# Patient Record
Sex: Female | Born: 1950 | Race: Black or African American | Hispanic: No | Marital: Single | Smoking: Never smoker
Health system: Southern US, Community
[De-identification: ages and names within clinical notes are randomized; demographics above are authoritative.]

## PROBLEM LIST (undated history)

## (undated) VITALS — BP 149/104 | HR 100 | Temp 97.7°F | Resp 18 | Ht 67.0 in | Wt 263.0 lb

## (undated) DIAGNOSIS — E079 Disorder of thyroid, unspecified: Secondary | ICD-10-CM

## (undated) DIAGNOSIS — R32 Unspecified urinary incontinence: Secondary | ICD-10-CM

## (undated) DIAGNOSIS — D649 Anemia, unspecified: Secondary | ICD-10-CM

## (undated) DIAGNOSIS — J45909 Unspecified asthma, uncomplicated: Secondary | ICD-10-CM

## (undated) DIAGNOSIS — C50319 Malignant neoplasm of lower-inner quadrant of unspecified female breast: Secondary | ICD-10-CM

## (undated) DIAGNOSIS — Z8701 Personal history of pneumonia (recurrent): Secondary | ICD-10-CM

## (undated) DIAGNOSIS — M797 Fibromyalgia: Secondary | ICD-10-CM

## (undated) DIAGNOSIS — F319 Bipolar disorder, unspecified: Secondary | ICD-10-CM

## (undated) DIAGNOSIS — Z8709 Personal history of other diseases of the respiratory system: Secondary | ICD-10-CM

## (undated) DIAGNOSIS — E785 Hyperlipidemia, unspecified: Secondary | ICD-10-CM

## (undated) DIAGNOSIS — F329 Major depressive disorder, single episode, unspecified: Secondary | ICD-10-CM

## (undated) DIAGNOSIS — Z923 Personal history of irradiation: Secondary | ICD-10-CM

## (undated) DIAGNOSIS — G039 Meningitis, unspecified: Secondary | ICD-10-CM

## (undated) DIAGNOSIS — R350 Frequency of micturition: Secondary | ICD-10-CM

## (undated) DIAGNOSIS — F32A Depression, unspecified: Secondary | ICD-10-CM

## (undated) DIAGNOSIS — G4730 Sleep apnea, unspecified: Secondary | ICD-10-CM

## (undated) DIAGNOSIS — R3915 Urgency of urination: Secondary | ICD-10-CM

## (undated) DIAGNOSIS — G5600 Carpal tunnel syndrome, unspecified upper limb: Secondary | ICD-10-CM

## (undated) DIAGNOSIS — T7840XA Allergy, unspecified, initial encounter: Secondary | ICD-10-CM

## (undated) DIAGNOSIS — F419 Anxiety disorder, unspecified: Secondary | ICD-10-CM

## (undated) DIAGNOSIS — F4310 Post-traumatic stress disorder, unspecified: Secondary | ICD-10-CM

## (undated) DIAGNOSIS — I10 Essential (primary) hypertension: Secondary | ICD-10-CM

## (undated) DIAGNOSIS — M419 Scoliosis, unspecified: Secondary | ICD-10-CM

## (undated) DIAGNOSIS — K219 Gastro-esophageal reflux disease without esophagitis: Secondary | ICD-10-CM

## (undated) DIAGNOSIS — G5603 Carpal tunnel syndrome, bilateral upper limbs: Secondary | ICD-10-CM

## (undated) DIAGNOSIS — M5020 Other cervical disc displacement, unspecified cervical region: Secondary | ICD-10-CM

## (undated) DIAGNOSIS — Z8619 Personal history of other infectious and parasitic diseases: Secondary | ICD-10-CM

## (undated) DIAGNOSIS — M1990 Unspecified osteoarthritis, unspecified site: Secondary | ICD-10-CM

## (undated) HISTORY — DX: Personal history of other infectious and parasitic diseases: Z86.19

## (undated) HISTORY — PX: TUBAL LIGATION: SHX77

## (undated) HISTORY — DX: Major depressive disorder, single episode, unspecified: F32.9

## (undated) HISTORY — DX: Malignant neoplasm of lower-inner quadrant of unspecified female breast: C50.319

## (undated) HISTORY — PX: ABDOMINAL HYSTERECTOMY: SHX81

## (undated) HISTORY — PX: DILATION AND CURETTAGE OF UTERUS: SHX78

## (undated) HISTORY — DX: Scoliosis, unspecified: M41.9

## (undated) HISTORY — DX: Depression, unspecified: F32.A

## (undated) HISTORY — DX: Unspecified asthma, uncomplicated: J45.909

## (undated) HISTORY — PX: COLONOSCOPY: SHX174

## (undated) HISTORY — DX: Allergy, unspecified, initial encounter: T78.40XA

## (undated) HISTORY — PX: BREAST LUMPECTOMY: SHX2

## (undated) HISTORY — DX: Unspecified urinary incontinence: R32

## (undated) HISTORY — DX: Carpal tunnel syndrome, unspecified upper limb: G56.00

## (undated) HISTORY — DX: Hyperlipidemia, unspecified: E78.5

---

## 1994-03-07 DIAGNOSIS — Z8701 Personal history of pneumonia (recurrent): Secondary | ICD-10-CM

## 1994-03-07 HISTORY — DX: Personal history of pneumonia (recurrent): Z87.01

## 2007-11-06 ENCOUNTER — Emergency Department (HOSPITAL_COMMUNITY): Admission: EM | Admit: 2007-11-06 | Discharge: 2007-11-06 | Payer: Self-pay | Admitting: Emergency Medicine

## 2007-11-19 ENCOUNTER — Encounter: Admission: RE | Admit: 2007-11-19 | Discharge: 2007-11-19 | Payer: Self-pay | Admitting: Internal Medicine

## 2008-07-11 ENCOUNTER — Ambulatory Visit (HOSPITAL_COMMUNITY): Payer: Self-pay | Admitting: Licensed Clinical Social Worker

## 2008-07-30 ENCOUNTER — Ambulatory Visit (HOSPITAL_COMMUNITY): Payer: Self-pay | Admitting: Licensed Clinical Social Worker

## 2008-08-07 ENCOUNTER — Ambulatory Visit (HOSPITAL_COMMUNITY): Payer: Self-pay | Admitting: Licensed Clinical Social Worker

## 2008-08-12 ENCOUNTER — Ambulatory Visit (HOSPITAL_COMMUNITY): Payer: Self-pay | Admitting: Licensed Clinical Social Worker

## 2008-08-29 ENCOUNTER — Ambulatory Visit (HOSPITAL_COMMUNITY): Payer: Self-pay | Admitting: Psychiatry

## 2008-09-08 ENCOUNTER — Ambulatory Visit (HOSPITAL_COMMUNITY): Payer: Self-pay | Admitting: Licensed Clinical Social Worker

## 2008-09-17 ENCOUNTER — Ambulatory Visit (HOSPITAL_COMMUNITY): Payer: Self-pay | Admitting: Licensed Clinical Social Worker

## 2008-10-09 ENCOUNTER — Ambulatory Visit (HOSPITAL_COMMUNITY): Payer: Self-pay | Admitting: Licensed Clinical Social Worker

## 2008-10-16 ENCOUNTER — Ambulatory Visit (HOSPITAL_COMMUNITY): Payer: Self-pay | Admitting: Licensed Clinical Social Worker

## 2008-10-23 ENCOUNTER — Ambulatory Visit (HOSPITAL_COMMUNITY): Payer: Self-pay | Admitting: Licensed Clinical Social Worker

## 2008-10-27 ENCOUNTER — Ambulatory Visit (HOSPITAL_COMMUNITY): Payer: Self-pay | Admitting: Psychiatry

## 2008-10-30 ENCOUNTER — Ambulatory Visit (HOSPITAL_COMMUNITY): Payer: Self-pay | Admitting: Licensed Clinical Social Worker

## 2008-11-13 ENCOUNTER — Ambulatory Visit (HOSPITAL_COMMUNITY): Payer: Self-pay | Admitting: Licensed Clinical Social Worker

## 2008-11-24 ENCOUNTER — Ambulatory Visit (HOSPITAL_COMMUNITY): Payer: Self-pay | Admitting: Psychiatry

## 2008-11-27 ENCOUNTER — Ambulatory Visit (HOSPITAL_COMMUNITY): Payer: Self-pay | Admitting: Psychiatry

## 2008-12-26 ENCOUNTER — Inpatient Hospital Stay (HOSPITAL_COMMUNITY): Admission: AD | Admit: 2008-12-26 | Discharge: 2009-01-02 | Payer: Self-pay | Admitting: Psychiatry

## 2008-12-26 ENCOUNTER — Ambulatory Visit: Payer: Self-pay | Admitting: Psychiatry

## 2008-12-26 ENCOUNTER — Ambulatory Visit (HOSPITAL_COMMUNITY): Payer: Self-pay | Admitting: Licensed Clinical Social Worker

## 2009-01-16 ENCOUNTER — Ambulatory Visit (HOSPITAL_COMMUNITY): Payer: Self-pay | Admitting: Licensed Clinical Social Worker

## 2009-02-12 ENCOUNTER — Ambulatory Visit (HOSPITAL_COMMUNITY): Payer: Self-pay | Admitting: Licensed Clinical Social Worker

## 2009-05-22 ENCOUNTER — Ambulatory Visit (HOSPITAL_COMMUNITY): Payer: Self-pay | Admitting: Licensed Clinical Social Worker

## 2009-06-01 ENCOUNTER — Ambulatory Visit (HOSPITAL_COMMUNITY): Payer: Self-pay | Admitting: Psychiatry

## 2009-06-15 ENCOUNTER — Ambulatory Visit (HOSPITAL_COMMUNITY): Payer: Self-pay | Admitting: Licensed Clinical Social Worker

## 2009-07-16 ENCOUNTER — Ambulatory Visit (HOSPITAL_COMMUNITY): Payer: Self-pay | Admitting: Licensed Clinical Social Worker

## 2009-07-30 ENCOUNTER — Ambulatory Visit (HOSPITAL_COMMUNITY): Payer: Self-pay | Admitting: Licensed Clinical Social Worker

## 2009-09-30 ENCOUNTER — Ambulatory Visit (HOSPITAL_COMMUNITY): Payer: Self-pay | Admitting: Licensed Clinical Social Worker

## 2009-10-15 ENCOUNTER — Ambulatory Visit (HOSPITAL_COMMUNITY): Payer: Self-pay | Admitting: Licensed Clinical Social Worker

## 2009-10-30 ENCOUNTER — Ambulatory Visit (HOSPITAL_COMMUNITY): Payer: Self-pay | Admitting: Licensed Clinical Social Worker

## 2009-11-20 ENCOUNTER — Ambulatory Visit (HOSPITAL_COMMUNITY): Payer: Self-pay | Admitting: Licensed Clinical Social Worker

## 2009-11-25 ENCOUNTER — Ambulatory Visit (HOSPITAL_COMMUNITY): Payer: Self-pay | Admitting: Licensed Clinical Social Worker

## 2009-12-11 ENCOUNTER — Ambulatory Visit (HOSPITAL_COMMUNITY): Payer: Self-pay | Admitting: Licensed Clinical Social Worker

## 2009-12-25 ENCOUNTER — Ambulatory Visit (HOSPITAL_COMMUNITY): Payer: Self-pay | Admitting: Licensed Clinical Social Worker

## 2010-01-08 ENCOUNTER — Ambulatory Visit (HOSPITAL_COMMUNITY): Payer: Self-pay | Admitting: Licensed Clinical Social Worker

## 2010-01-15 ENCOUNTER — Ambulatory Visit (HOSPITAL_COMMUNITY): Payer: Self-pay | Admitting: Licensed Clinical Social Worker

## 2010-02-05 ENCOUNTER — Ambulatory Visit (HOSPITAL_COMMUNITY): Payer: Self-pay | Admitting: Licensed Clinical Social Worker

## 2010-02-19 ENCOUNTER — Ambulatory Visit (HOSPITAL_COMMUNITY): Payer: Self-pay | Admitting: Licensed Clinical Social Worker

## 2010-02-24 ENCOUNTER — Ambulatory Visit (HOSPITAL_COMMUNITY): Payer: Self-pay | Admitting: Licensed Clinical Social Worker

## 2010-03-12 ENCOUNTER — Ambulatory Visit (HOSPITAL_COMMUNITY): Admit: 2010-03-12 | Payer: Self-pay | Admitting: Licensed Clinical Social Worker

## 2010-03-19 ENCOUNTER — Ambulatory Visit (HOSPITAL_COMMUNITY)
Admission: RE | Admit: 2010-03-19 | Discharge: 2010-03-19 | Payer: Self-pay | Source: Home / Self Care | Attending: Licensed Clinical Social Worker | Admitting: Licensed Clinical Social Worker

## 2010-04-02 ENCOUNTER — Ambulatory Visit (HOSPITAL_COMMUNITY): Admit: 2010-04-02 | Payer: Self-pay | Admitting: Licensed Clinical Social Worker

## 2010-04-09 ENCOUNTER — Encounter (HOSPITAL_COMMUNITY): Payer: 59 | Admitting: Licensed Clinical Social Worker

## 2010-04-09 DIAGNOSIS — F315 Bipolar disorder, current episode depressed, severe, with psychotic features: Secondary | ICD-10-CM

## 2010-04-23 ENCOUNTER — Encounter (HOSPITAL_COMMUNITY): Payer: 59 | Admitting: Licensed Clinical Social Worker

## 2010-04-23 DIAGNOSIS — F315 Bipolar disorder, current episode depressed, severe, with psychotic features: Secondary | ICD-10-CM

## 2010-04-30 ENCOUNTER — Encounter (HOSPITAL_BASED_OUTPATIENT_CLINIC_OR_DEPARTMENT_OTHER): Payer: 59 | Admitting: Licensed Clinical Social Worker

## 2010-04-30 DIAGNOSIS — F315 Bipolar disorder, current episode depressed, severe, with psychotic features: Secondary | ICD-10-CM

## 2010-05-12 ENCOUNTER — Encounter (HOSPITAL_COMMUNITY): Payer: 59 | Admitting: Licensed Clinical Social Worker

## 2010-06-10 LAB — COMPREHENSIVE METABOLIC PANEL
ALT: 15 U/L (ref 0–35)
AST: 16 U/L (ref 0–37)
Albumin: 3.6 g/dL (ref 3.5–5.2)
CO2: 26 mEq/L (ref 19–32)
Calcium: 9.3 mg/dL (ref 8.4–10.5)
Chloride: 110 mEq/L (ref 96–112)
Creatinine, Ser: 0.86 mg/dL (ref 0.4–1.2)
GFR calc Af Amer: >60 mL/min (ref >60)
Sodium: 141 mEq/L (ref 135–145)

## 2010-06-10 LAB — CBC
MCHC: 32.3 g/dL (ref 30.0–36.0)
MCV: 88.7 fL (ref 78.0–100.0)
Platelets: 337 10*3/uL (ref 150–400)
RBC: 3.91 MIL/uL (ref 3.87–5.11)
WBC: 8.5 10*3/uL (ref 4.0–10.5)

## 2010-06-10 LAB — LITHIUM LEVEL: Lithium Lvl: 1.27 mEq/L (ref 0.80–1.40)

## 2010-07-23 ENCOUNTER — Encounter (HOSPITAL_COMMUNITY): Payer: 59 | Admitting: Licensed Clinical Social Worker

## 2010-07-23 DIAGNOSIS — F315 Bipolar disorder, current episode depressed, severe, with psychotic features: Secondary | ICD-10-CM

## 2010-08-23 ENCOUNTER — Encounter (HOSPITAL_COMMUNITY): Payer: 59 | Admitting: Licensed Clinical Social Worker

## 2010-09-17 ENCOUNTER — Encounter (HOSPITAL_COMMUNITY): Payer: 59 | Admitting: Licensed Clinical Social Worker

## 2010-11-17 ENCOUNTER — Encounter (HOSPITAL_COMMUNITY): Payer: 59 | Admitting: Licensed Clinical Social Worker

## 2010-11-17 DIAGNOSIS — F315 Bipolar disorder, current episode depressed, severe, with psychotic features: Secondary | ICD-10-CM

## 2010-12-08 LAB — POCT I-STAT, CHEM 8
Chloride: 110
HCT: 32 — ABNORMAL LOW
Hemoglobin: 10.9 — ABNORMAL LOW
Potassium: 3.9
Sodium: 141

## 2010-12-08 LAB — B-NATRIURETIC PEPTIDE (CONVERTED LAB): Pro B Natriuretic peptide (BNP): <30

## 2010-12-09 ENCOUNTER — Encounter (HOSPITAL_COMMUNITY): Payer: 59 | Admitting: Licensed Clinical Social Worker

## 2010-12-10 ENCOUNTER — Encounter (HOSPITAL_COMMUNITY): Payer: 59 | Admitting: Licensed Clinical Social Worker

## 2010-12-10 DIAGNOSIS — F3164 Bipolar disorder, current episode mixed, severe, with psychotic features: Secondary | ICD-10-CM

## 2010-12-31 ENCOUNTER — Encounter (HOSPITAL_COMMUNITY): Payer: 59 | Admitting: Licensed Clinical Social Worker

## 2011-01-05 ENCOUNTER — Encounter (INDEPENDENT_AMBULATORY_CARE_PROVIDER_SITE_OTHER): Payer: 59 | Admitting: Licensed Clinical Social Worker

## 2011-01-05 DIAGNOSIS — F315 Bipolar disorder, current episode depressed, severe, with psychotic features: Secondary | ICD-10-CM

## 2011-03-08 LAB — HM COLONOSCOPY

## 2011-03-08 LAB — HM PAP SMEAR

## 2011-03-11 ENCOUNTER — Ambulatory Visit (HOSPITAL_COMMUNITY): Payer: 59 | Admitting: Licensed Clinical Social Worker

## 2011-03-25 ENCOUNTER — Ambulatory Visit (HOSPITAL_COMMUNITY): Payer: 59 | Admitting: Licensed Clinical Social Worker

## 2011-04-01 ENCOUNTER — Ambulatory Visit (HOSPITAL_COMMUNITY): Payer: 59 | Admitting: Licensed Clinical Social Worker

## 2011-08-15 ENCOUNTER — Telehealth: Payer: Self-pay | Admitting: Obstetrics and Gynecology

## 2011-08-15 NOTE — Telephone Encounter (Signed)
Triage/cht received/pt last seen in 2010

## 2011-08-16 NOTE — Telephone Encounter (Signed)
TC to pt.   States is having pelvic and vaginal pressure and pain sometimes radiating to back x 1-2 months.    Was seen for eval by PCP 2 weeks ago.  Declines appt until 09/05/11 due to financial concerns.   Declined to speak with someone about payment arrangements.  Sched with Dr AVS 09/05/11.   To call if decides wants sooner appt.

## 2011-09-05 ENCOUNTER — Ambulatory Visit: Payer: Self-pay | Admitting: Obstetrics and Gynecology

## 2011-09-06 ENCOUNTER — Telehealth (HOSPITAL_COMMUNITY): Payer: Self-pay | Admitting: Licensed Clinical Social Worker

## 2011-09-06 NOTE — Telephone Encounter (Deleted)
Returned patients phone call. She called to explain why she has not been in therapy for an extended period of time, due to finances. She is severely depressed, with poor ADL's, increased isolation, hopelessness and helplessness. She endorses suicidal ideation, with a plan to overdose on pills. Recommend inpatient hospitalization, which patient agrees to. Discussed a plan involving her friend bringing her to the ED for evaluation and admission.

## 2011-09-06 NOTE — Telephone Encounter (Signed)
Returned patients phone call. She called to explain that she has not been in therapy due to finances. She is severely depressed, with poor ADL's, increased isolation, hopelessness and helplessness. She endorses suicidal ideation, with a plan to overdose on pills. Recommend inpatient hospitalization, which patient agrees to. Patients friend will bring patient to [facility] ED for evaluation and admission.

## 2011-09-07 ENCOUNTER — Telehealth (HOSPITAL_COMMUNITY): Payer: Self-pay | Admitting: Licensed Clinical Social Worker

## 2011-09-07 NOTE — Telephone Encounter (Signed)
Called patient to follow up. She did not go to the hospital after yesterdays conversation. She reports feeling somewhat better today, with an absence of suicidal ideation. She does still want to be admitted to target her severe depression and plans to come to be assessed on Friday, when a friend can bring her. Advised patient to go to [facility] ED if she is fearful of her own safety. Patient agrees. Advised patient to contact this therapist Friday morning about admission.

## 2011-09-09 ENCOUNTER — Encounter (HOSPITAL_COMMUNITY): Payer: Self-pay | Admitting: *Deleted

## 2011-09-09 ENCOUNTER — Emergency Department (HOSPITAL_COMMUNITY)
Admission: EM | Admit: 2011-09-09 | Discharge: 2011-09-09 | Disposition: A | Discharge status: Other Acute Inpatient Hospital | Payer: Medicare Other | Attending: Emergency Medicine | Admitting: Emergency Medicine

## 2011-09-09 ENCOUNTER — Inpatient Hospital Stay (HOSPITAL_COMMUNITY)
Admission: EM | Admit: 2011-09-09 | Discharge: 2011-09-17 | DRG: 885 | Disposition: A | Discharge status: Home / Self Care | Payer: 59 | Source: Ambulatory Visit | Attending: Psychiatry | Admitting: Psychiatry

## 2011-09-09 DIAGNOSIS — F259 Schizoaffective disorder, unspecified: Secondary | ICD-10-CM | POA: Diagnosis present

## 2011-09-09 DIAGNOSIS — Z79899 Other long term (current) drug therapy: Secondary | ICD-10-CM | POA: Insufficient documentation

## 2011-09-09 DIAGNOSIS — I10 Essential (primary) hypertension: Secondary | ICD-10-CM | POA: Insufficient documentation

## 2011-09-09 DIAGNOSIS — E079 Disorder of thyroid, unspecified: Secondary | ICD-10-CM | POA: Insufficient documentation

## 2011-09-09 DIAGNOSIS — F315 Bipolar disorder, current episode depressed, severe, with psychotic features: Principal | ICD-10-CM | POA: Diagnosis present

## 2011-09-09 DIAGNOSIS — M2550 Pain in unspecified joint: Secondary | ICD-10-CM | POA: Insufficient documentation

## 2011-09-09 DIAGNOSIS — F329 Major depressive disorder, single episode, unspecified: Secondary | ICD-10-CM | POA: Insufficient documentation

## 2011-09-09 DIAGNOSIS — E669 Obesity, unspecified: Secondary | ICD-10-CM | POA: Insufficient documentation

## 2011-09-09 DIAGNOSIS — F489 Nonpsychotic mental disorder, unspecified: Secondary | ICD-10-CM | POA: Diagnosis present

## 2011-09-09 DIAGNOSIS — G8929 Other chronic pain: Secondary | ICD-10-CM | POA: Insufficient documentation

## 2011-09-09 DIAGNOSIS — F4310 Post-traumatic stress disorder, unspecified: Secondary | ICD-10-CM

## 2011-09-09 DIAGNOSIS — F411 Generalized anxiety disorder: Secondary | ICD-10-CM | POA: Diagnosis present

## 2011-09-09 DIAGNOSIS — F3130 Bipolar disorder, current episode depressed, mild or moderate severity, unspecified: Secondary | ICD-10-CM

## 2011-09-09 DIAGNOSIS — IMO0001 Reserved for inherently not codable concepts without codable children: Secondary | ICD-10-CM | POA: Insufficient documentation

## 2011-09-09 DIAGNOSIS — M129 Arthropathy, unspecified: Secondary | ICD-10-CM | POA: Insufficient documentation

## 2011-09-09 DIAGNOSIS — IMO0002 Reserved for concepts with insufficient information to code with codable children: Secondary | ICD-10-CM

## 2011-09-09 DIAGNOSIS — F312 Bipolar disorder, current episode manic severe with psychotic features: Secondary | ICD-10-CM | POA: Diagnosis present

## 2011-09-09 DIAGNOSIS — F3289 Other specified depressive episodes: Secondary | ICD-10-CM | POA: Insufficient documentation

## 2011-09-09 DIAGNOSIS — R45851 Suicidal ideations: Secondary | ICD-10-CM | POA: Insufficient documentation

## 2011-09-09 HISTORY — DX: Carpal tunnel syndrome, bilateral upper limbs: G56.03

## 2011-09-09 HISTORY — DX: Post-traumatic stress disorder, unspecified: F43.10

## 2011-09-09 HISTORY — DX: Essential (primary) hypertension: I10

## 2011-09-09 HISTORY — DX: Anxiety disorder, unspecified: F41.9

## 2011-09-09 HISTORY — DX: Unspecified osteoarthritis, unspecified site: M19.90

## 2011-09-09 HISTORY — DX: Bipolar disorder, unspecified: F31.9

## 2011-09-09 HISTORY — DX: Fibromyalgia: M79.7

## 2011-09-09 HISTORY — DX: Disorder of thyroid, unspecified: E07.9

## 2011-09-09 LAB — RAPID URINE DRUG SCREEN, HOSP PERFORMED
Barbiturates: NOT DETECTED
Cocaine: NOT DETECTED
Tetrahydrocannabinol: NOT DETECTED

## 2011-09-09 LAB — COMPREHENSIVE METABOLIC PANEL
ALT: 10 U/L (ref 0–35)
Alkaline Phosphatase: 109 U/L (ref 39–117)
CO2: 26 mEq/L (ref 19–32)
Chloride: 108 mEq/L (ref 96–112)
GFR calc Af Amer: >90 mL/min (ref >90)
Glucose, Bld: 92 mg/dL (ref 70–99)
Potassium: 3.5 mEq/L (ref 3.5–5.1)
Sodium: 142 mEq/L (ref 135–145)
Total Bilirubin: 0.2 mg/dL — ABNORMAL LOW (ref 0.3–1.2)
Total Protein: 7.7 g/dL (ref 6.0–8.3)

## 2011-09-09 LAB — CBC
HCT: 33.1 % — ABNORMAL LOW (ref 36.0–46.0)
Hemoglobin: 10.3 g/dL — ABNORMAL LOW (ref 12.0–15.0)
RBC: 3.81 MIL/uL — ABNORMAL LOW (ref 3.87–5.11)
WBC: 7 10*3/uL (ref 4.0–10.5)

## 2011-09-09 LAB — LITHIUM LEVEL: Lithium Lvl: 0.37 mEq/L — ABNORMAL LOW (ref 0.80–1.40)

## 2011-09-09 MED ORDER — LORAZEPAM 1 MG PO TABS: 1.0000 mg | ORAL_TABLET | Freq: Three times a day (TID) | ORAL | Status: DC | PRN

## 2011-09-09 MED ORDER — IBUPROFEN 200 MG PO TABS: 600.0000 mg | ORAL_TABLET | Freq: Three times a day (TID) | ORAL | Status: DC | PRN

## 2011-09-09 MED ORDER — ONDANSETRON HCL 4 MG PO TABS: 4.0000 mg | ORAL_TABLET | Freq: Three times a day (TID) | ORAL | Status: DC | PRN

## 2011-09-09 MED ORDER — AMLODIPINE BESYLATE 10 MG PO TABS
10.0000 mg | ORAL_TABLET | Freq: Every day | ORAL | Status: DC
  Administered 2011-09-09: 10 mg via ORAL
  Filled 2011-09-09: qty 1

## 2011-09-09 MED ORDER — LITHIUM CARBONATE 300 MG PO CAPS
600.0000 mg | ORAL_CAPSULE | Freq: Two times a day (BID) | ORAL | Status: DC
  Administered 2011-09-09 (×2): 600 mg via ORAL
  Filled 2011-09-09 (×2): qty 2

## 2011-09-09 MED ORDER — PANTOPRAZOLE SODIUM 40 MG PO TBEC
40.0000 mg | DELAYED_RELEASE_TABLET | Freq: Every day | ORAL | Status: DC
  Administered 2011-09-09: 40 mg via ORAL
  Filled 2011-09-09: qty 1

## 2011-09-09 MED ORDER — TRAMADOL HCL 50 MG PO TABS: 50.0000 mg | ORAL_TABLET | Freq: Four times a day (QID) | ORAL | Status: DC | PRN

## 2011-09-09 MED ORDER — ZOLPIDEM TARTRATE 5 MG PO TABS: 5.0000 mg | ORAL_TABLET | Freq: Every evening | ORAL | Status: DC | PRN

## 2011-09-09 NOTE — ED Notes (Signed)
Pt friend [name] would like to be called as needed- [phone] or cell [phone]

## 2011-09-09 NOTE — ED Notes (Signed)
Patient discharge to [facility] with security and nurse tech [place] via wheelchair with cane. Respirations equal and unlabored. Skin warm and dry. No acute distress noted.

## 2011-09-09 NOTE — ED Notes (Signed)
Sitter at bedside.

## 2011-09-09 NOTE — ED Notes (Signed)
Pt reports history of bipolar disorder, PTSD and anxiety. Pt reports psychiatric issues worse in the last two months. Pt reports she felt manic about three weeks ago and has since felt depressed. Pt sees PCP at [facility], but has not seen them in several months secondary to financial issues. Pt sees MD [place] Dragon for psychiatry, but has been unable to see her as she has financial issues. Patient was taken off Cymbalta and Lyrica secondary to a reaction several months ago.  Pt reports she believes that is when her psychiatric issues became worse.  Pt reports SI/HI.  Pt reports she has a plan- patient is going to clean the house, write a letter to friends and family and then to overdose on pills.

## 2011-09-09 NOTE — Progress Notes (Signed)
Black purse x1 placed in locker #36 for pt/family member per CNA

## 2011-09-09 NOTE — ED Provider Notes (Addendum)
History     CSN: [id number]  Arrival date & time 09/09/11  1152   First MD Initiated Contact with Patient 09/09/11 1258      Chief Complaint  Patient presents with  . Medical Clearance  . Suicidal    (Consider location/radiation/quality/duration/timing/severity/associated sxs/prior treatment) HPI Comments: Pt is a 61 yo female who presents to ED with complaints of depression, anxiety, SI and HI. States does not have a plan for either. States cannot control her emotions. Pt states she spoke with her therapist who is working on getting to to [employer]. Pt denies drugs or alcohol. Reports chronic pain, for which she takes tramadol for. No other complaints. States has been out of some of her medications for few months because unable to pay. Does not volunteer why she is feeling more depressed, states "just a lot going on."    Past Medical History  Diagnosis Date  . PTSD (post-traumatic stress disorder)   . Anxiety   . Fibromyalgia   . Bipolar disorder   . Arthritis   . Hypertension   . Thyroid disease     Past Surgical History  Procedure Date  . Tubal ligation   . Abdominal hysterectomy   . Breast lumpectomy     No family history on file.  History  Substance Use Topics  . Smoking status: Never Smoker   . Smokeless tobacco: Not on file  . Alcohol Use: No    OB History    Grav Para Term Preterm Abortions TAB SAB Ect Mult Living                  Review of Systems  Constitutional: Negative for fever and chills.  Respiratory: Negative.   Cardiovascular: Negative.   Gastrointestinal: Negative.   Genitourinary: Negative for dysuria.  Musculoskeletal: Positive for myalgias and arthralgias.  Skin: Negative.   Neurological: Negative for weakness and numbness.  Psychiatric/Behavioral: Positive for suicidal ideas. The patient is nervous/anxious.        Positive for depression    Allergies  Ace inhibitors and Decongestant  Home Medications   Current Outpatient Rx   Name Route Sig Dispense Refill  . ALBUTEROL SULFATE HFA 108 (90 BASE) MCG/ACT IN AERS Inhalation Inhale 2 puffs into the lungs daily.    Marland Kitchen AMLODIPINE BESYLATE 10 MG PO TABS Oral Take 10 mg by mouth daily.    . ASENAPINE MALEATE 5 MG SL SUBL Sublingual Place 5 mg under the tongue daily.    . CHOLINE FENOFIBRATE 135 MG PO CPDR Oral Take 135 mg by mouth daily.    Marland Kitchen DIAZEPAM 10 MG PO TABS Oral Take 10 mg by mouth every 6 (six) hours as needed. For anxiety    . DICLOFENAC SODIUM 1.5 % TD SOLN Transdermal Place 1 drop onto the skin 2 (two) times daily. Apply to knees, back, and hip    . ESOMEPRAZOLE MAGNESIUM 40 MG PO CPDR Oral Take 40 mg by mouth daily before breakfast.    . FEXOFENADINE HCL 180 MG PO TABS Oral Take 180 mg by mouth daily.    Marland Kitchen FLUTICASONE PROPIONATE 50 MCG/ACT NA SUSP Nasal Place 2 sprays into the nose daily.    Marland Kitchen LITHIUM CARBONATE 300 MG PO TABS Oral Take 600 mg by mouth 2 (two) times daily.    Marland Kitchen SOLIFENACIN SUCCINATE 5 MG PO TABS Oral Take 10 mg by mouth daily.    . TRAMADOL HCL 50 MG PO TABS Oral Take 50 mg by mouth every 6 (  six) hours as needed. For pain      BP 150/83  Pulse 83  Temp 98.5 F (36.9 C) (Oral)  Resp 18  SpO2 98%  Physical Exam  Nursing note and vitals reviewed. Constitutional: She is oriented to person, place, and time. She appears well-developed and well-nourished. No distress.       obese  HENT:  Head: Normocephalic and atraumatic.  Eyes: Conjunctivae are normal.  Neck: Neck supple.  Cardiovascular: Normal rate, regular rhythm and normal heart sounds.   Pulmonary/Chest: Effort normal and breath sounds normal. No respiratory distress. She has no wheezes. She has no rales.  Abdominal: Soft. Bowel sounds are normal. She exhibits no distension. There is no tenderness.  Musculoskeletal: Normal range of motion.  Neurological: She is alert and oriented to person, place, and time. No cranial nerve deficit. Coordination normal.  Skin: Skin is warm and dry.   Psychiatric: Her behavior is normal. Thought content normal.       Calm, cooperative, flat affect    ED Course  Procedures (including critical care time)  Pt with SI and some HI. Depression. Hx of PTSD, anxiety, bipolar. Will get labs, will monitor.   Results for orders placed during the hospital encounter of 09/09/11  CBC      Component Value Range   WBC 7.0  4.0 - 10.5 K/uL   RBC 3.81 (*) 3.87 - 5.11 MIL/uL   Hemoglobin 10.3 (*) 12.0 - 15.0 g/dL   HCT 16.1 (*) 09.6 - 04.5 %   MCV 86.9  78.0 - 100.0 fL   MCH 27.0  26.0 - 34.0 pg   MCHC 31.1  30.0 - 36.0 g/dL   RDW 40.9  81.1 - 91.4 %   Platelets 330  150 - 400 K/uL  COMPREHENSIVE METABOLIC PANEL      Component Value Range   Sodium 142  135 - 145 mEq/L   Potassium 3.5  3.5 - 5.1 mEq/L   Chloride 108  96 - 112 mEq/L   CO2 26  19 - 32 mEq/L   Glucose, Bld 92  70 - 99 mg/dL   BUN 16  6 - 23 mg/dL   Creatinine, Ser 7.82  0.50 - 1.10 mg/dL   Calcium 9.5  8.4 - 95.6 mg/dL   Total Protein 7.7  6.0 - 8.3 g/dL   Albumin 3.5  3.5 - 5.2 g/dL   AST 14  0 - 37 U/L   ALT 10  0 - 35 U/L   Alkaline Phosphatase 109  39 - 117 U/L   Total Bilirubin 0.2 (*) 0.3 - 1.2 mg/dL   GFR calc non Af Amer 79 (*) >90 mL/min   GFR calc Af Amer >90  >90 mL/min  ETHANOL      Component Value Range   Alcohol, Ethyl (B) <11  0 - 11 mg/dL  ACETAMINOPHEN LEVEL      Component Value Range   Acetaminophen (Tylenol), Serum <15.0  10 - 30 ug/mL  URINE RAPID DRUG SCREEN (HOSP PERFORMED)      Component Value Range   Opiates NONE DETECTED  NONE DETECTED   Cocaine NONE DETECTED  NONE DETECTED   Benzodiazepines POSITIVE (*) NONE DETECTED   Amphetamines NONE DETECTED  NONE DETECTED   Tetrahydrocannabinol NONE DETECTED  NONE DETECTED   Barbiturates NONE DETECTED  NONE DETECTED  LITHIUM LEVEL      Component Value Range   Lithium Lvl 0.37 (*) 0.80 - 1.40 mEq/L   3:54 PM Pt  medically cleared. I spoke with ACT team. Will come assess. Her VS are stable, she is  currently nad, SI present.    Date: 11/26/2011  Rate: 80  Rhythm: normal sinus rhythm  QRS Axis: left  Intervals: normal  ST/T Wave abnormalities: normal  Conduction Disutrbances:nonspecific intraventricular conduction delay  Narrative Interpretation:   Old EKG Reviewed: none available     1. Depression   2. Suicidal ideations       MDM          Lottie Mussel, PA 09/10/11 1513  Lottie Mussel, [place] 11/26/11 1338

## 2011-09-09 NOTE — ED Notes (Signed)
Safety sitter at bedside 
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
## 2011-09-10 ENCOUNTER — Encounter (HOSPITAL_COMMUNITY): Payer: Self-pay

## 2011-09-10 DIAGNOSIS — F1994 Other psychoactive substance use, unspecified with psychoactive substance-induced mood disorder: Secondary | ICD-10-CM

## 2011-09-10 DIAGNOSIS — F070 Personality change due to known physiological condition: Secondary | ICD-10-CM

## 2011-09-10 DIAGNOSIS — F4310 Post-traumatic stress disorder, unspecified: Secondary | ICD-10-CM

## 2011-09-10 DIAGNOSIS — F3130 Bipolar disorder, current episode depressed, mild or moderate severity, unspecified: Secondary | ICD-10-CM

## 2011-09-10 DIAGNOSIS — F259 Schizoaffective disorder, unspecified: Secondary | ICD-10-CM

## 2011-09-10 MED ORDER — ASENAPINE MALEATE 5 MG SL SUBL: 5.0000 mg | SUBLINGUAL_TABLET | SUBLINGUAL | Status: DC | PRN

## 2011-09-10 MED ORDER — PANTOPRAZOLE SODIUM 40 MG PO TBEC
40.0000 mg | DELAYED_RELEASE_TABLET | Freq: Every day | ORAL | Status: DC
  Administered 2011-09-10 – 2011-09-12 (×3): 40 mg via ORAL
  Filled 2011-09-10 (×4): qty 1

## 2011-09-10 MED ORDER — DULOXETINE HCL 20 MG PO CPEP
20.0000 mg | ORAL_CAPSULE | Freq: Two times a day (BID) | ORAL | Status: DC
  Administered 2011-09-10 – 2011-09-12 (×4): 20 mg via ORAL
  Filled 2011-09-10 (×6): qty 1

## 2011-09-10 MED ORDER — DARIFENACIN HYDROBROMIDE ER 7.5 MG PO TB24
7.5000 mg | ORAL_TABLET | Freq: Every day | ORAL | Status: DC
  Administered 2011-09-10 – 2011-09-17 (×8): 7.5 mg via ORAL
  Filled 2011-09-10 (×10): qty 1

## 2011-09-10 MED ORDER — ALBUTEROL SULFATE HFA 108 (90 BASE) MCG/ACT IN AERS
2.0000 | INHALATION_SPRAY | Freq: Every day | RESPIRATORY_TRACT | Status: DC
  Administered 2011-09-10 – 2011-09-17 (×6): 2 via RESPIRATORY_TRACT
  Filled 2011-09-10: qty 6.7

## 2011-09-10 MED ORDER — OLANZAPINE 10 MG PO TABS
10.0000 mg | ORAL_TABLET | Freq: Every day | ORAL | Status: DC
  Administered 2011-09-10 – 2011-09-16 (×6): 10 mg via ORAL
  Filled 2011-09-10 (×3): qty 1
  Filled 2011-09-10: qty 3
  Filled 2011-09-10 (×6): qty 1

## 2011-09-10 MED ORDER — LORATADINE 10 MG PO TABS
10.0000 mg | ORAL_TABLET | Freq: Every day | ORAL | Status: DC
  Administered 2011-09-10 – 2011-09-17 (×8): 10 mg via ORAL
  Filled 2011-09-10 (×10): qty 1

## 2011-09-10 MED ORDER — ACETAMINOPHEN 325 MG PO TABS
650.0000 mg | ORAL_TABLET | Freq: Four times a day (QID) | ORAL | Status: DC | PRN
  Administered 2011-09-11 – 2011-09-12 (×2): 650 mg via ORAL
  Filled 2011-09-10: qty 2

## 2011-09-10 MED ORDER — AMLODIPINE BESYLATE 10 MG PO TABS
10.0000 mg | ORAL_TABLET | Freq: Every day | ORAL | Status: DC
  Administered 2011-09-10 – 2011-09-17 (×8): 10 mg via ORAL
  Filled 2011-09-10 (×3): qty 1
  Filled 2011-09-10: qty 2
  Filled 2011-09-10 (×2): qty 1
  Filled 2011-09-10: qty 2
  Filled 2011-09-10 (×3): qty 1
  Filled 2011-09-10: qty 3
  Filled 2011-09-10: qty 1

## 2011-09-10 MED ORDER — MELOXICAM 7.5 MG PO TABS
7.5000 mg | ORAL_TABLET | Freq: Two times a day (BID) | ORAL | Status: DC
  Administered 2011-09-10 – 2011-09-17 (×15): 7.5 mg via ORAL
  Filled 2011-09-10 (×4): qty 1
  Filled 2011-09-10: qty 6
  Filled 2011-09-10 (×6): qty 1
  Filled 2011-09-10: qty 6
  Filled 2011-09-10 (×7): qty 1

## 2011-09-10 MED ORDER — MELOXICAM 7.5 MG PO TABS: 7.5000 mg | ORAL_TABLET | Freq: Every day | ORAL | Status: DC

## 2011-09-10 MED ORDER — GABAPENTIN 100 MG PO CAPS
200.0000 mg | ORAL_CAPSULE | Freq: Four times a day (QID) | ORAL | Status: DC
  Administered 2011-09-10 – 2011-09-12 (×7): 200 mg via ORAL
  Filled 2011-09-10 (×10): qty 2

## 2011-09-10 MED ORDER — FERROUS SULFATE 325 (65 FE) MG PO TABS
325.0000 mg | ORAL_TABLET | Freq: Two times a day (BID) | ORAL | Status: DC
  Administered 2011-09-10 – 2011-09-12 (×5): 325 mg via ORAL
  Administered 2011-09-13: 324 mg via ORAL
  Administered 2011-09-13 – 2011-09-17 (×8): 325 mg via ORAL
  Filled 2011-09-10 (×9): qty 1
  Filled 2011-09-10: qty 6
  Filled 2011-09-10 (×7): qty 1
  Filled 2011-09-10: qty 6

## 2011-09-10 MED ORDER — FLUTICASONE PROPIONATE 50 MCG/ACT NA SUSP
2.0000 | Freq: Every day | NASAL | Status: DC
  Administered 2011-09-10 – 2011-09-17 (×6): 2 via NASAL
  Filled 2011-09-10: qty 16

## 2011-09-10 MED ORDER — TRAZODONE HCL 100 MG PO TABS
100.0000 mg | ORAL_TABLET | Freq: Every evening | ORAL | Status: DC | PRN
  Administered 2011-09-10: 100 mg via ORAL
  Filled 2011-09-10: qty 3
  Filled 2011-09-10: qty 1

## 2011-09-10 MED ORDER — LITHIUM CARBONATE 300 MG PO CAPS
600.0000 mg | ORAL_CAPSULE | Freq: Two times a day (BID) | ORAL | Status: DC
  Administered 2011-09-10 – 2011-09-15 (×11): 600 mg via ORAL
  Filled 2011-09-10 (×15): qty 2

## 2011-09-10 MED ORDER — GABAPENTIN 100 MG PO CAPS
100.0000 mg | ORAL_CAPSULE | Freq: Four times a day (QID) | ORAL | Status: AC
  Administered 2011-09-10 (×2): 100 mg via ORAL
  Filled 2011-09-10 (×2): qty 1

## 2011-09-10 MED ORDER — PRAZOSIN HCL 1 MG PO CAPS
1.0000 mg | ORAL_CAPSULE | Freq: Every evening | ORAL | Status: DC | PRN
  Administered 2011-09-10 – 2011-09-11 (×2): 1 mg via ORAL
  Filled 2011-09-10 (×6): qty 1

## 2011-09-10 MED ORDER — DIAZEPAM 5 MG PO TABS
10.0000 mg | ORAL_TABLET | ORAL | Status: AC
  Administered 2011-09-10: 10 mg via ORAL
  Filled 2011-09-10: qty 2

## 2011-09-10 MED ORDER — MAGNESIUM HYDROXIDE 400 MG/5ML PO SUSP: 30.0000 mL | Freq: Every day | ORAL | Status: DC | PRN

## 2011-09-10 NOTE — Progress Notes (Signed)
Psychoeducational Group Note  Date:  09/10/2011 Time:  1015  Group Topic/Focus:  Identifying Needs:   The focus of this group is to help patients identify their personal needs that have been historically problematic and identify healthy behaviors to address their needs.  Participation Level:  Did Not Attend  Participation Quality:    Affect:    Cognitive:     Insight:    Engagement in Group:    Additional Comments: Pt was with MD during group. Material and workbook reviewed with pt afterwards.   Candance Bohlman Shari Prows 09/10/2011, 11:13 AM

## 2011-09-10 NOTE — H&P (Signed)
Medical/psychiatric screening examination/treatment/procedure(s) were performed by non-physician practitioner and as supervising physician I was immediately available for consultation/collaboration.  I have seen and examined this patient and agree the major elements of this evaluation.  

## 2011-09-10 NOTE — Progress Notes (Signed)
Pt admitted due to depression and SI. Pt has been having frequent thoughts of SI for the past week. Pt had a plan to OD and to individually write an suicide letter to be given to each one of her daughters and grandchildren. Pt even talked about cleaning her home before OD. Pt said in the past she attempted to OD but her daughter caught her before she could execute her plan. Pt feels abandoned by family. She says they are too busy with their own lives to spend any time with her. Pt has four daughters and several grandchildren. Pt reports two of her daughters as supportive. During our discussion of her PTA medications, pt reports that her physician discontinued her Lyrica and cymbalta due to an allergic reaction. Pt reports swelling as a reaction, with significant swelling of her lips. However pt reported that her physician was to gradually reintroduce these medications . Pt reports that Lyrica and cymbalta where the best medications that ever worked for her. Pt believes that being off of these two meds without the proper replacement has increased her depression. Pt has a PMH of arthritis, HTN, Thyroid disorder (pt unsure of which one, but it is more than likely hypothyroidism), and bilateral carpel tunnel syndrome. Pt is currently using a wheelchair and walker due to unsteady gait r/t her arthritis and fibromyalgia. Pt can walk with a cane, but reports the need for a wheelchair for long distances or frequent walking. Pt is currently passive for SI and contracts for safety. Pt remains safe at this time with q27min checks.
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
## 2011-09-10 NOTE — Progress Notes (Signed)
Psychoeducational Group Note  Date:  09/10/2011 Time:  1515  Group Topic/Focus:  Making Healthy Choices:   The focus of this group is to help patients identify negative/unhealthy choices they were using prior to admission and identify positive/healthier coping strategies to replace them upon discharge.  Participation Level:  Active  Participation Quality:  Appropriate  Affect:  Appropriate  Cognitive:  Appropriate  Insight:  Good  Engagement in Group:  Good  Additional Comments:  none  Samona Chihuahua M 09/10/2011, 5:01 PM

## 2011-09-10 NOTE — Tx Team (Signed)
Initial Interdisciplinary Treatment Plan  PATIENT STRENGTHS: (choose at least two) Ability for insight Average or above average intelligence General fund of knowledge Motivation for treatment/growth Supportive family/friends  PATIENT STRESSORS: Financial difficulties Health problems Loss of 3 friends within the same yr (may-aug 2012)* Medication change or noncompliance The lack of time that other family members are available to spend with this pt.    PROBLEM LIST: Problem List/Patient Goals Date to be addressed Date deferred Reason deferred Estimated date of resolution  Increased risk for SI 7/6     Depression 7/6     Anxiety 7/6     Loss of friends.  Pt reports that her family are to busy with their marriages, kids, and so forth to spend time with her. Pt reporting "loneliness".  7/6                                    DISCHARGE CRITERIA:  Improved stabilization in mood, thinking, and/or behavior Medical problems require only outpatient monitoring Motivation to continue treatment in a less acute level of care Need for constant or close observation no longer present Reduction of life-threatening or endangering symptoms to within safe limits Verbal commitment to aftercare and medication compliance  PRELIMINARY DISCHARGE PLAN: Attend PHP/IOP  PATIENT/FAMIILY INVOLVEMENT: This treatment plan has been presented to and reviewed with the patient, Caitlyn Bautista.  The patient and family have been given the opportunity to ask questions and make suggestions.  Dottie Vaquerano A 09/10/2011, 3:48 AM

## 2011-09-10 NOTE — Progress Notes (Signed)
[facility] Group Notes:  (Counselor/Nursing/MHT/Case Management/Adjunct)  09/10/2011 5:56 PM  Type of Therapy:  Group Therapy  Participation Level:  Active  Participation Quality:  Appropriate and Attentive  Affect:  Appropriate  Cognitive:  Appropriate  Insight:  Good  Engagement in Group:  Good  Engagement in Therapy:  Good  Modes of Intervention:  Clarification, Education, Socialization and Support  Summary of Progress/Problems: Pt. participated in group on self sabotaging behaviors and enabling. Each pt. Shared how they self sabotage and what the word means to them. Each pt. Spoke also discussed enabling them selves or "empowering" them selves to make positive changes in order to avoid their self sabotaging behaviors.  Pt. Spoke about her problems with SI thoughts and not being able to accomplish her goals. Pt. Spoke about life experiences and her struggles with her pain and health issues. Pt. Also spoke about it hard to make changes but spoke about being positive and changing the way she thinks.   Neila Gear 09/10/2011, 5:56 PM

## 2011-09-10 NOTE — Progress Notes (Signed)
D - Pt in dayroom interacting appropriately with other patients. Pt attended group tonight with some interaction, is cooperative with staff. Pt mood is sad, continues to have passive suicidal thoughts, contracts for safety and is open to help offered . Pt was concerned about not getting enough sleep because she wakes up frequently.  A - Medications given as ordered by MD including sleep medications. Encouraged pt to interact with group and to seek answers to any questions or concerns. Offered therapeutic conversation to assist pt with her concerns. Continue Q 15 min rounds for safety.  R - Pt safety maintained on the unit. Pt continues to verbally contract for safety.

## 2011-09-10 NOTE — Progress Notes (Signed)
Patient ID: Caitlyn Bautista, female   DOB: [birth year], 61 y.o.   MRN: [id number] D: Pt is awake and active on the unit this AM. Pt denies A/V hallucinations but endorses passive suicide ideation. Pt is participating in the milieu and is cooperative with staff. Pt is inquiring about support groups for pts with bipolar disorder and ptsd. Pt states that she wakes up frequently during the night.   A: Writer offered self, utilized therapeutic communication and administered medication per MD orders. Writer followed up with counselor for programs and encouraged pt to utilize the Internet for resources. Pt also c/o difficulty with obtaining medications due to cost.   R: Pt is able to verbally contract for safety. Pt writes that she intends to associate more with others and not cut herself off. Pt also wants to learn more about managing her illness. Writer provided medication education. Writer will continue to monitor. 15 minute checks ongoing for safety.
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
## 2011-09-10 NOTE — Progress Notes (Signed)
[facility] MD Progress Note  09/10/2011 10:09 AM  Diagnosis:   Axis I: Bipolar, Depressed, Post Traumatic Stress Disorder, Substance Induced Mood Disorder and Cognitive changes and late onset Bipolar Disorder in person with signinficant arthritities. Axis II: Deferred Axis III:  Past Medical History  Diagnosis Date  . PTSD (post-traumatic stress disorder)   . Anxiety   . Fibromyalgia   . Bipolar disorder   . Arthritis   . Hypertension   . Thyroid disease   . Carpal tunnel syndrome, bilateral    Axis IV: other psychosocial or environmental problems, problems with access to health care services and problems with primary support group Axis V: 41-50 serious symptoms  ADL's:  Impaired  Sleep: Poor  Appetite:  Poor  Suicidal Ideation:  Pt denies any thoughts, plans, intent of suicide this moment, but did have suicidal thoughts when she woke up this AM. Homicidal Ideation:  Pt denies any thoughts, plans, intent of homicide  Mental Status Examination/Evaluation: Objective:  Appearance: Casual  Eye Contact::  Good  Speech:  Clear and Coherent  Volume:  Normal  Mood:  Anxious, Depressed, Hopeless, Irritable and Worthless  Affect:  Congruent  Thought Process:  Coherent  Orientation:  Other:  off from date by one day  Thought Content:  Hallucinations: Auditory feel presence of things in the house at times.  Suicidal Thoughts:  Yes.  without intent/plan  Homicidal Thoughts:  No  Memory:  Immediate;   Poor Recent;   Fair Remote;   Fair  Judgement:  Impaired  Insight:  Fair  Psychomotor Activity:  Normal  Concentration:  Fair  Recall:  Fair  Akathisia:  No  Handed:  Left  AIMS (if indicated):     Assets:  Communication Skills Desire for Improvement  Sleep:  Number of Hours: 3.75    Vital Signs:Blood pressure 133/83, pulse 76, temperature 96.6 F (35.9 C), temperature source Oral, resp. rate 18, height 5\' 7"  (1.702 m), weight 119.296 kg (263 lb). Current Medications: Current  Facility-Administered Medications  Medication Dose Route Frequency Provider Last Rate Last Dose  . acetaminophen (TYLENOL) tablet 650 mg  650 mg Oral Q6H PRN Curlene Labrum Readling, MD      . amLODipine (NORVASC) tablet 10 mg  10 mg Oral Daily Curlene Labrum Readling, MD   10 mg at 09/10/11 0702  . diazepam (VALIUM) tablet 10 mg  10 mg Oral NOW Curlene Labrum Readling, MD   10 mg at 09/10/11 0703  . magnesium hydroxide (MILK OF MAGNESIA) suspension 30 mL  30 mL Oral Daily PRN Curlene Labrum Readling, MD      . traZODone (DESYREL) tablet 100 mg  100 mg Oral QHS PRN Ronny Bacon, MD       Facility-Administered Medications Ordered in Other Encounters  Medication Dose Route Frequency Provider Last Rate Last Dose  . DISCONTD: amLODipine (NORVASC) tablet 10 mg  10 mg Oral Daily Tatyana A Kirichenko, PA   10 mg at 09/09/11 1506  . DISCONTD: ibuprofen (ADVIL,MOTRIN) tablet 600 mg  600 mg Oral Q8H PRN Tatyana A Kirichenko, PA      . DISCONTD: lithium carbonate capsule 600 mg  600 mg Oral BID Tatyana A Kirichenko, PA   600 mg at 09/09/11 2143  . DISCONTD: LORazepam (ATIVAN) tablet 1 mg  1 mg Oral Q8H PRN Tatyana A Kirichenko, PA      . DISCONTD: ondansetron (ZOFRAN) tablet 4 mg  4 mg Oral Q8H PRN Tatyana A Kirichenko, PA      .  DISCONTD: pantoprazole (PROTONIX) EC tablet 40 mg  40 mg Oral Daily Tatyana A Kirichenko, PA   40 mg at 09/09/11 1421  . DISCONTD: traMADol (ULTRAM) tablet 50 mg  50 mg Oral Q6H PRN Tatyana A Kirichenko, PA      . DISCONTD: zolpidem (AMBIEN) tablet 5 mg  5 mg Oral QHS PRN Lottie Mussel, PA        Lab Results:  Results for orders placed during the hospital encounter of 09/09/11 (from the past 48 hour(s))  URINE RAPID DRUG SCREEN (HOSP PERFORMED)     Status: Abnormal   Collection Time   09/09/11 12:49 PM      Component Value Range Comment   Opiates NONE DETECTED  NONE DETECTED    Cocaine NONE DETECTED  NONE DETECTED    Benzodiazepines POSITIVE (*) NONE DETECTED    Amphetamines NONE DETECTED   NONE DETECTED    Tetrahydrocannabinol NONE DETECTED  NONE DETECTED    Barbiturates NONE DETECTED  NONE DETECTED   CBC     Status: Abnormal   Collection Time   09/09/11  1:28 PM      Component Value Range Comment   WBC 7.0  4.0 - 10.5 K/uL    RBC 3.81 (*) 3.87 - 5.11 MIL/uL    Hemoglobin 10.3 (*) 12.0 - 15.0 g/dL    HCT 45.4 (*) 09.8 - 46.0 %    MCV 86.9  78.0 - 100.0 fL    MCH 27.0  26.0 - 34.0 pg    MCHC 31.1  30.0 - 36.0 g/dL    RDW 11.9  14.7 - 82.9 %    Platelets 330  150 - 400 K/uL   COMPREHENSIVE METABOLIC PANEL     Status: Abnormal   Collection Time   09/09/11  1:28 PM      Component Value Range Comment   Sodium 142  135 - 145 mEq/L    Potassium 3.5  3.5 - 5.1 mEq/L    Chloride 108  96 - 112 mEq/L    CO2 26  19 - 32 mEq/L    Glucose, Bld 92  70 - 99 mg/dL    BUN 16  6 - 23 mg/dL    Creatinine, Ser 5.62  0.50 - 1.10 mg/dL    Calcium 9.5  8.4 - 13.0 mg/dL    Total Protein 7.7  6.0 - 8.3 g/dL    Albumin 3.5  3.5 - 5.2 g/dL    AST 14  0 - 37 U/L    ALT 10  0 - 35 U/L    Alkaline Phosphatase 109  39 - 117 U/L    Total Bilirubin 0.2 (*) 0.3 - 1.2 mg/dL    GFR calc non Af Amer 79 (*) >90 mL/min    GFR calc Af Amer >90  >90 mL/min   ETHANOL     Status: Normal   Collection Time   09/09/11  1:28 PM      Component Value Range Comment   Alcohol, Ethyl (B) <11  0 - 11 mg/dL   ACETAMINOPHEN LEVEL     Status: Normal   Collection Time   09/09/11  1:28 PM      Component Value Range Comment   Acetaminophen (Tylenol), Serum <15.0  10 - 30 ug/mL   LITHIUM LEVEL     Status: Abnormal   Collection Time   09/09/11  2:40 PM      Component Value Range Comment   Lithium Lvl 0.37 (*)  0.80 - 1.40 mEq/L     Physical Findings: AIMS: Facial and Oral Movements Muscles of Facial Expression: None, normal Lips and Perioral Area: None, normal Jaw: None, normal Tongue: None, normal,Extremity Movements Upper (arms, wrists, hands, fingers): None, normal Lower (legs, knees, ankles, toes): None,  normal, Trunk Movements Neck, shoulders, hips: None, normal, Overall Severity Severity of abnormal movements (highest score from questions above): None, normal Incapacitation due to abnormal movements: None, normal Patient's awareness of abnormal movements (rate only patient's report): No Awareness, Dental Status Current problems with teeth and/or dentures?: No Does patient usually wear dentures?: No  CIWA:    COWS:     Treatment Plan Summary: Daily contact with patient to assess and evaluate symptoms and progress in treatment Medication management Mood/anxiety less than 3/10 where the scale is 1 is the best and 10 is the worst  Plan: Admit, start pain management regimen for her arthritis and consider consult with Rheumatology for consideration of SLE treatments in that her cognitive changes, paranoia, and memory started after age 75 and before her arthritidis were diagnosed.  Restart Lithium, Saphris, and Cymbalta for mood and psychosis control.  Start Neurontin for management of her anxiety and pain.  Minipress for her nightmares.  Discussed the risks, benefits, and probable clinical course with and without treatment.  Pt is agreeable to the current course of treatment.  Vishal Sandlin 09/10/2011, 10:09 AM
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
## 2011-09-10 NOTE — Progress Notes (Signed)
Pt went to group at 1515, pt discuss healthy ways to cope with Depression, Anger, Anxiety.

## 2011-09-11 LAB — URINE MICROSCOPIC-ADD ON

## 2011-09-11 LAB — URINALYSIS, ROUTINE W REFLEX MICROSCOPIC
Glucose, UA: NEGATIVE mg/dL
Protein, ur: NEGATIVE mg/dL
pH: 7 (ref 5.0–8.0)

## 2011-09-11 LAB — T3, FREE: T3, Free: 2.3 pg/mL (ref 2.3–4.2)

## 2011-09-11 LAB — T4, FREE: Free T4: 0.86 ng/dL (ref 0.80–1.80)

## 2011-09-11 NOTE — Progress Notes (Signed)
Psychoeducational Group Note  Date:  09/11/2011 Time:  1015  Group Topic/Focus:  Making Healthy Choices:   The focus of this group is to help patients identify negative/unhealthy choices they were using prior to admission and identify positive/healthier coping strategies to replace them upon discharge.  Participation Level:  Minimal  Participation Quality:  Drowsy  Affect:  Depressed  Cognitive:  Appropriate  Insight:  Limited  Engagement in Group:  Limited  Additional Comments:  Pt fell asleep several times during group due to feeling drowsy but she was apologetic and otherwise appropriate.Barbette Merino, Laisha Rau Shari Prows 09/11/2011, 12:25 PM

## 2011-09-11 NOTE — Progress Notes (Signed)
Patient ID: Caitlyn Bautista, female   DOB: [birth year], 61 y.o.   MRN: [id number]  Problem: Bipolar Disorder, PTSD  D: Pt withdrawn and with minimal interaction. Pt out in milieu with no inappropriate behaviors noted. Pt with good appetite and hygiene.  A: Monitor q 15 minutes, encourage staff/peer interaction and group participation. Administer medications as ordered by MD.  R: Pt with dull, flat affect endorsing depression but denies SI or plans to harm herself. Pt attended group session and interacted when prompted but was drowsy at times. Pt compliant with HS medications.

## 2011-09-11 NOTE — Progress Notes (Signed)
Patient ID: Caitlyn Bautista, female   DOB: 02/17/1951, 61 y.o.   MRN: [id number] D: Pt is awake and active on the unit this AM. Pt denies SI/HI and A/V hallucinations. Pt is participating in the milieu and is cooperative with staff. Pt rates their depression at 6 and hopelessness at 4. Pt reports that she slept fair but woke up twice during the night and felt drowsy and foggy this morning. Pt has c/o feeling sleepy and drowsy today.   A: Writer offered self, utilized therapeutic communication and administered medication per MD orders. Writer also encouraged pt to discuss feelings with staff and attend groups. Pt writes that she wishes to take part in all activities so that she can get well. She further writes that she must remember that she is of value and not to worry. Pt is also concerned about being able afford her medications after discharge.   R: Pt is attending groups and tolerating medications well. Writer encouraged pt to discuss problems with obtaining her medications with case manager. Writer will continue to monitor. 15 minute checks are ongoing for safety.

## 2011-09-11 NOTE — Progress Notes (Signed)
[facility] Group Notes:  (Counselor/Nursing/MHT/Case Management/Adjunct)  09/11/2011 10:24 AM  Type of Therapy:  After Care Planning Group  Pt. participated in  After care planning group and was given [facility] pamphlet along with crisis hotline numbers. Pt. Agreed to use them if needed. Pt. Was also given a list of free support groups locate din , information about the [facility], and the crisis hot line number for [facility]. Pt. Spoke about feeling sleepy and states she is still having Si on and off at times. Pt. Contracted for safety and greed to let [facility] staff know is she feels unsafe or going to harm herself. The pt. Stated she saw Dr. Dan Humphreys yesterday and had medication changes. Pt. Was positive about Dr. Dan Humphreys and stated he talked with her and that he appears to care and wants to see if the pt. May be having other diagnosis.  Caitlyn Bautista 09/11/2011, 10:24 AM

## 2011-09-11 NOTE — Progress Notes (Signed)
[facility] Group Notes:  (Counselor/Nursing/MHT/Case Management/Adjunct)  09/11/2011 3:59 PM  Summary of Progress/Problems:  Type of Therapy:  Group Therapy  Participation Level:  Active  Participation Quality:  Appropriate and Attentive  Affect:  Appropriate, drowsy  Cognitive:  Appropriate  Insight:  good  Engagement in Group:  Good  Engagement in Therapy:  Good  Modes of Intervention:  Clarification, Education, Socialization and Support  Summary of Progress/Problems: Pt. participated in group discussion on unhealthy and healthy supports and how to support themselves when their supports are not around. Pt spoke about supports and what it means to them when they hear the word  " support".  Pt. Spoke about support meaning to her someone being there for you and someone who shows compassion about what you are going through.   Neila Gear 09/11/2011, 3:59 PM

## 2011-09-11 NOTE — Progress Notes (Signed)
[facility] Group Notes:  (Counselor/Nursing/MHT/Case Management/Adjunct)  09/11/2011 1:01 AM  Type of Therapy:  Psychoeducational Skills  Participation Level:  Active  Participation Quality:  Appropriate, Attentive, Sharing and Supportive  Affect:  Appropriate  Cognitive:  Alert and Oriented  Insight:  Good  Engagement in Group:  Good  Engagement in Therapy:  Good  Modes of Intervention:  Clarification, Education and Support  Summary of Progress/Problems:   Caitlyn Bautista 09/11/2011, 1:01 AM

## 2011-09-11 NOTE — Therapy (Signed)
Psychoeducational Group Note  Date:  09/11/2011 Time:  2005  Group Topic/Focus:  Wrap-Up Group:   The focus of this group is to help patients review their daily goal of treatment and discuss progress on daily workbooks.  Participation Level:  Active  Participation Quality:  Attentive and Drowsy  Affect:  Appropriate  Cognitive:  Appropriate  Insight:  Good  Engagement in Group:  Good  Additional Comments:  Patient attended and participated in group this evening.  Shareta Fishbaugh, Newton Pigg 09/11/2011, 9:07 PM

## 2011-09-11 NOTE — Progress Notes (Signed)
[facility] MD Progress Note  09/11/2011 4:03 PM  S: "I feel better today. I am trying to find my clothes to put on. I will feel a lot better when I have my regular street clothes on. I have to say that going to group and being around other people helps a lot. I only struggle when I am all alone. No suicidal thoughts".  Diagnosis:   Axis I: Post Traumatic Stress Disorder and Bipolar disorder, now depressed Axis II: Deferred Axis III:  Past Medical History  Diagnosis Date  . PTSD (post-traumatic stress disorder)   . Anxiety   . Fibromyalgia   . Bipolar disorder   . Arthritis   . Hypertension   . Thyroid disease   . Carpal tunnel syndrome, bilateral    Axis IV: other psychosocial or environmental problems Axis V: 61-70 mild symptoms  ADL's:  Intact  Sleep: Good  Appetite:  Good  Suicidal Ideation:  Plan:  No Intent:  No Means:  No Homicidal Ideation:  Plan:  No Intent:  No Means:  no  AEB (as evidenced by): per patient's report  Mental Status Examination/Evaluation: Objective:  Appearance: Casual  Eye Contact::  Good  Speech:  Clear and Coherent  Volume:  Normal  Mood:  "Not too bad, just feeling a lot better"  Affect:  Appropriate  Thought Process:  Coherent  Orientation:  Full  Thought Content:  Rumination  Suicidal Thoughts:  No  Homicidal Thoughts:  No  Memory:  Immediate;   Good Recent;   Good Remote;   Good  Judgement:  Fair  Insight:  Good  Psychomotor Activity:  Normal  Concentration:  Good  Recall:  Good  Akathisia:  No  Handed:  Right  AIMS (if indicated):     Assets:  Desire for Improvement  Sleep:  Number of Hours: 6.25    Vital Signs:Blood pressure 146/87, pulse 75, temperature 97.4 F (36.3 C), temperature source Oral, resp. rate 20, height 5\' 7"  (1.702 m), weight 119.296 kg (263 lb). Current Medications: Current Facility-Administered Medications  Medication Dose Route Frequency Provider Last Rate Last Dose  . acetaminophen (TYLENOL) tablet  650 mg  650 mg Oral Q6H PRN Curlene Labrum Readling, MD   650 mg at 09/11/11 0811  . albuterol (PROVENTIL HFA;VENTOLIN HFA) 108 (90 BASE) MCG/ACT inhaler 2 puff  2 puff Inhalation Daily Sanjuana Kava, NP   2 puff at 09/11/11 0811  . amLODipine (NORVASC) tablet 10 mg  10 mg Oral Daily Curlene Labrum Readling, MD   10 mg at 09/11/11 0812  . asenapine (SAPHRIS) sublingual tablet 5 mg  5 mg Sublingual PRN Mike Craze, MD      . darifenacin (ENABLEX) 24 hr tablet 7.5 mg  7.5 mg Oral Daily Sanjuana Kava, NP   7.5 mg at 09/11/11 4540  . DULoxetine (CYMBALTA) DR capsule 20 mg  20 mg Oral BID Mike Craze, MD   20 mg at 09/11/11 0813  . ferrous sulfate tablet 325 mg  325 mg Oral BID WC Sanjuana Kava, NP   325 mg at 09/11/11 0813  . fluticasone (FLONASE) 50 MCG/ACT nasal spray 2 spray  2 spray Each Nare Daily Sanjuana Kava, NP   2 spray at 09/10/11 1201  . gabapentin (NEURONTIN) capsule 200 mg  200 mg Oral QID Mike Craze, MD   200 mg at 09/11/11 1122  . lithium carbonate capsule 600 mg  600 mg Oral BID Mike Craze, MD  600 mg at 09/11/11 0813  . loratadine (CLARITIN) tablet 10 mg  10 mg Oral Daily Sanjuana Kava, NP   10 mg at 09/11/11 9604  . magnesium hydroxide (MILK OF MAGNESIA) suspension 30 mL  30 mL Oral Daily PRN Curlene Labrum Readling, MD      . meloxicam (MOBIC) tablet 7.5 mg  7.5 mg Oral BID Mike Craze, MD   7.5 mg at 09/11/11 0813  . OLANZapine (ZYPREXA) tablet 10 mg  10 mg Oral QHS Sanjuana Kava, NP   10 mg at 09/10/11 2155  . pantoprazole (PROTONIX) EC tablet 40 mg  40 mg Oral Daily Sanjuana Kava, NP   40 mg at 09/11/11 5409  . prazosin (MINIPRESS) capsule 1 mg  1 mg Oral QHS,MR X 1 Mike Craze, MD   1 mg at 09/10/11 2155  . traZODone (DESYREL) tablet 100 mg  100 mg Oral QHS PRN Ronny Bacon, MD   100 mg at 09/10/11 2154    Lab Results:  Results for orders placed during the hospital encounter of 09/09/11 (from the past 48 hour(s))  SEDIMENTATION RATE     Status: Abnormal   Collection Time    09/10/11  7:26 PM      Component Value Range Comment   Sed Rate 35 (*) 0 - 22 mm/hr   T3, FREE     Status: Normal   Collection Time   09/10/11  7:26 PM      Component Value Range Comment   T3, Free 2.3  2.3 - 4.2 pg/mL   T4, FREE     Status: Normal   Collection Time   09/10/11  7:26 PM      Component Value Range Comment   Free T4 0.86  0.80 - 1.80 ng/dL   TSH     Status: Normal   Collection Time   09/10/11  7:26 PM      Component Value Range Comment   TSH 1.823  0.350 - 4.500 uIU/mL     Physical Findings: AIMS: Facial and Oral Movements Muscles of Facial Expression: None, normal Lips and Perioral Area: None, normal Jaw: None, normal Tongue: None, normal,Extremity Movements Upper (arms, wrists, hands, fingers): None, normal Lower (legs, knees, ankles, toes): None, normal, Trunk Movements Neck, shoulders, hips: None, normal, Overall Severity Severity of abnormal movements (highest score from questions above): None, normal Incapacitation due to abnormal movements: None, normal Patient's awareness of abnormal movements (rate only patient's report): No Awareness, Dental Status Current problems with teeth and/or dentures?: No Does patient usually wear dentures?: No  CIWA:    COWS:     Treatment Plan Summary: Daily contact with patient to assess and evaluate symptoms and progress in treatment Medication management  Plan: Continue current treatment plan.  Armandina Stammer I 09/11/2011, 4:03 PM

## 2011-09-12 DIAGNOSIS — F312 Bipolar disorder, current episode manic severe with psychotic features: Secondary | ICD-10-CM | POA: Diagnosis present

## 2011-09-12 DIAGNOSIS — F315 Bipolar disorder, current episode depressed, severe, with psychotic features: Principal | ICD-10-CM

## 2011-09-12 MED ORDER — GABAPENTIN 300 MG PO CAPS
300.0000 mg | ORAL_CAPSULE | Freq: Four times a day (QID) | ORAL | Status: DC
  Administered 2011-09-12 – 2011-09-17 (×20): 300 mg via ORAL
  Filled 2011-09-12 (×3): qty 1
  Filled 2011-09-12 (×2): qty 12
  Filled 2011-09-12: qty 1
  Filled 2011-09-12: qty 12
  Filled 2011-09-12 (×16): qty 1
  Filled 2011-09-12: qty 12
  Filled 2011-09-12 (×4): qty 1

## 2011-09-12 MED ORDER — PANTOPRAZOLE SODIUM 40 MG PO TBEC
40.0000 mg | DELAYED_RELEASE_TABLET | Freq: Every day | ORAL | Status: DC
  Administered 2011-09-12 – 2011-09-16 (×4): 40 mg via ORAL
  Filled 2011-09-12 (×7): qty 1

## 2011-09-12 MED ORDER — VITAMIN D3 25 MCG (1000 UNIT) PO TABS
1000.0000 [IU] | ORAL_TABLET | Freq: Every day | ORAL | Status: DC
  Administered 2011-09-13 – 2011-09-17 (×5): 1000 [IU] via ORAL
  Filled 2011-09-12 (×7): qty 1

## 2011-09-12 MED ORDER — DULOXETINE HCL 60 MG PO CPEP
60.0000 mg | ORAL_CAPSULE | Freq: Two times a day (BID) | ORAL | Status: DC
  Administered 2011-09-12 – 2011-09-17 (×10): 60 mg via ORAL
  Filled 2011-09-12 (×8): qty 1
  Filled 2011-09-12: qty 14
  Filled 2011-09-12: qty 1
  Filled 2011-09-12: qty 14
  Filled 2011-09-12 (×3): qty 1

## 2011-09-12 NOTE — Progress Notes (Signed)
D: Pt mood is depressed and affect is flat. Pt stated that she slept well throughout the night. She says her energy this morning is low. She rates her depression is a 6 and her hopelessness is a 4. Pt states that she wants to try to get someone to help her with her day to day housework so that she can take better care of herself. Pt stated "I am trying to be happy but it feels like I'm dying inside", "I will keep trying". Pt expressed concerns about obtaining meds after discharge and medication cost.   A: Support given. Verbalization encouraged. Pt encouraged to come up with positive coping skills.   R: Pt is receptive. No complaints of pain or discomfort at this time. Will continue to monitor pt.

## 2011-09-12 NOTE — Tx Team (Signed)
Interdisciplinary Treatment Plan Update (Adult)  Date:  09/12/2011  Time Reviewed:  10:51 AM   Progress in Treatment: Attending groups: Yes Participating in groups:  Yes Taking medication as prescribed: Yes Tolerating medication:  Yes Family/Significant other contact made:  Counselor assessing for appropriate contact Patient understands diagnosis:  Yes Discussing patient identified problems/goals with staff:  Yes Medical problems stabilized or resolved:  Yes Denies suicidal/homicidal ideation: Yes Issues/concerns per patient self-inventory:  None identified Other: N/A  New problem(s) identified: None Identified  Reason for Continuation of Hospitalization: Anxiety Depression Medication stabilization Suicidal ideation  Interventions implemented related to continuation of hospitalization: mood stabilization, medication monitoring and adjustment, group therapy and psycho education, safety checks q 15 mins  Additional comments: N/A  Estimated length of stay: 3-5 days  Discharge Plan: SW will assess for appropriate referrals.    New goal(s): N/A  Review of initial/current patient goals per problem list:    1.  Goal(s): Reduce depressive symptoms  Met:  No  Target date: by discharge  As evidenced by: Reducing depression from a 10 to a 3 as reported by pt.   2.  Goal (s): Reduce/Eliminate suicidal ideation  Met:  No  Target date: by discharge  As evidenced by: pt reporting no SI.    3.  Goal(s): Reduce anxiety symptoms  Met:  No  Target date: by discharge  As evidenced by: Reduce anxiety from a 10 to a 3 as reported by pt.    Attendees: Patient:  Caitlyn Bautista 09/12/2011 10:53 AM   Family:     Physician:  Franchot Gallo, MD  09/12/2011  10:51 AM   Nursing:   Neill Loft, RN 09/12/2011 10:53 AM   Case Manager:  Reyes Ivan, LCSWA 09/12/2011  10:51 AM   Counselor:  Angus Palms, LCSW 09/12/2011  10:51 AM   Other:  Clarice Pole, LCASA 09/12/2011  10:51 AM     Other:  Barrie Folk, RN 09/12/2011 10:54 AM   Other:     Other:      Scribe for Treatment Team:   Carmina Miller, 09/12/2011 , 10:51 AM

## 2011-09-12 NOTE — [facility] Counselor (Signed)
Group Therapy Note  Date:  09/12/2011 Time:  1:15  Group Topic/Focus:  Dimensions of Wellness:   The focus of this group is to introduce the topic of wellness and discuss the role each dimension of wellness plays in total health.  Participation Level:  Active  Participation Quality:  Appropriate, Attentive, Sharing and Supportive  Affect:  Appropriate and Depressed  Cognitive:  Appropriate  Insight:  Good  Engagement in Group:  Good  Additional Comments:  Verlene was engaged in group and spoke a lot about her spiritual wellness. She made multiple references to the Bible and spoke about how it has helped her in difficult times.   HUQ, NADIA 09/12/2011, 3:58 PM  Cosigned by: Angus Palms, LCSW

## 2011-09-12 NOTE — Therapy (Signed)
Psychoeducational Group Note  Date:  09/12/2011 Time: 2010  Group Topic/Focus:  Wrap-Up Group:   The focus of this group is to help patients review their daily goal of treatment and discuss progress on daily workbooks.  Participation Level:  Active  Participation Quality:  Attentive  Affect:  Appropriate  Cognitive:  Appropriate  Insight:  Good  Engagement in Group:  Good  Additional Comments:  Patient participated in group this evening.  Naida Escalante, Newton Pigg 09/12/2011, 9:36 PM

## 2011-09-12 NOTE — Progress Notes (Signed)
[facility] MD Progress Note  09/12/2011 2:54 PM  Diagnosis:  Axis I: Bipolar I Disorder - Most Recent Episode - Depressed with Psychotic Features.   The patient was seen today and reports the following:   ADL's: Intact.  Sleep: The patient reports to having some difficulty initiating and maintaining sleep prior to admission but slept better last night.  Appetite: The patient reports that her appetite is decreased by improving.   Mild>(1-10) >Severe  Hopelessness (1-10): 4  Depression (1-10): 7-8  Anxiety (1-10): 6  Pain (1-10) 9  Suicidal Ideation: The patient reports suicidal ideations today which are "on and off." Plan: No  Intent: No  Means: No   Homicidal Ideation: The patient denies any homicidal ideations today.  Plan: No  Intent: No.  Means: No   General Appearance/Behavior: The patient was friendly and cooperative today with this provider but appears significantly depressed.  Eye Contact: Good.  Speech: Appropriate in rate and volume today with no pressuring noted.  Motor Behavior: wnl.  Level of Consciousness: Alert and Oriented x 3.  Mental Status: Alert and Oriented x 3.  Mood: Appears severely depressed today.  Affect: Appears essentially flat.  Anxiety Level: Moderate anxiety reported today.  Thought Process: wnl.  Thought Content: The patient denies any auditory or visual hallucinations today.  She also denies any current delusional thinking.  The patient does state that she was experiencing auditory hallucinations prior to admission. Perception: wnl Judgment: Good.  Insight: Good.  Cognition: Oriented to person, place and time.  Sleep:  Number of Hours: 6.75    Vital Signs:Blood pressure 105/68, pulse 77, temperature 97.7 F (36.5 C), temperature source Oral, resp. rate 24, height 5\' 7"  (1.702 m), weight 119.296 kg (263 lb).  Current Medications: Current Facility-Administered Medications  Medication Dose Route Frequency Provider Last Rate Last Dose  .  acetaminophen (TYLENOL) tablet 650 mg  650 mg Oral Q6H PRN Curlene Labrum Emeric Novinger, MD   650 mg at 09/12/11 1314  . albuterol (PROVENTIL HFA;VENTOLIN HFA) 108 (90 BASE) MCG/ACT inhaler 2 puff  2 puff Inhalation Daily Ronny Bacon, MD   2 puff at 09/12/11 0832  . amLODipine (NORVASC) tablet 10 mg  10 mg Oral Daily Curlene Labrum Svetlana Bagby, MD   10 mg at 09/12/11 0830  . cholecalciferol (VITAMIN D) tablet 1,000 Units  1,000 Units Oral Q breakfast Curlene Labrum Karalynn Cottone, MD      . darifenacin (ENABLEX) 24 hr tablet 7.5 mg  7.5 mg Oral Daily Curlene Labrum Jayana Kotula, MD   7.5 mg at 09/12/11 0830  . DULoxetine (CYMBALTA) DR capsule 60 mg  60 mg Oral BID Curlene Labrum Lovett Coffin, MD      . ferrous sulfate tablet 325 mg  325 mg Oral BID WC Curlene Labrum Shene Maxfield, MD   325 mg at 09/12/11 0830  . fluticasone (FLONASE) 50 MCG/ACT nasal spray 2 spray  2 spray Each Nare Daily Ronny Bacon, MD   2 spray at 09/12/11 0831  . gabapentin (NEURONTIN) capsule 300 mg  300 mg Oral QID Curlene Labrum Davi Rotan, MD      . lithium carbonate capsule 600 mg  600 mg Oral BID Curlene Labrum Tyronica Truxillo, MD   600 mg at 09/12/11 0830  . loratadine (CLARITIN) tablet 10 mg  10 mg Oral Daily Curlene Labrum Ercelle Winkles, MD   10 mg at 09/12/11 0830  . magnesium hydroxide (MILK OF MAGNESIA) suspension 30 mL  30 mL Oral Daily PRN Ronny Bacon, MD      .  meloxicam (MOBIC) tablet 7.5 mg  7.5 mg Oral BID Curlene Labrum Laney Louderback, MD   7.5 mg at 09/12/11 0831  . OLANZapine (ZYPREXA) tablet 10 mg  10 mg Oral QHS Curlene Labrum Kailen Hinkle, MD   10 mg at 09/11/11 2106  . pantoprazole (PROTONIX) EC tablet 40 mg  40 mg Oral Q2200 Curlene Labrum Cardin Nitschke, MD      . traZODone (DESYREL) tablet 100 mg  100 mg Oral QHS PRN Curlene Labrum Friedrich Harriott, MD   100 mg at 09/10/11 2154  . DISCONTD: asenapine (SAPHRIS) sublingual tablet 5 mg  5 mg Sublingual PRN Mike Craze, MD      . DISCONTD: DULoxetine (CYMBALTA) DR capsule 20 mg  20 mg Oral BID Mike Craze, MD   20 mg at 09/12/11 0830  . DISCONTD: gabapentin (NEURONTIN) capsule 200 mg   200 mg Oral QID Mike Craze, MD   200 mg at 09/12/11 1311  . DISCONTD: pantoprazole (PROTONIX) EC tablet 40 mg  40 mg Oral Daily Sanjuana Kava, NP   40 mg at 09/12/11 0830  . DISCONTD: prazosin (MINIPRESS) capsule 1 mg  1 mg Oral QHS,MR X 1 Mike Craze, MD   1 mg at 09/11/11 2106   Lab Results:  Results for orders placed during the hospital encounter of 09/09/11 (from the past 48 hour(s))  SEDIMENTATION RATE     Status: Abnormal   Collection Time   09/10/11  7:26 PM      Component Value Range Comment   Sed Rate 35 (*) 0 - 22 mm/hr   T3, FREE     Status: Normal   Collection Time   09/10/11  7:26 PM      Component Value Range Comment   T3, Free 2.3  2.3 - 4.2 pg/mL   T4, FREE     Status: Normal   Collection Time   09/10/11  7:26 PM      Component Value Range Comment   Free T4 0.86  0.80 - 1.80 ng/dL   TSH     Status: Normal   Collection Time   09/10/11  7:26 PM      Component Value Range Comment   TSH 1.823  0.350 - 4.500 uIU/mL   VITAMIN D 25 HYDROXY     Status: Abnormal   Collection Time   09/10/11  7:26 PM      Component Value Range Comment   Vit D, 25-Hydroxy 11 (*) 30 - 89 ng/mL   URINALYSIS, ROUTINE W REFLEX MICROSCOPIC     Status: Abnormal   Collection Time   09/11/11  8:20 PM      Component Value Range Comment   Color, Urine YELLOW  YELLOW    APPearance CLEAR  CLEAR    Specific Gravity, Urine 1.012  1.005 - 1.030    pH 7.0  5.0 - 8.0    Glucose, UA NEGATIVE  NEGATIVE mg/dL    Hgb urine dipstick TRACE (*) NEGATIVE    Bilirubin Urine NEGATIVE  NEGATIVE    Ketones, ur NEGATIVE  NEGATIVE mg/dL    Protein, ur NEGATIVE  NEGATIVE mg/dL    Urobilinogen, UA 0.2  0.0 - 1.0 mg/dL    Nitrite NEGATIVE  NEGATIVE    Leukocytes, UA SMALL (*) NEGATIVE   URINE MICROSCOPIC-ADD ON     Status: Abnormal   Collection Time   09/11/11  8:20 PM      Component Value Range Comment   Squamous Epithelial / LPF FEW (*)  RARE    WBC, UA 3-6  <3 WBC/hpf    RBC / HPF 0-2  <3 RBC/hpf    Urine-Other  MUCOUS PRESENT      Physical Findings: AIMS: Facial and Oral Movements Muscles of Facial Expression: None, normal Lips and Perioral Area: None, normal Jaw: None, normal Tongue: None, normal,Extremity Movements Upper (arms, wrists, hands, fingers): None, normal Lower (legs, knees, ankles, toes): None, normal, Trunk Movements Neck, shoulders, hips: None, normal, Overall Severity Severity of abnormal movements (highest score from questions above): None, normal Incapacitation due to abnormal movements: None, normal Patient's awareness of abnormal movements (rate only patient's report): No Awareness, Dental Status Current problems with teeth and/or dentures?: No Does patient usually wear dentures?: No   Review of Systems:  Neurological: The patient denies any headaches today. She denies any seizures or dizziness.  G.I.: The patient denies any constipation or G.I. Upset today.  Musculoskeletal: The patient reports chronic pain related to fibromyalgia which is under poor control today.   Time was spent today discussing with the patient her current symptoms. The patient states that she did have some difficulty initiating and maintaining sleep prior to admission but states she is now sleeping better on her current regiment.  She reports a decreased appetite but states this is improving.  She reports severe feelings of sadness, anhedonia and depressed mood and reports moderate anxiety symptoms today. She reports suicidal ideations "on and off" today but denies any homicidal ideations.  She denies any current auditory or visual hallucinations today but reports to hearing voices prior to admission.  She denies any delusional thinking.    The patient states that her primary concern today is her chronic pain.   Treatment Plan Summary:  1. Daily contact with patient to assess and evaluate symptoms and progress in treatment.  2. Medication management  3. The patient will deny suicidal ideations or  homicidal ideations for 48 hours prior to discharge and have a depression and anxiety rating of 3 or less. The patient will also deny any auditory or visual hallucinations or delusional thinking.  4. The patient will deny any symptoms of substance withdrawal at time of discharge.   Plan:  1. Will increase the medication Cymbalta to 60 mgs po q am for depression and chronic pain..  2. Will continue the medication Lithium Carbonate at 600 mgs po BID for mood stabilization. 3. Will continue the medication Zyprexa at 10 mgs po qhs - sleep, mood stabilization and psychosis. 4. Will increase the medication Neurontin at 300 mgs po qid for pain and anxiety. 5. Will start the medication Vitamin D at 1000 Units po q am for low Vitamin D. 6. Will continue the patient on her non-psychiatric medications.  7. Laboratory studies reviewed.  8. Will continue to monitor.   Ademola Vert 09/12/2011, 2:54 PM

## 2011-09-12 NOTE — Progress Notes (Signed)
Pt attended discharge planning group and actively participated in group.  SW provided pt with today's workbook.  Pt presents with flat affect and depressed mood.  Pt rates depression at a 7.5 and anxiety at a 6 today.  Pt endorses SI but contracts for safety on the unit.  Pt was open with sharing reason for entering the hospital.  Pt states that she has been depressed and suicidal for the past month.  Pt states that she had a plan to overdose and was sitting there about to do it when her great granddaughter called her.  Pt states that she was doing well before this past month but got into a deep depression.  Pt states that she lives in [place] alone and is on disability.  Pt states that she was seeing Geanie Berlin at [facility] outpt for therapy and Dr. Evelene Croon for medication management but stopped going due being unable to afford the copays.  SW will make appropriate referrals.  No further needs voiced by pt at this time.  Safety planning and suicide prevention discussed.  Pt participated in discussion and acknowledged an understanding of the information provided.       Reyes Ivan, LCSWA 09/12/2011  12:10 PM

## 2011-09-13 NOTE — Progress Notes (Signed)
Psychoeducational Group Note  Date:  09/13/2011 Time:1100     Group Topic/Focus:  Recovery Goals:   The focus of this group is to identify appropriate goals for recovery and establish a plan to achieve them.  Participation Level:  Active  Participation Quality:  Appropriate  Affect:  Appropriate  Cognitive:  Appropriate  Insight:  Good  Engagement in Group:  Good  Additional Comment: Pt was interactive in group as she provided helpful information to other pts  Lenox Ahr 09/13/2011, 1:27 PM

## 2011-09-13 NOTE — Progress Notes (Signed)
Pt attended discharge planning group and actively participated in group.  SW provided pt with today's workbook.  Pt presents with anxious mood and affect.  Pt rates depression at a 6 and anxiety at a 5 today.  Pt denies SI now but states she did have thoughts early this morning.  Pt contracts for safety on the unit.  Pt reports feeling better today, stating her depression and anxiety are down.  SW discussed after care plans with pt.  Pt was seeing Dr. Evelene Croon and Orvan July for medication management and therapy.  Pt states that she is not able to afford all of the copays for that and her medical appointments.  SW discussed an alternative option would be to still see Dr. Evelene Croon and do group therapy and support groups.  Pt agreed to this plan.  SW scheduled pt's follow up with Dr. Evelene Croon and provided pt with a list of support groups and info on [employer].  No further needs voiced by pt at this time.   Reyes Ivan, LCSWA 09/13/2011  10:27 AM
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
## 2011-09-13 NOTE — Progress Notes (Signed)
D:  Patient up and visible in the milieu today.  Attending groups and interacting well with staff and peers.  Has been pleasant and cooperative.  Tolerating medications well.  Remains depressed, but optimistic.  Attending to hygiene and room cleanliness.   A:  Encouraged participation in groups, offered emotional support.   R:  Patient receptive and has been an active participant on the unit.

## 2011-09-13 NOTE — Progress Notes (Signed)
D: Pt has passive SI but contracts for safety. Pt is pleasant and cooperative. Pt remains depressed but feels being here is helping.   A: Pt was offered support and encouragement. Pt was given scheduled medications. Pt was encourage to attend groups. Q 15 minute checks were done for safety.  R:Pt attends groups and interacts well with peers and staff. Pt is taking medication. Pt has no complaints.Pt receptive to treatment and safety maintained on unit.

## 2011-09-13 NOTE — Progress Notes (Signed)
D: Pt denies SI/HI/AVH. Pt states that her depression has decreased from earlier in the day. States that earlier she was in a bad place, but throughout the day she has felt better. States that she has good moments and bad, however she feels "ok" now. A: Support and encouragement offered to pt. Advised her to let me know if she felt that her depression was increasing so that I could potentially assist with identifying triggers. R: Pt receptive.

## 2011-09-13 NOTE — Progress Notes (Signed)
[facility] MD Progress Note  09/13/2011 3:21 PM  Diagnosis:  Axis I: Bipolar I Disorder - Most Recent Episode - Depressed with Psychotic Features.   The patient was seen today and reports the following:   ADL's: Intact.  Sleep: The patient reports to sleeping better last night.  Appetite: The patient reports that her appetite is continuing to improve.   Mild>(1-10) >Severe  Hopelessness (1-10): 0  Depression (1-10): 5-6  Anxiety (1-10): 5-6  Pain (1-10) 4   Suicidal Ideation: The patient denies any suicidal ideations today but states she is not afraid to die and would "be ok with it."  Plan: No  Intent: No  Means: No   Homicidal Ideation: The patient adamantly denies any homicidal ideations today.  Plan: No  Intent: No.  Means: No   General Appearance/Behavior: The patient remained friendly and cooperative today with this provider with less depressive symptoms today.  Eye Contact: Good.  Speech: Appropriate in rate and volume today with no pressuring noted.  Motor Behavior: wnl.  Level of Consciousness: Alert and Oriented x 3.  Mental Status: Alert and Oriented x 3.  Mood: Appears moderately depressed today.  Affect: Appears moderately constricted.  Anxiety Level: Moderate anxiety reported today.  Thought Process: wnl.  Thought Content: The patient denies any auditory or visual hallucinations today. She also denies any current delusional thinking.  Perception: wnl  Judgment: Good.  Insight: Good.  Cognition: Oriented to person, place and time.  Sleep:  Number of Hours: 4.75    Vital Signs:Blood pressure 167/110, pulse 113, temperature 97.9 F (36.6 C), temperature source Oral, resp. rate 17, height 5\' 7"  (1.702 m), weight 119.296 kg (263 lb).  Current Medications: Current Facility-Administered Medications  Medication Dose Route Frequency Provider Last Rate Last Dose  . acetaminophen (TYLENOL) tablet 650 mg  650 mg Oral Q6H PRN Curlene Labrum Genova Kiner, MD   650 mg at 09/12/11 1314    . albuterol (PROVENTIL HFA;VENTOLIN HFA) 108 (90 BASE) MCG/ACT inhaler 2 puff  2 puff Inhalation Daily Curlene Labrum Kendre Jacinto, MD   2 puff at 09/13/11 0816  . amLODipine (NORVASC) tablet 10 mg  10 mg Oral Daily Curlene Labrum Ines Warf, MD   10 mg at 09/13/11 0815  . cholecalciferol (VITAMIN D) tablet 1,000 Units  1,000 Units Oral Q breakfast Ronny Bacon, MD   1,000 Units at 09/13/11 0814  . darifenacin (ENABLEX) 24 hr tablet 7.5 mg  7.5 mg Oral Daily Curlene Labrum Fahd Galea, MD   7.5 mg at 09/13/11 0816  . DULoxetine (CYMBALTA) DR capsule 60 mg  60 mg Oral BID Curlene Labrum Vasilios Ottaway, MD   60 mg at 09/13/11 0819  . ferrous sulfate tablet 325 mg  325 mg Oral BID WC Curlene Labrum Mustaf Antonacci, MD   324 mg at 09/13/11 0815  . fluticasone (FLONASE) 50 MCG/ACT nasal spray 2 spray  2 spray Each Nare Daily Ronny Bacon, MD   2 spray at 09/13/11 0817  . gabapentin (NEURONTIN) capsule 300 mg  300 mg Oral QID Curlene Labrum Namish Krise, MD   300 mg at 09/13/11 1259  . lithium carbonate capsule 600 mg  600 mg Oral BID Curlene Labrum Minetta Krisher, MD   600 mg at 09/13/11 0814  . loratadine (CLARITIN) tablet 10 mg  10 mg Oral Daily Curlene Labrum Lena Gores, MD   10 mg at 09/13/11 0816  . magnesium hydroxide (MILK OF MAGNESIA) suspension 30 mL  30 mL Oral Daily PRN Ronny Bacon, MD      .  meloxicam (MOBIC) tablet 7.5 mg  7.5 mg Oral BID Curlene Labrum Phenix Grein, MD   7.5 mg at 09/13/11 0816  . OLANZapine (ZYPREXA) tablet 10 mg  10 mg Oral QHS Curlene Labrum Socorro Kanitz, MD   10 mg at 09/12/11 2105  . pantoprazole (PROTONIX) EC tablet 40 mg  40 mg Oral Q2200 Curlene Labrum Stephen Turnbaugh, MD   40 mg at 09/12/11 2105  . traZODone (DESYREL) tablet 100 mg  100 mg Oral QHS PRN Ronny Bacon, MD   100 mg at 09/10/11 2154   Lab Results:  Results for orders placed during the hospital encounter of 09/09/11 (from the past 48 hour(s))  URINALYSIS, ROUTINE W REFLEX MICROSCOPIC     Status: Abnormal   Collection Time   09/11/11  8:20 PM      Component Value Range Comment   Color, Urine YELLOW  YELLOW     APPearance CLEAR  CLEAR    Specific Gravity, Urine 1.012  1.005 - 1.030    pH 7.0  5.0 - 8.0    Glucose, UA NEGATIVE  NEGATIVE mg/dL    Hgb urine dipstick TRACE (*) NEGATIVE    Bilirubin Urine NEGATIVE  NEGATIVE    Ketones, ur NEGATIVE  NEGATIVE mg/dL    Protein, ur NEGATIVE  NEGATIVE mg/dL    Urobilinogen, UA 0.2  0.0 - 1.0 mg/dL    Nitrite NEGATIVE  NEGATIVE    Leukocytes, UA SMALL (*) NEGATIVE   URINE MICROSCOPIC-ADD ON     Status: Abnormal   Collection Time   09/11/11  8:20 PM      Component Value Range Comment   Squamous Epithelial / LPF FEW (*) RARE    WBC, UA 3-6  <3 WBC/hpf    RBC / HPF 0-2  <3 RBC/hpf    Urine-Other MUCOUS PRESENT      Physical Findings: AIMS: Facial and Oral Movements Muscles of Facial Expression: None, normal Lips and Perioral Area: None, normal Jaw: None, normal Tongue: None, normal,Extremity Movements Upper (arms, wrists, hands, fingers): None, normal Lower (legs, knees, ankles, toes): None, normal, Trunk Movements Neck, shoulders, hips: None, normal, Overall Severity Severity of abnormal movements (highest score from questions above): None, normal Incapacitation due to abnormal movements: None, normal Patient's awareness of abnormal movements (rate only patient's report): No Awareness, Dental Status Current problems with teeth and/or dentures?: No Does patient usually wear dentures?: No   Review of Systems:  Neurological: The patient denies any headaches today. She denies any seizures or dizziness.  G.I.: The patient denies any constipation or G.I. Upset today.  Musculoskeletal: The patient reports chronic pain related to fibromyalgia which is under poor control today.   Time was spent today discussing with the patient her current symptoms. The patient reports that she is "sleeping better."  The patient reports a decreased appetite but states her appetite is improving.  The patient reports moderate feelings of sadness, anhedonia or depressed  mood and denies any current suicidal or homicidal ideations.  She also reports moderate anxiety today.  The patient denies any auditory or visual hallucinations or delusional thinking today.  She reports that her pain has improved and on a 1-10 today it is a 4.  She reports to tolerating her medications well with no medication side effects.  Treatment Plan Summary:  1. Daily contact with patient to assess and evaluate symptoms and progress in treatment.  2. Medication management  3. The patient will deny suicidal ideations or homicidal ideations for 48 hours prior to  discharge and have a depression and anxiety rating of 3 or less. The patient will also deny any auditory or visual hallucinations or delusional thinking.  4. The patient will deny any symptoms of substance withdrawal at time of discharge.   Plan:  1. Will continue the medication Cymbalta at 60 mgs po q am and pm for depression and chronic pain..  2. Will continue the medication Lithium Carbonate at 600 mgs po BID for mood stabilization.  3. Will continue the medication Zyprexa at 10 mgs po qhs - sleep, mood stabilization and psychosis.  4. Will continue the medication Neurontin at 300 mgs po qid for pain and anxiety.  5. Will continue the medication Vitamin D at 1000 Units po q am for low Vitamin D.  6. Will continue the patient on her non-psychiatric medications.  7. Laboratory studies reviewed.  8. Will continue to monitor.    Zylen Wenig 09/13/2011, 3:21 PM
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
## 2011-09-13 NOTE — Therapy (Signed)
Psychoeducational Group Note  Date:  09/13/2011 Time:  2005  Group Topic/Focus:  Wrap-Up Group:   The focus of this group is to help patients review their daily goal of treatment and discuss progress on daily workbooks.  Participation Level:  Active  Participation Quality:  Monopolizing and Redirectable  Affect:  Appropriate  Cognitive:  Alert  Insight:  Good  Engagement in Group:  Good  Additional Comments:  Patient attended and participated in wrap-up group this evening.  Lashelle Koy, Newton Pigg 09/13/2011, 8:52 PM

## 2011-09-14 DIAGNOSIS — F312 Bipolar disorder, current episode manic severe with psychotic features: Secondary | ICD-10-CM

## 2011-09-14 NOTE — Progress Notes (Signed)
[facility] MD Progress Note  09/14/2011 4:05 PM  "My morning started off very badly. It was because I had a very horrible dreams last night. The dreams were about what I do not want to remember no more. These are the things that I have been trying to forget and or deal with because I could not forget them. But I did manage to get out of bed, dress up and gone to groups. I am good at hiding my depression very well. That was how I lived my life x many years until now. I did better as the day progressed because I realize it was only a dream. My depression today is at 5.5. No thoughts of suicide".   Diagnosis:   Axis I: Bipolar I disorder, most recent episode (or current) manic, severe, specified as with psychotic behavior Axis II: Deferred Axis III:  Past Medical History  Diagnosis Date  . PTSD (post-traumatic stress disorder)   . Anxiety   . Fibromyalgia   . Bipolar disorder   . Arthritis   . Hypertension   . Thyroid disease   . Carpal tunnel syndrome, bilateral    Axis IV: other psychosocial or environmental problems Axis V: 51-60 moderate symptoms  ADL's:  Intact  Sleep: Good  Appetite:  Good  Suicidal Ideation:  Plan:  No Intent:  No Means:  No Homicidal Ideation:  Plan:  No Intent:  No Means:  No  AEB (as evidenced by): Per patient's report.  Mental Status Examination/Evaluation: Objective:  Appearance: Casual  Eye Contact::  Fair  Speech:  Clear and Coherent  Volume:  Normal  Mood:  "My depression is at 5.5 now"  Affect:  Flat  Thought Process:  Coherent and Intact  Orientation:  Full  Thought Content:  Rumination  Suicidal Thoughts:  No  Homicidal Thoughts:  No  Memory:  Immediate;   Good Recent;   Good Remote;   Good  Judgement:  Fair  Insight:  Fair  Psychomotor Activity:  Normal  Concentration:  Good  Recall:  Good  Akathisia:  No  Handed:  Right  AIMS (if indicated):     Assets:  Desire for Improvement  Sleep:  Number of Hours: 6.25    Vital  Signs:Blood pressure 136/87, pulse 77, temperature 97.5 F (36.4 C), temperature source Oral, resp. rate 16, height 5\' 7"  (1.702 m), weight 119.296 kg (263 lb). Current Medications: Current Facility-Administered Medications  Medication Dose Route Frequency Provider Last Rate Last Dose  . acetaminophen (TYLENOL) tablet 650 mg  650 mg Oral Q6H PRN Curlene Labrum Readling, MD   650 mg at 09/12/11 1314  . albuterol (PROVENTIL HFA;VENTOLIN HFA) 108 (90 BASE) MCG/ACT inhaler 2 puff  2 puff Inhalation Daily Curlene Labrum Readling, MD   2 puff at 09/13/11 0816  . amLODipine (NORVASC) tablet 10 mg  10 mg Oral Daily Curlene Labrum Readling, MD   10 mg at 09/14/11 0750  . cholecalciferol (VITAMIN D) tablet 1,000 Units  1,000 Units Oral Q breakfast Ronny Bacon, MD   1,000 Units at 09/14/11 0752  . darifenacin (ENABLEX) 24 hr tablet 7.5 mg  7.5 mg Oral Daily Curlene Labrum Readling, MD   7.5 mg at 09/14/11 0751  . DULoxetine (CYMBALTA) DR capsule 60 mg  60 mg Oral BID Curlene Labrum Readling, MD   60 mg at 09/14/11 0750  . ferrous sulfate tablet 325 mg  325 mg Oral BID WC Curlene Labrum Readling, MD   325 mg at 09/14/11 0750  .  fluticasone (FLONASE) 50 MCG/ACT nasal spray 2 spray  2 spray Each Nare Daily Ronny Bacon, MD   2 spray at 09/14/11 0754  . gabapentin (NEURONTIN) capsule 300 mg  300 mg Oral QID Curlene Labrum Readling, MD   300 mg at 09/14/11 1253  . lithium carbonate capsule 600 mg  600 mg Oral BID Curlene Labrum Readling, MD   600 mg at 09/14/11 0751  . loratadine (CLARITIN) tablet 10 mg  10 mg Oral Daily Curlene Labrum Readling, MD   10 mg at 09/14/11 0751  . magnesium hydroxide (MILK OF MAGNESIA) suspension 30 mL  30 mL Oral Daily PRN Curlene Labrum Readling, MD      . meloxicam (MOBIC) tablet 7.5 mg  7.5 mg Oral BID Curlene Labrum Readling, MD   7.5 mg at 09/14/11 0751  . OLANZapine (ZYPREXA) tablet 10 mg  10 mg Oral QHS Curlene Labrum Readling, MD   10 mg at 09/13/11 2130  . pantoprazole (PROTONIX) EC tablet 40 mg  40 mg Oral Q2200 Curlene Labrum Readling, MD   40 mg at  09/13/11 2130  . traZODone (DESYREL) tablet 100 mg  100 mg Oral QHS PRN Ronny Bacon, MD   100 mg at 09/10/11 2154    Lab Results: No results found for this or any previous visit (from the past 48 hour(s)).  Physical Findings: AIMS: Facial and Oral Movements Muscles of Facial Expression: None, normal Lips and Perioral Area: None, normal Jaw: None, normal Tongue: None, normal,Extremity Movements Upper (arms, wrists, hands, fingers): None, normal Lower (legs, knees, ankles, toes): None, normal, Trunk Movements Neck, shoulders, hips: None, normal, Overall Severity Severity of abnormal movements (highest score from questions above): None, normal Incapacitation due to abnormal movements: None, normal Patient's awareness of abnormal movements (rate only patient's report): No Awareness, Dental Status Current problems with teeth and/or dentures?: No Does patient usually wear dentures?: No  CIWA:    COWS:     Treatment Plan Summary: Daily contact with patient to assess and evaluate symptoms and progress in treatment Medication management  Plan: No changes made on the current treatment plan.  Continue treatment plan.  Armandina Stammer I 09/14/2011, 4:05 PM

## 2011-09-14 NOTE — Progress Notes (Signed)
[facility] Group Notes:  (Counselor/Nursing/MHT/Case Management/Adjunct)  09/14/2011 1:51 PM  Type of Therapy:  Psychoeducational Skills  Participation Level:  Active  Participation Quality:  Appropriate, Attentive, Intrusive, Redirectable, Sharing and Supportive  Affect:  Appropriate  Cognitive:  Alert, Appropriate and Oriented  Insight:  Good  Engagement in Group:  Good  Engagement in Therapy:  n/a  Modes of Intervention:  Activity, Education, Limit-setting, Problem-solving, Socialization and Support  Summary of Progress/Problems:  Caitlyn Bautista attended Psychoeducational group that focused on using quality time with support systems/individuals to engage in healthly coping skills. Caitlyn Bautista participated in activity guessing about self and peers. Caitlyn Bautista was active and insightful while group discussed who their support systems are, how they can spend positive quality time with them as a coping skill and a way to strengthen their relationship. Caitlyn Bautista was given a homework assignment to find two ways to improve her support systems and twenty activities she can do to spend quality time with her supports. Caitlyn Bautista accept positive input from peer about new activities to meet new people.    Wandra Scot 09/14/2011, 1:51 PM

## 2011-09-14 NOTE — Progress Notes (Signed)
[facility] Group Notes: (Counselor/Nursing/MHT/Case Management/Adjunct) 09/14/2011   1:15-2:30pm Emotion Regulation  Type of Therapy:  Group Therapy  Participation Level:  Active  Participation Quality:  Monopolizing, Sharing   Affect:  Appropriate  Cognitive:  Appropriate  Insight: Limited   Engagement in Group: Good  Engagement in Therapy:  Limited  Modes of Intervention:  Support and Exploration  Summary of Progress/Problems: Zanaiya was very talkative, discussing her experience of discrimination as a person with a mental illness. She described how others discount her opinions or experiences due to her Bipolar Disorder. She processed her own experiences with not acting on the urge she experiences along with anger, and explored times when she had trouble not acting. Shamekia interrupted several times and stated that she did not want others to think she was talking the whole group, but continued to interrupt and tell stories. She was doubtful about the breathing exercise the group did, but afterward stated it was very helpful and requested a copy of the cd that was used.  Angus Palms, LCSW 09/14/2011  3:29 PM
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
## 2011-09-14 NOTE — Progress Notes (Signed)
Psychoeducational Group Note  Date:  09/14/2011 Time:  1100  Group Topic/Focus:  Personal Choices and Values:   The focus of this group is to help patients assess and explore the importance of values in their lives, how their values affect their decisions, how they express their values and what opposes their expression.  Participation Level:  Active  Participation Quality:  Appropriate, Sharing and Supportive  Affect:  Appropriate  Cognitive:  Alert and Appropriate  Insight:  Good  Engagement in Group:  Good  Additional Comments:  Pt was very active and appropriate. Pt shared her top five values and also shared why these values were important to her.   Sharyn Lull 09/14/2011, 11:40 AM

## 2011-09-14 NOTE — Progress Notes (Signed)
[facility] Family/Significant Other Suicide Prevention Education  Suicide Prevention Education:  Contact Attempts: Leta Speller, friend, [phone]) has been identified by the patient as the family member/significant other with whom the patient will be residing, and identified as the person(s) who will aid the patient in the event of a mental health crisis.  With written consent from the patient, two attempts were made to provide suicide prevention education, prior to and/or following the patient's discharge.  We were unsuccessful in providing suicide prevention education.  A suicide education pamphlet was given to the patient to share with family/significant other.  Date and time of first attempt: 09/14/2011   9:43am Date and time of second attempt:  Billie Lade 09/14/2011, 10:28 AM

## 2011-09-14 NOTE — Progress Notes (Signed)
09/14/2011         Time: 1500      Group Topic/Focus: The focus of this group is on enhancing the patient's understanding of leisure, barriers to leisure, and the importance of engaging in positive leisure activities upon discharge for improved total health.   Participation Level: Minimal  Participation Quality: Appropriate and Attentive  Affect: Appropriate  Cognitive: Oriented  Additional Comments: Patient missed much of group as she was being seen by NP.   Jaasiel Hollyfield 09/14/2011 3:35 PM

## 2011-09-14 NOTE — Progress Notes (Signed)
Pt attended discharge planning group and actively participated in group.  SW provided pt with today's workbook.  Pt presents with flat affect and depressed mood.  Pt rates depression at a 6 and anxiety at a 5 today.  Pt denies SI, but reports having thoughts of death and how everyone would deal with her death.  Pt states that if she d/c now she knows she would be back in her bed in her room.  Pt will follow up with Dr. Evelene Croon for medication management and support groups and [employer].  No further needs voiced by pt at this time.    Caitlyn Bautista, LCSWA 09/14/2011  10:33 AM

## 2011-09-14 NOTE — Progress Notes (Signed)
D: Appears bright on approach. Calm and cooperative with assessment. No acute distress noted. States she had a terrible morning r/t residual effects of nightmares. However, as the day went on she appreciated a big swing in her mood. When asked what prompted this rapid reversal in her mood, she indicated that the groups today were very information, uplifting and fun. States she and her peers got involved a lot and they all seemed to be doing much better. Rated her Depression a 7 this AM and now a 5. Rated her hopelessness a 5 and she now suffers from no hopelessness. States she had passive SI this AM as well but now denies and contracts for safety. Denies HI/AVH.  A: Support and encouragement provided. Safety has been maintained with Q15 minute observation. POC and medications for the shift reviewed and understanding verbalized. Meds given as ordered.   R: Pt is bright and interactive. Remains safe. Compliant with treatment goals. Will continue to monitor Q14minutes and continue current POC.

## 2011-09-14 NOTE — Progress Notes (Signed)
D: Pt has passive SI but agrees to contract for safety; pt states that her depression level is a 7 out of 10 and her hopelessness is 5 out of 10 (1 low, 10 high); pt writes that she slept fair, that her appetite is good, that her energy level is normal and that her ability to pay attention is improving; pt also verbalizes the need for more positivity within the milieu and that she would appreciate more activities than "just work sheets" A: Pt given emotional support from RN; pt encouraged to come to staff with questions or concerns; pt's medication routine continued R: Pt remains appropriate and cooperative; will continue to monitor

## 2011-09-15 LAB — CBC WITH DIFFERENTIAL/PLATELET
Basophils Absolute: 0 10*3/uL (ref 0.0–0.1)
Basophils Relative: 0 % (ref 0–1)
HCT: 35.4 % — ABNORMAL LOW (ref 36.0–46.0)
Lymphocytes Relative: 26 % (ref 12–46)
MCHC: 30.2 g/dL (ref 30.0–36.0)
Monocytes Absolute: 0.7 10*3/uL (ref 0.1–1.0)
Neutro Abs: 6.7 10*3/uL (ref 1.7–7.7)
Neutrophils Relative %: 64 % (ref 43–77)
Platelets: 334 10*3/uL (ref 150–400)
RDW: 15.6 % — ABNORMAL HIGH (ref 11.5–15.5)
WBC: 10.4 10*3/uL (ref 4.0–10.5)

## 2011-09-15 LAB — COMPREHENSIVE METABOLIC PANEL
ALT: 15 U/L (ref 0–35)
AST: 17 U/L (ref 0–37)
Albumin: 3.6 g/dL (ref 3.5–5.2)
CO2: 26 mEq/L (ref 19–32)
Chloride: 105 mEq/L (ref 96–112)
Creatinine, Ser: 0.84 mg/dL (ref 0.50–1.10)
GFR calc non Af Amer: 75 mL/min — ABNORMAL LOW (ref >90)
Potassium: 4 mEq/L (ref 3.5–5.1)
Sodium: 140 mEq/L (ref 135–145)
Total Bilirubin: 0.1 mg/dL — ABNORMAL LOW (ref 0.3–1.2)

## 2011-09-15 LAB — LITHIUM LEVEL: Lithium Lvl: 0.89 mEq/L (ref 0.80–1.40)

## 2011-09-15 MED ORDER — LITHIUM CARBONATE 300 MG PO CAPS
600.0000 mg | ORAL_CAPSULE | ORAL | Status: DC
  Administered 2011-09-16 – 2011-09-17 (×3): 600 mg via ORAL
  Filled 2011-09-15 (×2): qty 2
  Filled 2011-09-15: qty 4
  Filled 2011-09-15 (×3): qty 2
  Filled 2011-09-15: qty 4
  Filled 2011-09-15: qty 2

## 2011-09-15 NOTE — Progress Notes (Signed)
[facility] MD Progress Note  09/15/2011 3:08 PM  Diagnosis:  Axis I: Bipolar I Disorder - Most Recent Episode - Depressed with Psychotic Features.   The patient was seen today and reports the following:   ADL's: Intact.  Sleep: The patient reports to sleeping much better last night but with vivid dreams. Appetite: The patient reports that her appetite is good today.   Mild>(1-10) >Severe  Hopelessness (1-10): 0  Depression (1-10): 3  Anxiety (1-10): 4  Pain (1-10) 5   Suicidal Ideation: The patient denies any suicidal ideations today.  Plan: No  Intent: No  Means: No   Homicidal Ideation: The patient adamantly denies any homicidal ideations today.  Plan: No  Intent: No.  Means: No   General Appearance/Behavior: The patient remains friendly and cooperative today with this provider with less depressive symptoms today.  Eye Contact: Good.  Speech: Appropriate in rate and volume today with no pressuring noted.  Motor Behavior: wnl.  Level of Consciousness: Alert and Oriented x 3.  Mental Status: Alert and Oriented x 3.  Mood: Appears mild to moderately depressed today.  Affect: Appears mild to moderately constricted.  Anxiety Level: Mild to moderate anxiety reported today.  Thought Process: wnl.  Thought Content: The patient denies any auditory or visual hallucinations today. She also denies any current delusional thinking.  Perception: wnl  Judgment: Good.  Insight: Good.  Cognition: Oriented to person, place and time.  Sleep:  Number of Hours: 6    Vital Signs:Blood pressure 124/84, pulse 88, temperature 98.1 F (36.7 C), temperature source Oral, resp. rate 16, height 5\' 7"  (1.702 m), weight 119.296 kg (263 lb).  Current Medications: Current Facility-Administered Medications  Medication Dose Route Frequency Provider Last Rate Last Dose  . acetaminophen (TYLENOL) tablet 650 mg  650 mg Oral Q6H PRN Curlene Labrum Gonzalo Waymire, MD   650 mg at 09/12/11 1314  . albuterol (PROVENTIL  HFA;VENTOLIN HFA) 108 (90 BASE) MCG/ACT inhaler 2 puff  2 puff Inhalation Daily Curlene Labrum Rabecka Brendel, MD   2 puff at 09/13/11 0816  . amLODipine (NORVASC) tablet 10 mg  10 mg Oral Daily Curlene Labrum Salaya Holtrop, MD   10 mg at 09/15/11 0754  . cholecalciferol (VITAMIN D) tablet 1,000 Units  1,000 Units Oral Q breakfast Ronny Bacon, MD   1,000 Units at 09/15/11 0754  . darifenacin (ENABLEX) 24 hr tablet 7.5 mg  7.5 mg Oral Daily Curlene Labrum Reason Helzer, MD   7.5 mg at 09/15/11 0754  . DULoxetine (CYMBALTA) DR capsule 60 mg  60 mg Oral BID Curlene Labrum Vanessa Kampf, MD   60 mg at 09/15/11 0754  . ferrous sulfate tablet 325 mg  325 mg Oral BID WC Curlene Labrum Temperance Kelemen, MD   325 mg at 09/15/11 0754  . fluticasone (FLONASE) 50 MCG/ACT nasal spray 2 spray  2 spray Each Nare Daily Ronny Bacon, MD   2 spray at 09/14/11 0754  . gabapentin (NEURONTIN) capsule 300 mg  300 mg Oral QID Curlene Labrum Lavaeh Bau, MD   300 mg at 09/15/11 1155  . lithium carbonate capsule 600 mg  600 mg Oral BH-qamhs Shown Dissinger D Aunya Lemler, MD      . loratadine (CLARITIN) tablet 10 mg  10 mg Oral Daily Curlene Labrum Kierre Hintz, MD   10 mg at 09/15/11 0754  . magnesium hydroxide (MILK OF MAGNESIA) suspension 30 mL  30 mL Oral Daily PRN Curlene Labrum Ellah Otte, MD      . meloxicam (MOBIC) tablet 7.5 mg  7.5  mg Oral BID Curlene Labrum Kazden Largo, MD   7.5 mg at 09/15/11 0754  . OLANZapine (ZYPREXA) tablet 10 mg  10 mg Oral QHS Curlene Labrum Merry Pond, MD   10 mg at 09/14/11 2209  . pantoprazole (PROTONIX) EC tablet 40 mg  40 mg Oral Q2200 Curlene Labrum Shareta Fishbaugh, MD   40 mg at 09/14/11 2209  . traZODone (DESYREL) tablet 100 mg  100 mg Oral QHS PRN Ronny Bacon, MD   100 mg at 09/10/11 2154  . DISCONTD: lithium carbonate capsule 600 mg  600 mg Oral BID Curlene Labrum Jaron Czarnecki, MD   600 mg at 09/15/11 0754   Lab Results: No results found for this or any previous visit (from the past 48 hour(s)).  Physical Findings: AIMS: Facial and Oral Movements Muscles of Facial Expression: None, normal Lips and Perioral  Area: None, normal Jaw: None, normal Tongue: None, normal,Extremity Movements Upper (arms, wrists, hands, fingers): None, normal Lower (legs, knees, ankles, toes): None, normal, Trunk Movements Neck, shoulders, hips: None, normal, Overall Severity Severity of abnormal movements (highest score from questions above): None, normal Incapacitation due to abnormal movements: None, normal Patient's awareness of abnormal movements (rate only patient's report): No Awareness, Dental Status Current problems with teeth and/or dentures?: No Does patient usually wear dentures?: No   Review of Systems:  Neurological: The patient denies any headaches today. She denies any seizures or dizziness.  G.I.: The patient denies any constipation or G.I. Upset today.  Musculoskeletal: The patient reports chronic pain related to fibromyalgia which is under poor control today.   Time was spent today discussing with the patient her current symptoms. The patient reports that she slept much better last night but was having vivid nightmares.  The patient reports a good appetite today. The patient reports mild to moderate feelings of sadness, anhedonia and depressed mood and denies any current suicidal or homicidal ideations. She also reports mild to moderate anxiety today. She patient denies any auditory or visual hallucinations or delusional thinking today. She reports that her pain has improved and on a 1-10 today it is a 5. She reports to tolerating her medications well with no medication side effects.   Treatment Plan Summary:  1. Daily contact with patient to assess and evaluate symptoms and progress in treatment.  2. Medication management  3. The patient will deny suicidal ideations or homicidal ideations for 48 hours prior to discharge and have a depression and anxiety rating of 3 or less. The patient will also deny any auditory or visual hallucinations or delusional thinking.  4. The patient will deny any symptoms of  substance withdrawal at time of discharge.   Plan:  1. Will continue the medication Cymbalta at 60 mgs po q am and pm for depression and chronic pain..  2. Will continue the medication Lithium Carbonate at 600 mgs po qam and hs for mood stabilization.  3. Will continue the medication Zyprexa at 10 mgs po qhs - sleep, mood stabilization and psychosis.  4. Will continue the medication Neurontin at 300 mgs po qid for pain and anxiety.  5. Will continue the medication Vitamin D at 1000 Units po q am for low Vitamin D.  6. Will continue the patient on her non-psychiatric medications.  7. Laboratory studies reviewed.  8. Will order a CBC with Diff, CMP, Serum Lithium Level and a Vitamin D level for this evening. 9. Will continue to monitor.   Fabio Wah 09/15/2011, 3:08 PM

## 2011-09-15 NOTE — Progress Notes (Signed)
Pt. Taken down to karoke in her wheelchair. Appears very optimistic and happy today. Denies any SI or HI at this time.

## 2011-09-15 NOTE — Progress Notes (Signed)
Psychoeducational Group Note  Date:  09/15/2011 Time: 1100  Group Topic/Focus:  Overcoming Stress:   The focus of this group is to define stress and help patients assess their triggers.  Participation Level:  Active  Participation Quality:  Appropriate, Attentive, Sharing and Supportive  Affect:  Appropriate  Cognitive:  Appropriate and Oriented  Insight:  Good  Engagement in Group:  Good  Additional Comments:  Additional Comments: Pt participated in a Psychoeducational group of overcoming stress. 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 Pt was paired with a peer and given a form of question to a stress interview. Pt stated reasons why stress is always a negative but it can sometimes be a positive. Pt asked what stresses her out the most pt stated her self. Pt stated that she upset with herself due to her circumstances of not being able to walk long distance without her wheelchair.    Karleen Hampshire Brittini 09/15/2011, 3:24 PM

## 2011-09-15 NOTE — Tx Team (Signed)
  Interdisciplinary Treatment Plan Update (Adult)  Date: 09/15/2011  Time Reviewed: 10:33 AM   Progress in Treatment: Attending groups: Yes Participating in groups:  Yes Taking medication as prescribed: Yes Tolerating medication:  Yes Family/Significant other contact made:   Patient understands diagnosis:  Yes Discussing patient identified problems/goals with staff:  Yes Medical problems stabilized or resolved:  Yes Denies suicidal/homicidal ideation: Yes Issues/concerns per patient self-inventory:  None identified Other: N/A  New problem(s) identified: None Identified  Reason for Continuation of Hospitalization: Anxiety Depression Hallucinations  Interventions implemented related to continuation of hospitalization: mood stabilization, medication monitoring and adjustment, group therapy and psycho education, safety checks q 15 mins  Additional comments: N/A  Estimated length of stay: 2-3 days  Discharge Plan: Return to home and follow-up with Dr. Helane Rima for medication management.  New goal(s): N/A  Review of initial/current patient goals per problem list:    1.  Goal(s): Reduce depressive symptoms  Met:  No  Target date: by discharge  As evidenced by: Reducing depression from a 10 to a 3 as reported by pt.   2.  Goal (s): Reduce/Eliminate suicidal ideation  Met:  No  Target date: by discharge  As evidenced by: pt reporting no SI.    3.  Goal(s): Reduce anxiety symptoms  Met:  No  Target date: by discharge  As evidenced by: Reduce anxiety from a 10 to a 3 as reported by pt.    Attendees: Patient:     Family:     Physician:  Orson Aloe, MD 09/15/2011 10:26 AM  Nursing:      Case Manager:  Barrie Folk RN 09/15/2011 10:28 AM  Counselor:   09/15/2011 10:26 AM  Other:  Juline Patch, LCSW 09/15/2011 10:26 AM  Other:  Berneice Heinrich RN 09/15/2011 10:26 AM  Other:  Omelia Blackwater RN 09/15/2011 10:32 AM   Other:      Scribe for Treatment Team:   [facility]  09/15/2011 10:26 AM

## 2011-09-15 NOTE — Progress Notes (Signed)
Pt attend group and was active and appropriate. Pt played apples to apples and appeared to have enjoyed the therapeutic activity. 

## 2011-09-15 NOTE — Progress Notes (Signed)
Pt. Very cooperative took a long nap and needed help making her bed. No SI or HI and contracts for safety. Pt gets around in wheelechair and uses a cane

## 2011-09-15 NOTE — Progress Notes (Signed)
D: Pt denies SI; pt has a preoccupied mood and an appropriate affect; pt states she is concerned about financial difficulty in obtaining medications when she leaves; pt rates her depression and hopelessness 4 out of 10 (1 low/10 high); pt reports sleeping well, having a normal energy level and that her appetite and ability to pay attention is improving A: Pt given emotional support from RN; pt encouraged to come to staff with concerns and/or questions; pt was referred to case management for help with financial matters; pt's medication routine continued; pt's orders and plan of care reviewed R: Pt remains appropriate and cooperative; will continue to monitor

## 2011-09-15 NOTE — Progress Notes (Signed)
Met with patient in Aftercare Planning Group and provided today's workbook based on Leisure and Lifestyle changes. Patient was pleasant, smiling, and interacted well with group, hyper-verbal at times but redirectable. During Aftercare Planning Group, Case Manager provided psychoeducation on "Suicide Prevention Information." This included descriptions of risk factors for suicide, warning signs that an individual is in crisis and thinking of suicide, and what to do if this occurs. Pt indicated understanding of information provided, and will read brochure given upon discharge. Patient participated actively in the discussion and shared that her anxiety is overwhelming when she thinks about leaving and returning back to her home with just her dog, "Sassy". Patient shared that she becomes paranoid at home and starts to hear "hear things". She states that through groups at [facility] she has discovered that she can look to her dog to see if she hears noises and if the dog responds to the noise then it is real and if not it might "be in my mind". Patient educated on [facility] and need for support groups as well as to pursue volunteer opportunities. Rates depression as 4/10 and anxiety as 6/10 and sense of hopelessness 0/10. Patient concerned about finding a ride at discharge.  Joice Lofts RN MS EdS 09/15/2011  11:00 AM

## 2011-09-16 NOTE — Progress Notes (Signed)
Writer spoke with patient this am and patient stated her nurse administered her night time medications at 2200 on 09/15/2011 although they were not charted.

## 2011-09-16 NOTE — Progress Notes (Signed)
Pt attended discharge planning group and actively participated in group.  SW provided pt with today's workbook.  Pt presents with calm mood and affect.  Pt reports feeling much better today.  Pt reports almost feeling ready to d/c.  Pt rates depression at a 4 and anxiety at a 2 today.  Pt denies SI.  Pt will follow up with Dr. Evelene Croon and [facility] for groups.  No further needs voiced by pt at this time.  Safety planning and suicide prevention discussed.  Pt participated in discussion and acknowledged an understanding of the information provided.       Caitlyn Bautista, LCSWA 09/16/2011  11:13 AM

## 2011-09-16 NOTE — Progress Notes (Signed)
[facility] Group Notes:  (Counselor/Nursing/MHT/Case Management/Adjunct)  09/16/2011 6:42 PM  Type of Therapy:  Psychoeducational Skills  Participation Level:  Active  Participation Quality:  Appropriate and Sharing  Affect:  Appropriate  Cognitive:  Appropriate  Insight:  Good  Engagement in Group:  Good  Engagement in Therapy:  Good  Modes of Intervention:  Activity and Socialization  Summary of Progress/Problems: Staff presented the group with a therapeutic activity entitled "Human Bingo". Staff verbalized group rules and directives to the patient. The purpose of this group increase socialization skills amongst the  patient and their peers. The goal is for each patient to fill in their entire sheet. The patients are encouraged to mingle with each other to find someone to fulfill each box. At the end of the group the staff asked each patient to identify one peer that they had something in common with or if there was anything that surprised them about one of their peers. This patient was very engaged in the activity and was able to socialize with her peers in an appropriate manner.  Staff encouraged the patient to utilize coping skills and to engage in more socialization activities in her community. Patient stated that she had some things in common with one of her peers. Patient shared that she really enjoyed the group and that she will work on being more sociable when she is discharged from the hospital.    Caitlyn Bautista 09/16/2011, 6:42 PM

## 2011-09-16 NOTE — Progress Notes (Signed)
09/16/2011         Time: 1500      Group Topic/Focus: The focus of this group is on enhancing patients' problem solving skills, which involves identifying the problem, brainstorming solutions and choosing and trying a solution.    Participation Level: Active  Participation Quality: Appropriate, Attentive and Supportive  Affect: Appropriate  Cognitive: Oriented   Additional Comments: Patient reports she is looking forward to discharge tomorrow, took a leadership role in the activity and was encouraging to peers.   Cressie Betzler 09/16/2011 3:47 PM

## 2011-09-16 NOTE — Progress Notes (Signed)
[facility] Group Notes:  (Counselor/Nursing/MHT/Case Management/Adjunct)  09/16/2011 10:43 PM  Type of Therapy:  Psychoeducational Skills  Participation Level:  Active  Participation Quality:  Appropriate  Affect:  Appropriate  Cognitive:  Appropriate  Insight:  Good  Engagement in Group:  Good  Engagement in Therapy:  Good  Modes of Intervention:  Education  Summary of Progress/Problems:Pt. States that she had a good day since she spent more time socializing in the dayroom. She also states that she expects to be discharged tomorrow and that she understands that it is negative for her to isolate in her room. Her goal for tomorrow is to prepare for discharge.    Hazle Coca S 09/16/2011, 10:43 PM

## 2011-09-16 NOTE — Progress Notes (Signed)
[facility] Adult Inpatient Family/Significant Other Suicide Prevention Education  Suicide Prevention Education:  Education Completed; Leta Speller, friend, [phone]) has been identified by the patient as the family member/significant other with whom the patient will be residing, and identified as the person(s) who will aid the patient in the event of a mental health crisis (suicidal ideations/suicide attempt).  With written consent from the patient, the family member/significant other has been provided the following suicide prevention education, prior to the and/or following the discharge of the patient.  The suicide prevention education provided includes the following:  Suicide risk factors  Suicide prevention and interventions  National Suicide Hotline telephone number  [facility] assessment telephone number  [facility] Emergency Assistance 911  [facility] and/or Residential Mobile Crisis Unit telephone number  Request made of family/significant other to:  Remove weapons (e.g., guns, rifles, knives), all items previously/currently identified as safety concern.    Remove drugs/medications (over-the-counter, prescriptions, illicit drugs), all items previously/currently identified as a safety concern.  Lanora Manis stated she and Britney have been close for about a year and she has heard about the suicide attempts but has not been around when they take place. She has no safety concerns and believes that Yen seems to be getting much better. Lanora Manis verbalized understanding of suicide prevention information and had no further questions. She stated that Clark has no access to guns.    Billie Lade 09/16/2011, 10:02 AM
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
## 2011-09-16 NOTE — Progress Notes (Signed)
D:Patient pleasant and cooperative.  Patient in room speaking with roommate upon approach.  Patient getting ready for bed.  Patient states she had a great day and she enjoyed Retail banker.  Patient states she had so much fun today that she did not have much time to think about depression.   A: Patient offered encouragement and support.  Staff to monitor Q 15 mins for safety. R:  Patient remains safe on the unit.

## 2011-09-16 NOTE — Progress Notes (Signed)
Psychoeducational Group Note  Date:  09/16/2011 Time:  1100  Group Topic/Focus:  Relapse Prevention Planning:   The focus of this group is to define relapse and discuss the need for planning to combat relapse.  Participation Level:  Active  Participation Quality:  Appropriate, Sharing and Supportive  Affect:  Appropriate and Depressed  Cognitive:  Appropriate  Insight:  Good  Engagement in Group:  Good  Additional Comments:  Pt was active and engaged in group. Pt discussed having a hard time thinking about triggers because she feels her behaviors are all a chemical problem and are things that are out of her control. Pt was prompted to think about things that are within her control such as recognizing her triggers, early warning signs and high risk solutions. Pt identified coping strategies for her early warning signs such as taking a nap for when she is anxious.   Dalia Heading 09/16/2011, 12:46 PM

## 2011-09-16 NOTE — Progress Notes (Signed)
(  D) Patient states that she is "feeling foggy and forgetful". "My depression is better but I am feeling so anxious and I can't get some words out" Attending all groups, affect flat but brightens when engaged by staff."  States she slept well last night, appetite good, energy level normal and ability to pay attention improving. Rates depression at 4/10 and feelings of hopelessness at 0. Denies SI/HI. (A) Encouraged to review coping skills and support system to be utilized at discharge. Encouraged and supported. (R) States "I need to keep busy, not allow anyone to burden me down..ibuprofen need to take better care of myself and love myself." Patient remains mobile utilizing wheelchair. Joice Lofts RN MS EdS 09/16/2011  11:47 AM

## 2011-09-16 NOTE — Progress Notes (Signed)
Psychoeducational Group Note  Date:  09/15/2011 Time:  2150  Group Topic/Focus:  Wrap-Up Group:   The focus of this group is to help patients review their daily goal of treatment and discuss progress on daily workbooks.  Participation Level:  Minimal  Participation Quality:  Appropriate and Attentive  Affect:  Appropriate  Cognitive:  Appropriate  Insight:  Good  Engagement in Group:  Limited  Additional Comments:  Pt attended wrap up group this evening.  Pt was respectful of peers during group but didn't actively participate in karoke.  Aundria Rud, Phala Schraeder L 09/16/2011, 12:23 AM

## 2011-09-17 MED ORDER — OLANZAPINE 10 MG PO TABS: 10.0000 mg | ORAL_TABLET | Freq: Every day | ORAL | Status: DC

## 2011-09-17 MED ORDER — GABAPENTIN 300 MG PO CAPS: 300.0000 mg | ORAL_CAPSULE | Freq: Four times a day (QID) | ORAL | Status: DC

## 2011-09-17 MED ORDER — FERROUS SULFATE 325 (65 FE) MG PO TABS: 325.0000 mg | ORAL_TABLET | Freq: Two times a day (BID) | ORAL | Status: DC

## 2011-09-17 MED ORDER — FEXOFENADINE HCL 180 MG PO TABS: 180.0000 mg | ORAL_TABLET | Freq: Every day | ORAL | Status: DC

## 2011-09-17 MED ORDER — SOLIFENACIN SUCCINATE 5 MG PO TABS: 10.0000 mg | ORAL_TABLET | Freq: Every day | ORAL | Status: DC

## 2011-09-17 MED ORDER — DULOXETINE HCL 60 MG PO CPEP: 60.0000 mg | ORAL_CAPSULE | Freq: Two times a day (BID) | ORAL | Status: DC

## 2011-09-17 MED ORDER — ALBUTEROL SULFATE HFA 108 (90 BASE) MCG/ACT IN AERS: 2.0000 | INHALATION_SPRAY | Freq: Every day | RESPIRATORY_TRACT | Status: DC

## 2011-09-17 MED ORDER — AMLODIPINE BESYLATE 10 MG PO TABS: 10.0000 mg | ORAL_TABLET | Freq: Every day | ORAL | Status: DC

## 2011-09-17 MED ORDER — CHOLINE FENOFIBRATE 135 MG PO CPDR: 135.0000 mg | DELAYED_RELEASE_CAPSULE | Freq: Every day | ORAL | Status: DC

## 2011-09-17 MED ORDER — ESOMEPRAZOLE MAGNESIUM 40 MG PO CPDR: 40.0000 mg | DELAYED_RELEASE_CAPSULE | Freq: Every day | ORAL | Status: DC

## 2011-09-17 MED ORDER — LITHIUM CARBONATE 600 MG PO CAPS: 600.0000 mg | ORAL_CAPSULE | ORAL | Status: DC

## 2011-09-17 MED ORDER — MELOXICAM 7.5 MG PO TABS: 7.5000 mg | ORAL_TABLET | Freq: Two times a day (BID) | ORAL | Status: DC

## 2011-09-17 MED ORDER — FLUTICASONE PROPIONATE 50 MCG/ACT NA SUSP: 2.0000 | Freq: Every day | NASAL | Status: DC

## 2011-09-17 MED ORDER — TRAZODONE HCL 100 MG PO TABS
100.0000 mg | ORAL_TABLET | Freq: Every evening | ORAL | Status: DC | PRN
Start: 2011-09-17 — End: 2012-08-27

## 2011-09-17 NOTE — Progress Notes (Signed)
[facility] Case Management Discharge Plan:  Will you be returning to the same living situation after discharge: Yes,   At discharge, do you have transportation home?:Yes,   Do you have the ability to pay for your medications:Yes,    Interagency Information:     Release of information consent forms completed and in the chart;  Patient's signature needed at discharge.  Patient to Follow up at:  Follow-up Information    Follow up with [facility] on 09/29/2011. (Appointment scheduled at 6:15 pm)    Contact information:   [address] [place], [place] [address] [phone]         Patient denies SI/HI:   Yes,      Safety Planning and Suicide Prevention discussed:  Yes,  with pt and with friend  Barrier to discharge identified:No.  Summary and Recommendations: Pt was cleared for D/C and denies any and all S/I and H/I.    Gevena Mart 09/17/2011, 12:11 PM
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
## 2011-09-17 NOTE — Progress Notes (Signed)
Patient ID: Caitlyn Bautista, female   DOB: 15-Aug-1950, 61 y.o.   MRN: [id number] Pt. attended and participated in aftercare planning group. Pt. accepted information on suicide prevention, warning signs to look for with suicide and crisis line numbers to use. The pt. agreed to call crisis line numbers if having warning signs or having thoughts of suicide. Pt. listed their current anxiety level as 0  and depression level as a 1 on a scale of 1 to 10 with 10 being the high.  Patient indicated that her friend [name] will be picking her up if discharged today and has a follow up with Dr. Evelene Croon.

## 2011-09-17 NOTE — Progress Notes (Signed)
Patient ID: Caitlyn Bautista, female   DOB: 05/27/1950, 61 y.o.   MRN: [id number]  Problem: Bipolar Disorder  D: Pt pleasant, cooperative and out in milieu interacting well with peers. Pt with good hygiene and appetite.  A: Monitor q 15 minutes, encourage staff/peer interaction and group participation. Administer medications as ordered by MD.  R: Pt pleasant, denies SI or plans to harm herself. Pt compliant with HS medications. Pt endorses depression but brightens on approach.

## 2011-09-17 NOTE — Progress Notes (Signed)
Patient ID: Caitlyn Bautista, female   DOB: February 12, 1951, 61 y.o.   MRN: [id number] Pt discharge to home. Pt denies SI/HI/AVH. Pt given discharge instructions, samples, and f/u appt information. Pt states understanding. No distress noted.

## 2011-09-17 NOTE — Progress Notes (Signed)
Patient ID: Caitlyn Bautista, female   DOB: 10-Nov-1950, 61 y.o.   MRN: [id number]  [facility] Notes:  (Counselor/Nursing/MHT/Case Management/Adjunct)  09/17/2011 1:15 PM  Type of Therapy:  Group Therapy, Dance/Movement Therapy   Participation Level:  Active  Participation Quality:  Appropriate  Affect:  Appropriate  Cognitive:  Appropriate  Insight:  Limited  Engagement in Group:  Limited  Engagement in Therapy:  Limited  Modes of Intervention:  Clarification, Problem-solving, Role-play, Socialization and Support  Summary of Progress/Problems:  Therapist discussed self sabotage and enabling behaviors and asked group to define the term.Therapist discussed when our goals are sabotaged, why we tend to choose unhealthy coping mechanisms.  Therapist asked group what healthy actions do you take when feeling stuck and why?  Pt. Stated that my goal is achieving ultimate health and peace.  I read my Bible and go to church.  [name] 09/17/2011. 2:29 PM

## 2011-09-18 NOTE — ED Provider Notes (Signed)
Medical screening examination/treatment/procedure(s) were performed by non-physician practitioner and as supervising physician I was immediately available for consultation/collaboration.  Artelia Game, MD 09/18/11 0704 

## 2011-09-20 NOTE — Progress Notes (Signed)
Patient Discharge Instructions:  After Visit Summary (AVS):   Faxed to:  09/20/2011 Psychiatric Admission Assessment Note:   Faxed to:  09/20/2011 Suicide Risk Assessment - Discharge Assessment:   Faxed to:  09/20/2011 Faxed/Sent to the Next Level Care provider:  09/20/2011  Faxed to [facility] @ [phone]  Wandra Scot, 09/20/2011, 6:47 PM

## 2011-09-23 ENCOUNTER — Ambulatory Visit: Payer: Self-pay | Admitting: Obstetrics and Gynecology
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
## 2011-11-26 NOTE — ED Provider Notes (Signed)
Medical screening examination/treatment/procedure(s) were performed by non-physician practitioner and as supervising physician I was immediately available for consultation/collaboration.  Cheri Guppy, MD 11/26/11 1525

## 2011-12-06 ENCOUNTER — Other Ambulatory Visit (HOSPITAL_COMMUNITY): Payer: Self-pay | Admitting: Physician Assistant

## 2012-03-12 ENCOUNTER — Encounter: Payer: Medicare Other | Admitting: Obstetrics and Gynecology

## 2012-08-27 ENCOUNTER — Ambulatory Visit: Payer: Self-pay | Admitting: Internal Medicine

## 2012-08-27 ENCOUNTER — Ambulatory Visit (INDEPENDENT_AMBULATORY_CARE_PROVIDER_SITE_OTHER): Payer: Medicare Other | Admitting: Internal Medicine

## 2012-08-27 ENCOUNTER — Encounter: Payer: Self-pay | Admitting: Internal Medicine

## 2012-08-27 VITALS — BP 152/98 | HR 97 | Temp 98.1°F | Ht 67.0 in | Wt 282.0 lb

## 2012-08-27 DIAGNOSIS — I10 Essential (primary) hypertension: Secondary | ICD-10-CM

## 2012-08-27 MED ORDER — AMLODIPINE BESYLATE 10 MG PO TABS: 10.0000 mg | ORAL_TABLET | Freq: Every day | ORAL | Status: DC

## 2012-08-27 NOTE — Progress Notes (Signed)
Subjective:    Patient ID: Caitlyn Bautista, female    DOB: 01/30/1951, 62 y.o.   MRN: [id number]  HPI  Pt presents to the clinic today with c/o elevated blood pressure. She has had a history of elevated blood pressure for at least 10 years. She is on Norvasc 10 mg daily. She has not had any medication in the last 3 weeks because her doctor would not refill it until she paid her bill. She is c/o headaches and blurred vision. She was on Diovan prior to Norvasc and not really sure why she was switched. She would like to get back on her BP meds so that she does not have headaches every day.  Review of Systems  Past Medical History  Diagnosis Date  . PTSD (post-traumatic stress disorder)   . Anxiety   . Fibromyalgia   . Bipolar disorder   . Arthritis   . Hypertension   . Thyroid disease   . Carpal tunnel syndrome, bilateral     Current Outpatient Prescriptions  Medication Sig Dispense Refill  . traMADol (ULTRAM) 50 MG tablet Take 50 mg by mouth every 4 (four) hours as needed for pain.      Marland Kitchen albuterol (PROVENTIL HFA;VENTOLIN HFA) 108 (90 BASE) MCG/ACT inhaler Inhale 2 puffs into the lungs daily. For wheezing.  1 Inhaler  0  . amLODipine (NORVASC) 10 MG tablet Take 1 tablet (10 mg total) by mouth daily. For hypertension.  30 tablet  0  . DULoxetine (CYMBALTA) 60 MG capsule Take 1 capsule (60 mg total) by mouth 2 (two) times daily. For anxiety and depression.  60 capsule  1  . esomeprazole (NEXIUM) 40 MG capsule Take 1 capsule (40 mg total) by mouth daily before breakfast. For acid reflux.      . fexofenadine (ALLEGRA) 180 MG tablet Take 1 tablet (180 mg total) by mouth daily. For seasonal allergies.      . fluticasone (FLONASE) 50 MCG/ACT nasal spray Place 2 sprays into the nose daily. For seasonal allergies.      Marland Kitchen gabapentin (NEURONTIN) 300 MG capsule Take 1 capsule (300 mg total) by mouth 4 (four) times daily. For anxiety and neuropathic pain.  120 capsule  0  . lithium carbonate 600 MG  capsule Take 1 capsule (600 mg total) by mouth 2 (two) times daily in the am and at bedtime.. For mood stabilization.  60 capsule  1  . solifenacin (VESICARE) 5 MG tablet Take 2 tablets (10 mg total) by mouth daily. For over active bladder       No current facility-administered medications for this visit.    Allergies  Allergen Reactions  . Ace Inhibitors Other (See Comments)    Cough, asthma attack  . Decongestant (Pseudoephedrine Hcl) Other (See Comments)    High blood pressure  . Nsaids     History reviewed. No pertinent family history.  History   Social History  . Marital Status: Single    Spouse Name: N/A    Number of Children: N/A  . Years of Education: N/A   Occupational History  . Not on file.   Social History Main Topics  . Smoking status: Never Smoker   . Smokeless tobacco: Not on file  . Alcohol Use: No  . Drug Use: No  . Sexually Active: Not on file   Other Topics Concern  . Not on file   Social History Narrative  . No narrative on file     Constitutional: Pt reports headache. Denies  fever, malaise, fatigue, or abrupt weight changes.  HEENT: Pt reports blurred vision. Denies eye pain, eye redness, ear pain, ringing in the ears, wax buildup, runny nose, nasal congestion, bloody nose, or sore throat. Respiratory: Denies difficulty breathing, shortness of breath, cough or sputum production.   Cardiovascular: Pt reports swelling in BLE. Denies chest pain, chest tightness, palpitations or swelling in the hands.  Neurological: Denies dizziness, difficulty with memory, difficulty with speech or problems with balance and coordination.   No other specific complaints in a complete review of systems (except as listed in HPI above).     Objective:   Physical Exam   BP 152/98  Pulse 97  Temp(Src) 98.1 F (36.7 C) (Oral)  Ht 5\' 7"  (1.702 m)  Wt 282 lb (127.914 kg)  BMI 44.16 kg/m2  SpO2 95% Wt Readings from Last 3 Encounters:  08/27/12 282 lb (127.914  kg)  09/10/11 263 lb (119.296 kg)  09/09/11 263 lb 8 oz (119.523 kg)    General: Appears her stated age, obese but well developed, well nourished in NAD. HEENT: Head: normal shape and size; Eyes: sclera white, no icterus, conjunctiva pink, PERRLA and EOMs intact; Ears: Tm's gray and intact, normal light reflex; Nose: mucosa pink and moist, septum midline; Throat/Mouth: Teeth present, mucosa pink and moist, no exudate, lesions or ulcerations noted.   Cardiovascular: Normal rate and rhythm. S1,S2 noted.  No murmur, rubs or gallops noted. No JVD, 1+ pitting edema of BLE. No carotid bruits noted. Pulmonary/Chest: Normal effort and positive vesicular breath sounds. No respiratory distress. No wheezes, rales or ronchi noted.  Neurological: Alert and oriented. Cranial nerves II-XII intact. Coordination normal. +DTRs bilaterally.  BMET    Component Value Date/Time   NA 140 09/15/2011 2013   K 4.0 09/15/2011 2013   CL 105 09/15/2011 2013   CO2 26 09/15/2011 2013   GLUCOSE 112* 09/15/2011 2013   BUN 10 09/15/2011 2013   CREATININE 0.84 09/15/2011 2013   CALCIUM 9.4 09/15/2011 2013   GFRNONAA 75* 09/15/2011 2013   GFRAA 87* 09/15/2011 2013    Lipid Panel  No results found for this basename: chol, trig, hdl, cholhdl, vldl, ldlcalc    CBC    Component Value Date/Time   WBC 10.4 09/15/2011 2013   RBC 3.94 09/15/2011 2013   HGB 10.7* 09/15/2011 2013   HCT 35.4* 09/15/2011 2013   PLT 334 09/15/2011 2013   MCV 89.8 09/15/2011 2013   MCH 27.2 09/15/2011 2013   MCHC 30.2 09/15/2011 2013   RDW 15.6* 09/15/2011 2013   LYMPHSABS 2.7 09/15/2011 2013   MONOABS 0.7 09/15/2011 2013   EOSABS 0.4 09/15/2011 2013   BASOSABS 0.0 09/15/2011 2013    Hgb A1C No results found for this basename: HGBA1C        Assessment & Plan:

## 2012-08-27 NOTE — Patient Instructions (Signed)
Hypertension  As your heart beats, it forces blood through your arteries. This force is your blood pressure. If the pressure is too high, it is called hypertension (HTN) or high blood pressure. HTN is dangerous because you may have it and not know it. High blood pressure may mean that your heart has to work harder to pump blood. Your arteries may be narrow or stiff. The extra work puts you at risk for heart disease, stroke, and other problems.   Blood pressure consists of two numbers, a higher number over a lower, 110/72, for example. It is stated as "110 over 72." The ideal is below 120 for the top number (systolic) and under 80 for the bottom (diastolic). Write down your blood pressure today.  You should pay close attention to your blood pressure if you have certain conditions such as:   Heart failure.   Prior heart attack.   Diabetes   Chronic kidney disease.   Prior stroke.   Multiple risk factors for heart disease.  To see if you have HTN, your blood pressure should be measured while you are seated with your arm held at the level of the heart. It should be measured at least twice. A one-time elevated blood pressure reading (especially in the Emergency Department) does not mean that you need treatment. There may be conditions in which the blood pressure is different between your right and left arms. It is important to see your caregiver soon for a recheck.  Most people have essential hypertension which means that there is not a specific cause. This type of high blood pressure may be lowered by changing lifestyle factors such as:   Stress.   Smoking.   Lack of exercise.   Excessive weight.   Drug/tobacco/alcohol use.   Eating less salt.  Most people do not have symptoms from high blood pressure until it has caused damage to the body. Effective treatment can often prevent, delay or reduce that damage.  TREATMENT    When a cause has been identified, treatment for high blood pressure is directed at the cause. There are a large number of medications to treat HTN. These fall into several categories, and your caregiver will help you select the medicines that are best for you. Medications may have side effects. You should review side effects with your caregiver.  If your blood pressure stays high after you have made lifestyle changes or started on medicines,    Your medication(s) may need to be changed.   Other problems may need to be addressed.   Be certain you understand your prescriptions, and know how and when to take your medicine.   Be sure to follow up with your caregiver within the time frame advised (usually within two weeks) to have your blood pressure rechecked and to review your medications.   If you are taking more than one medicine to lower your blood pressure, make sure you know how and at what times they should be taken. Taking two medicines at the same time can result in blood pressure that is too low.  SEEK IMMEDIATE MEDICAL CARE IF:   You develop a severe headache, blurred or changing vision, or confusion.   You have unusual weakness or numbness, or a faint feeling.   You have severe chest or abdominal pain, vomiting, or breathing problems.  MAKE SURE YOU:    Understand these instructions.   Will watch your condition.   Will get help right away if you are not doing well   or get worse.  Document Released: 02/21/2005 Document Revised: 05/16/2011 Document Reviewed: 10/12/2007  ExitCare Patient Information 2014 [employer]  DASH Diet   The DASH diet stands for "Dietary Approaches to Stop Hypertension." It is a healthy eating plan that has been shown to reduce high blood pressure (hypertension) in as little as 14 days, while also possibly providing other significant health benefits. These other health benefits include reducing the risk of breast cancer after menopause and reducing the risk of type 2 diabetes, heart disease, colon cancer, and stroke. Health benefits also include weight loss and slowing kidney failure in patients with chronic kidney disease.   DIET GUIDELINES   Limit salt (sodium). Your diet should contain less than 1500 mg of sodium daily.   Limit refined or processed carbohydrates. Your diet should include mostly whole grains. Desserts and added sugars should be used sparingly.   Include small amounts of heart-healthy fats. These types of fats include nuts, oils, and tub margarine. Limit saturated and trans fats. These fats have been shown to be harmful in the body.  CHOOSING FOODS   The following food groups are based on a 2000 calorie diet. See your Registered Dietitian for individual calorie needs.  Grains and Grain Products (6 to 8 servings daily)   Eat More Often: Whole-wheat bread, brown rice, whole-grain or wheat pasta, quinoa, popcorn without added fat or salt (air popped).   Eat Less Often: White bread, white pasta, white rice, cornbread.  Vegetables (4 to 5 servings daily)   Eat More Often: Fresh, frozen, and canned vegetables. Vegetables may be raw, steamed, roasted, or grilled with a minimal amount of fat.   Eat Less Often/Avoid: Creamed or fried vegetables. Vegetables in a cheese sauce.  Fruit (4 to 5 servings daily)   Eat More Often: All fresh, canned (in natural juice), or frozen fruits. Dried fruits without added sugar. One hundred percent fruit juice ( cup [237 mL] daily).   Eat Less Often: Dried fruits with added sugar. Canned fruit in light or heavy syrup.   Lean Meats, Fish, and Poultry (2 servings or less daily. One serving is 3 to 4 oz [85-114 g]).   Eat More Often: Ninety percent or leaner ground beef, tenderloin, sirloin. Round cuts of beef, chicken breast, turkey breast. All fish. Grill, bake, or broil your meat. Nothing should be fried.   Eat Less Often/Avoid: Fatty cuts of meat, turkey, or chicken leg, thigh, or wing. Fried cuts of meat or fish.  Dairy (2 to 3 servings)   Eat More Often: Low-fat or fat-free milk, low-fat plain or light yogurt, reduced-fat or part-skim cheese.   Eat Less Often/Avoid: Milk (whole, 2%).Whole milk yogurt. Full-fat cheeses.  Nuts, Seeds, and Legumes (4 to 5 servings per week)   Eat More Often: All without added salt.   Eat Less Often/Avoid: Salted nuts and seeds, canned beans with added salt.  Fats and Sweets (limited)   Eat More Often: Vegetable oils, tub margarines without trans fats, sugar-free gelatin. Mayonnaise and salad dressings.   Eat Less Often/Avoid: Coconut oils, palm oils, butter, stick margarine, cream, half and half, cookies, candy, pie.  FOR MORE INFORMATION  The Dash Diet Eating Plan: www.dashdiet.org  Document Released: 02/10/2011 Document Revised: 05/16/2011 Document Reviewed: 02/10/2011  [employer] Patient Information 2014 [employer]

## 2012-08-28 ENCOUNTER — Ambulatory Visit: Payer: Self-pay | Admitting: Internal Medicine

## 2012-08-28 DIAGNOSIS — I10 Essential (primary) hypertension: Secondary | ICD-10-CM | POA: Insufficient documentation

## 2012-08-28 NOTE — Assessment & Plan Note (Signed)
Uncontrolled but has been out of medications for 3 weeks Will refill Norvasc today BP check in 2 weeks If still elevated, will add Diovan  No diuretic secondary to being on Lithium

## 2012-09-10 ENCOUNTER — Ambulatory Visit: Payer: Self-pay | Admitting: Internal Medicine

## 2012-09-10 DIAGNOSIS — Z0289 Encounter for other administrative examinations: Secondary | ICD-10-CM

## 2012-09-27 ENCOUNTER — Ambulatory Visit: Payer: Self-pay | Admitting: Internal Medicine

## 2012-10-11 ENCOUNTER — Ambulatory Visit (INDEPENDENT_AMBULATORY_CARE_PROVIDER_SITE_OTHER)
Admission: RE | Admit: 2012-10-11 | Discharge: 2012-10-11 | Disposition: A | Discharge status: Home / Self Care | Payer: Medicare Other | Source: Ambulatory Visit | Attending: Internal Medicine | Admitting: Internal Medicine

## 2012-10-11 ENCOUNTER — Ambulatory Visit (INDEPENDENT_AMBULATORY_CARE_PROVIDER_SITE_OTHER): Payer: Medicare Other | Admitting: Internal Medicine

## 2012-10-11 ENCOUNTER — Encounter: Payer: Self-pay | Admitting: Internal Medicine

## 2012-10-11 ENCOUNTER — Other Ambulatory Visit (INDEPENDENT_AMBULATORY_CARE_PROVIDER_SITE_OTHER): Payer: Medicare Other

## 2012-10-11 VITALS — BP 142/90 | HR 82 | Temp 98.8°F | Wt 273.1 lb

## 2012-10-11 DIAGNOSIS — R5381 Other malaise: Secondary | ICD-10-CM

## 2012-10-11 DIAGNOSIS — M5432 Sciatica, left side: Secondary | ICD-10-CM

## 2012-10-11 DIAGNOSIS — M797 Fibromyalgia: Secondary | ICD-10-CM

## 2012-10-11 DIAGNOSIS — J309 Allergic rhinitis, unspecified: Secondary | ICD-10-CM | POA: Insufficient documentation

## 2012-10-11 DIAGNOSIS — R5383 Other fatigue: Secondary | ICD-10-CM

## 2012-10-11 DIAGNOSIS — M5430 Sciatica, unspecified side: Secondary | ICD-10-CM | POA: Insufficient documentation

## 2012-10-11 DIAGNOSIS — F319 Bipolar disorder, unspecified: Secondary | ICD-10-CM

## 2012-10-11 DIAGNOSIS — E785 Hyperlipidemia, unspecified: Secondary | ICD-10-CM

## 2012-10-11 DIAGNOSIS — IMO0001 Reserved for inherently not codable concepts without codable children: Secondary | ICD-10-CM

## 2012-10-11 DIAGNOSIS — I10 Essential (primary) hypertension: Secondary | ICD-10-CM

## 2012-10-11 LAB — COMPREHENSIVE METABOLIC PANEL
ALT: 14 U/L (ref 0–35)
AST: 17 U/L (ref 0–37)
Albumin: 3.5 g/dL (ref 3.5–5.2)
Alkaline Phosphatase: 89 U/L (ref 39–117)
BUN: 7 mg/dL (ref 6–23)
CO2: 27 mEq/L (ref 19–32)
Calcium: 9.2 mg/dL (ref 8.4–10.5)
Chloride: 106 mEq/L (ref 96–112)
Creatinine, Ser: 1.1 mg/dL (ref 0.4–1.2)
GFR: 67.32 mL/min (ref >60.00)
Glucose, Bld: 82 mg/dL (ref 70–99)
Potassium: 3.5 mEq/L (ref 3.5–5.1)
Sodium: 140 mEq/L (ref 135–145)
Total Bilirubin: 0.7 mg/dL (ref 0.3–1.2)
Total Protein: 7.5 g/dL (ref 6.0–8.3)

## 2012-10-11 LAB — LIPID PANEL
Cholesterol: 196 mg/dL (ref 0–200)
HDL: 43 mg/dL (ref >39.00)
LDL Cholesterol: 136 mg/dL — ABNORMAL HIGH (ref 0–99)
Total CHOL/HDL Ratio: 5
Triglycerides: 83 mg/dL (ref 0.0–149.0)
VLDL: 16.6 mg/dL (ref 0.0–40.0)

## 2012-10-11 LAB — CBC
Platelets: 398 10*3/uL (ref 150.0–400.0)
RBC: 3.81 Mil/uL — ABNORMAL LOW (ref 3.87–5.11)

## 2012-10-11 LAB — HEMOGLOBIN A1C: Hgb A1c MFr Bld: 5.2 % (ref 4.6–6.5)

## 2012-10-11 MED ORDER — TRAMADOL HCL 50 MG PO TABS: 50.0000 mg | ORAL_TABLET | Freq: Three times a day (TID) | ORAL | Status: DC | PRN

## 2012-10-11 MED ORDER — AMLODIPINE BESYLATE 10 MG PO TABS: 10.0000 mg | ORAL_TABLET | Freq: Every day | ORAL | Status: DC

## 2012-10-11 NOTE — Assessment & Plan Note (Signed)
Pt has no money for treatment at a rheumatologist Will treat with tramadol Muscle relaxers ineffective

## 2012-10-11 NOTE — Progress Notes (Signed)
HPI  Pt presents to the clinic today to establish care. She is transferring care from Dr. Ned Clines.. She was seeing rheumatology and pain medicine for her fibromyalgia, ortho for the arthritis in her knees. She does see a therapist and psychiatrist for her PTSD and bipolar. She had to stop seeing everyone because she can not afford the copays. She does c/o sciatica down the left side. This has been a chronic issue for her. She has not had any xrays of her back that she is aware of. The pain is so bad at times, she cannot walk. Right now she is only taking Tylenol and Ibuprofen for the pain.  Flu: never Tetanus: unsure of date Pneumovax: unsure of date Pap smear:  07/2012 Mammogram: more than 2 years ago Colonoscopy: 2013 Eye doctor: as needed Dentist: as needed  Past Medical History  Diagnosis Date  . PTSD (post-traumatic stress disorder)   . Anxiety   . Fibromyalgia   . Bipolar disorder   . Arthritis   . Hypertension   . Thyroid disease   . Carpal tunnel syndrome, bilateral   . Asthma   . History of chicken pox   . Depression   . Hyperlipidemia   . Incontinence of urine in female     Current Outpatient Prescriptions  Medication Sig Dispense Refill  . amLODipine (NORVASC) 10 MG tablet Take 1 tablet (10 mg total) by mouth daily. For hypertension.  30 tablet  0  . buPROPion (WELLBUTRIN XL) 150 MG 24 hr tablet Take 150 mg by mouth daily.      . diazepam (VALIUM) 5 MG tablet Take 5 mg by mouth 3 (three) times daily as needed for anxiety.      . DULoxetine (CYMBALTA) 60 MG capsule Take 60 mg by mouth daily.      Marland Kitchen lithium 600 MG capsule Take 1,200 mg by mouth 2 (two) times daily in the am and at bedtime.. For mood stabilization.      Marland Kitchen albuterol (PROVENTIL HFA;VENTOLIN HFA) 108 (90 BASE) MCG/ACT inhaler Inhale 2 puffs into the lungs daily. For wheezing.  1 Inhaler  0  . esomeprazole (NEXIUM) 40 MG capsule Take 1 capsule (40 mg total) by mouth daily before breakfast. For acid  reflux.      . fexofenadine (ALLEGRA) 180 MG tablet Take 1 tablet (180 mg total) by mouth daily. For seasonal allergies.      . fluticasone (FLONASE) 50 MCG/ACT nasal spray Place 2 sprays into the nose daily. For seasonal allergies.      Marland Kitchen solifenacin (VESICARE) 5 MG tablet Take 2 tablets (10 mg total) by mouth daily. For over active bladder      . traMADol (ULTRAM) 50 MG tablet Take 50 mg by mouth every 4 (four) hours as needed for pain.       No current facility-administered medications for this visit.    Allergies  Allergen Reactions  . Ace Inhibitors Other (See Comments)    Cough, asthma attack  . Decongestant (Pseudoephedrine Hcl) Other (See Comments)    High blood pressure  . Nsaids     Family History  Problem Relation Age of Onset  . Arthritis Other     Parent  . Hypertension Other     Parent  . Early death Other     Parent  . Arthritis Other     Grandparent  . Cancer Other     Ovarian/Uterine Cancer-Grandmother  . Hypertension Other     Grandparent  . Arthritis  Other     Other Blood Relative  . Cancer Other     Ovarian/Uterine Cancer-Other Blood Relative  . Breast cancer Other     Other Blood Relative  . Hypertension Other     Other Blood Relative  . Diabetes Other     Other Blood Relative    History   Social History  . Marital Status: Single    Spouse Name: N/A    Number of Children: 4  . Years of Education: 11   Occupational History  . Disabled    Social History Main Topics  . Smoking status: Never Smoker   . Smokeless tobacco: Never Used  . Alcohol Use: Yes     Comment: Rarely  . Drug Use: No  . Sexually Active: Not on file   Other Topics Concern  . Not on file   Social History Narrative   Regular exercise-no   Caffeine Use-yes    ROS:  Constitutional: Pt reports fatigue. Denies fever, malaise, headache or abrupt weight changes.  HEENT: Pt reports blurred vision. Denies eye pain, eye redness, ear pain, ringing in the ears, wax  buildup, runny nose, nasal congestion, bloody nose, or sore throat. Respiratory: Denies difficulty breathing, shortness of breath, cough or sputum production.   Cardiovascular: Denies chest pain, chest tightness, palpitations or swelling in the hands or feet.  Gastrointestinal: Pt reports epigastric pain, that radiates to her back. Denies bloating, constipation, diarrhea or blood in the stool.  GU: Denies frequency, urgency, pain with urination, blood in urine, odor or discharge. Musculoskeletal: Pt reports arthritis of her knees and generalized body aches. Skin: Denies redness, rashes, lesions or ulcercations.  Neurological: Denies dizziness, difficulty with memory, difficulty with speech or problems with balance and coordination.   No other specific complaints in a complete review of systems (except as listed in HPI above).  PE:  BP 142/90  Pulse 82  Temp(Src) 98.8 F (37.1 C) (Oral)  Wt 273 lb 1.9 oz (123.886 kg)  BMI 42.77 kg/m2  SpO2 96% Wt Readings from Last 3 Encounters:  10/11/12 273 lb 1.9 oz (123.886 kg)  08/27/12 282 lb (127.914 kg)  09/10/11 263 lb (119.296 kg)    General: Appears her stated age, obese but well developed, well nourished in NAD. HEENT: Head: normal shape and size; Eyes: sclera white, no icterus, conjunctiva pink, PERRLA and EOMs intact; Ears: Tm's gray and intact, normal light reflex; Nose: mucosa pink and moist, septum midline; Throat/Mouth: Teeth present, mucosa pink and moist, no lesions or ulcerations noted.  Neck: Normal range of motion. Neck supple, trachea midline. Mild superficial swelling on the right side. No massses, lumps or thyromegaly present.  Cardiovascular: Normal rate and rhythm. S1,S2 noted.  No murmur, rubs or gallops noted. No JVD or BLE edema. No carotid bruits noted. Pulmonary/Chest: Normal effort and positive vesicular breath sounds. No respiratory distress. No wheezes, rales or ronchi noted.  Abdomen: Soft and nontender. Normal bowel  sounds, no bruits noted. No distention or masses noted. Liver, spleen and kidneys non palpable. Musculoskeletal: Normal range of motion. No signs of joint swelling. No difficulty with gait. Crepitus noted with ROM of bilateral knees.  Neurological: Alert and oriented. Cranial nerves II-XII intact. Coordination normal. +DTRs bilaterally. Positive straight leg raise on the left.  Psychiatric: Mood normal and affect flat. Behavior is normal. Judgment and thought content normal.    BMET    Component Value Date/Time   NA 140 09/15/2011 2013   K 4.0 09/15/2011 2013  CL 105 09/15/2011 2013   CO2 26 09/15/2011 2013   GLUCOSE 112* 09/15/2011 2013   BUN 10 09/15/2011 2013   CREATININE 0.84 09/15/2011 2013   CALCIUM 9.4 09/15/2011 2013   GFRNONAA 75* 09/15/2011 2013   GFRAA 87* 09/15/2011 2013    Lipid Panel  No results found for this basename: chol, trig, hdl, cholhdl, vldl, ldlcalc    CBC    Component Value Date/Time   WBC 10.4 09/15/2011 2013   RBC 3.94 09/15/2011 2013   HGB 10.7* 09/15/2011 2013   HCT 35.4* 09/15/2011 2013   PLT 334 09/15/2011 2013   MCV 89.8 09/15/2011 2013   MCH 27.2 09/15/2011 2013   MCHC 30.2 09/15/2011 2013   RDW 15.6* 09/15/2011 2013   LYMPHSABS 2.7 09/15/2011 2013   MONOABS 0.7 09/15/2011 2013   EOSABS 0.4 09/15/2011 2013   BASOSABS 0.0 09/15/2011 2013    Hgb A1C No results found for this basename: HGBA1C     Assessment and Plan:  Prevent health:  Will obtain records from prior PCP to assess immunization status Will set you up for your mammogram Encouraged pt to visit eye doctor and dentist at least yearly  Fatigue:  Will check TSH today

## 2012-10-11 NOTE — Assessment & Plan Note (Signed)
Will defer to psych for treatment Will obtain lithium level today and fax to psychiatry

## 2012-10-11 NOTE — Assessment & Plan Note (Signed)
Will recheck lipids today Consume a low fat diet Start to work on diet and exercise

## 2012-10-11 NOTE — Assessment & Plan Note (Signed)
Elevated today but she has been out of her meds for almost 3 weeks Well controlled while on medications Will check labs today Consume a low sodium diet Continue to work on diet and exercise

## 2012-10-11 NOTE — Assessment & Plan Note (Signed)
Seasonal Continue allegra, Flonase and albuterol

## 2012-10-11 NOTE — Patient Instructions (Signed)

## 2012-10-11 NOTE — Assessment & Plan Note (Signed)
Will obtain xray of lumbar spine Most likely will need MRI

## 2012-10-11 NOTE — Assessment & Plan Note (Signed)
Unable to exercise d/t fibromyalgia and arthritis Start calorie counting Consider weight loss surgery

## 2012-10-12 ENCOUNTER — Telehealth: Payer: Self-pay | Admitting: *Deleted

## 2012-10-12 ENCOUNTER — Other Ambulatory Visit: Payer: Self-pay | Admitting: Internal Medicine

## 2012-10-12 DIAGNOSIS — M545 Low back pain: Secondary | ICD-10-CM

## 2012-10-12 NOTE — Telephone Encounter (Signed)
Will order MRI

## 2012-10-12 NOTE — Telephone Encounter (Signed)
Notified pt with regina response...Caitlyn Bautista

## 2012-10-12 NOTE — Telephone Encounter (Signed)
Pt states she has known for several years that she has arthritis in her back. Pretty much all over her body. Pt states it was suspected that something else was going on that's why she wanted to get a MRI done...Raechel Chute

## 2012-10-16 ENCOUNTER — Ambulatory Visit: Payer: Self-pay | Admitting: Internal Medicine

## 2012-10-23 ENCOUNTER — Inpatient Hospital Stay: Admission: RE | Admit: 2012-10-23 | Payer: Self-pay | Source: Ambulatory Visit

## 2012-11-08 ENCOUNTER — Ambulatory Visit
Admission: RE | Admit: 2012-11-08 | Discharge: 2012-11-08 | Disposition: A | Discharge status: Home / Self Care | Payer: Medicare Other | Source: Ambulatory Visit | Attending: Internal Medicine | Admitting: Internal Medicine

## 2012-11-08 DIAGNOSIS — M545 Low back pain: Secondary | ICD-10-CM

## 2012-11-09 ENCOUNTER — Other Ambulatory Visit: Payer: Self-pay | Admitting: Internal Medicine

## 2012-11-09 DIAGNOSIS — G039 Meningitis, unspecified: Secondary | ICD-10-CM

## 2012-12-11 ENCOUNTER — Telehealth: Payer: Self-pay | Admitting: *Deleted

## 2012-12-11 ENCOUNTER — Other Ambulatory Visit: Payer: Self-pay | Admitting: Internal Medicine

## 2012-12-11 DIAGNOSIS — M5412 Radiculopathy, cervical region: Secondary | ICD-10-CM

## 2012-12-11 DIAGNOSIS — G894 Chronic pain syndrome: Secondary | ICD-10-CM

## 2012-12-11 NOTE — Telephone Encounter (Signed)
Will refer to PT , does she need a refill of tramadol?

## 2012-12-11 NOTE — Telephone Encounter (Signed)
She refuses to go back to the Neurosurgeon again.

## 2012-12-11 NOTE — Telephone Encounter (Signed)
Well then her only other option is injections or physical therapy. Injections can be done at the neurosurgeons office

## 2012-12-11 NOTE — Telephone Encounter (Signed)
Im not given her anything stronger than tramadol. I will refer her to pain management if she would like

## 2012-12-11 NOTE — Telephone Encounter (Signed)
Pt called discouraged about appointment with Dr Gerlene Fee.  States the appointment was with a Neurosurgeon instead of Neurologist.  Pt states she is not interested in surgery. Please advise

## 2012-12-11 NOTE — Telephone Encounter (Signed)
Pt states she is willing to try the PT however will need something for pain.  Please advise

## 2012-12-11 NOTE — Telephone Encounter (Signed)
Pt agrees to PT and Pain Management.

## 2012-12-11 NOTE — Telephone Encounter (Signed)
Referral placed for pain management.

## 2012-12-11 NOTE — Telephone Encounter (Signed)
Pt states Tramadol is not effective.  Pt would prefer LorTab, please advise

## 2012-12-18 ENCOUNTER — Other Ambulatory Visit: Payer: Self-pay | Admitting: Neurosurgery

## 2012-12-18 DIAGNOSIS — M542 Cervicalgia: Secondary | ICD-10-CM

## 2012-12-18 DIAGNOSIS — M549 Dorsalgia, unspecified: Secondary | ICD-10-CM

## 2013-01-08 ENCOUNTER — Other Ambulatory Visit: Payer: Self-pay

## 2013-01-08 ENCOUNTER — Encounter: Payer: Self-pay | Admitting: Neurology

## 2013-01-09 ENCOUNTER — Telehealth: Payer: Self-pay | Admitting: Neurology

## 2013-01-09 ENCOUNTER — Ambulatory Visit: Payer: Medicare Other | Admitting: Neurology

## 2013-01-09 NOTE — Telephone Encounter (Signed)
This patient did not show for the appointment today.

## 2013-01-17 ENCOUNTER — Other Ambulatory Visit: Payer: Self-pay

## 2013-01-30 ENCOUNTER — Ambulatory Visit
Admission: RE | Admit: 2013-01-30 | Discharge: 2013-01-30 | Disposition: A | Discharge status: Home / Self Care | Payer: Medicare Other | Source: Ambulatory Visit | Attending: Neurosurgery | Admitting: Neurosurgery

## 2013-01-30 DIAGNOSIS — M542 Cervicalgia: Secondary | ICD-10-CM

## 2013-01-30 DIAGNOSIS — M549 Dorsalgia, unspecified: Secondary | ICD-10-CM

## 2013-02-18 ENCOUNTER — Telehealth: Payer: Self-pay | Admitting: Neurology

## 2013-02-18 ENCOUNTER — Encounter: Payer: Self-pay | Admitting: Neurology

## 2013-02-18 ENCOUNTER — Ambulatory Visit: Payer: Medicare Other | Admitting: Neurology

## 2013-02-18 ENCOUNTER — Encounter: Payer: Self-pay | Admitting: *Deleted

## 2013-02-18 NOTE — Telephone Encounter (Signed)
Posted note on chart; patient not to be reschedule with [facility] per Dr. Anne Hahn.

## 2013-02-18 NOTE — Telephone Encounter (Signed)
This is the second new patient appointment no show for this person. No more visits are to be scheduled through this office.

## 2013-02-26 ENCOUNTER — Other Ambulatory Visit: Payer: Self-pay | Admitting: Internal Medicine

## 2013-02-26 ENCOUNTER — Telehealth: Payer: Self-pay

## 2013-02-26 DIAGNOSIS — M5430 Sciatica, unspecified side: Secondary | ICD-10-CM

## 2013-02-26 NOTE — Telephone Encounter (Signed)
The patient called and is hoping to get a new referral to a neurologist.  Her callback is [phone]

## 2013-02-26 NOTE — Telephone Encounter (Signed)
done

## 2013-03-05 ENCOUNTER — Telehealth: Payer: Self-pay | Admitting: Internal Medicine

## 2013-03-05 NOTE — Telephone Encounter (Signed)
Dr Tat reviewed this patients chart and said they are unable to see this patient as they dont treat pain patients.

## 2013-03-20 ENCOUNTER — Telehealth: Payer: Self-pay

## 2013-03-20 NOTE — Telephone Encounter (Signed)
Pt left v/m pt wants cb ASAP; pt wants to know why second MRI appt at [facility] office was cancelled; pt request results of previous MRI and pt wants to know when will appt be made for pain management.Please advise.

## 2013-03-20 NOTE — Telephone Encounter (Signed)
The MRI got cancelled because the new neurologist would not take her on as a patient. We are also having a hard time finding a pain clinic that will accept it. We are still working on it. Caitlyn Bautista, can you follow up on the pain clinic referral?

## 2013-03-22 NOTE — Telephone Encounter (Signed)
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

## 2013-06-03 ENCOUNTER — Telehealth: Payer: Self-pay | Admitting: *Deleted

## 2013-06-03 NOTE — Telephone Encounter (Signed)
Those were ordered by Dr. Hal Neer, she needs to contact his office

## 2013-06-03 NOTE — Telephone Encounter (Signed)
Pt called requesting cpine and tspine MRI results from November.  Please advise

## 2013-06-03 NOTE — Telephone Encounter (Signed)
Pt called back.  Gave her Dr. Sande Rives office number [phone])

## 2013-06-06 ENCOUNTER — Telehealth: Payer: Self-pay

## 2013-06-06 NOTE — Telephone Encounter (Signed)
Patient's daughter called and wanted to know why patient had been "discharged" from this clinic, and why she keeps getting the run around as to why so was discharged. Informed the daughter that this patient is not a patient @ [facility]. No other information was given to the daughter.

## 2013-06-12 ENCOUNTER — Ambulatory Visit (HOSPITAL_COMMUNITY)
Admission: AD | Admit: 2013-06-12 | Discharge: 2013-06-12 | Disposition: A | Discharge status: Other Acute Inpatient Hospital | Payer: Medicare Other | Source: Intra-hospital | Attending: Psychiatry | Admitting: Psychiatry

## 2013-06-12 ENCOUNTER — Encounter (HOSPITAL_COMMUNITY): Payer: Self-pay | Admitting: *Deleted

## 2013-06-12 DIAGNOSIS — E785 Hyperlipidemia, unspecified: Secondary | ICD-10-CM | POA: Insufficient documentation

## 2013-06-12 DIAGNOSIS — M129 Arthropathy, unspecified: Secondary | ICD-10-CM | POA: Insufficient documentation

## 2013-06-12 DIAGNOSIS — R45851 Suicidal ideations: Secondary | ICD-10-CM | POA: Insufficient documentation

## 2013-06-12 DIAGNOSIS — Z8619 Personal history of other infectious and parasitic diseases: Secondary | ICD-10-CM | POA: Insufficient documentation

## 2013-06-12 DIAGNOSIS — F319 Bipolar disorder, unspecified: Secondary | ICD-10-CM | POA: Insufficient documentation

## 2013-06-12 DIAGNOSIS — IMO0001 Reserved for inherently not codable concepts without codable children: Secondary | ICD-10-CM | POA: Insufficient documentation

## 2013-06-12 DIAGNOSIS — IMO0002 Reserved for concepts with insufficient information to code with codable children: Secondary | ICD-10-CM | POA: Insufficient documentation

## 2013-06-12 DIAGNOSIS — I10 Essential (primary) hypertension: Secondary | ICD-10-CM | POA: Insufficient documentation

## 2013-06-12 DIAGNOSIS — J45909 Unspecified asthma, uncomplicated: Secondary | ICD-10-CM | POA: Insufficient documentation

## 2013-06-12 DIAGNOSIS — Z79899 Other long term (current) drug therapy: Secondary | ICD-10-CM | POA: Insufficient documentation

## 2013-06-12 DIAGNOSIS — F1310 Sedative, hypnotic or anxiolytic abuse, uncomplicated: Secondary | ICD-10-CM | POA: Insufficient documentation

## 2013-06-12 DIAGNOSIS — G8929 Other chronic pain: Secondary | ICD-10-CM | POA: Insufficient documentation

## 2013-06-12 DIAGNOSIS — F411 Generalized anxiety disorder: Secondary | ICD-10-CM | POA: Insufficient documentation
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
## 2013-06-13 ENCOUNTER — Inpatient Hospital Stay (HOSPITAL_COMMUNITY)
Admission: EM | Admit: 2013-06-13 | Discharge: 2013-06-20 | DRG: 885 | Disposition: A | Discharge status: Home / Self Care | Payer: Medicare Other | Source: Intra-hospital | Attending: Psychiatry | Admitting: Psychiatry

## 2013-06-13 ENCOUNTER — Emergency Department (HOSPITAL_COMMUNITY)
Admission: EM | Admit: 2013-06-13 | Discharge: 2013-06-13 | Disposition: A | Discharge status: Home / Self Care | Payer: Medicare Other | Attending: Emergency Medicine | Admitting: Emergency Medicine

## 2013-06-13 ENCOUNTER — Encounter (HOSPITAL_COMMUNITY): Payer: Self-pay | Admitting: Behavioral Health

## 2013-06-13 ENCOUNTER — Encounter (HOSPITAL_COMMUNITY): Payer: Self-pay | Admitting: Emergency Medicine

## 2013-06-13 DIAGNOSIS — I10 Essential (primary) hypertension: Secondary | ICD-10-CM | POA: Diagnosis present

## 2013-06-13 DIAGNOSIS — F4310 Post-traumatic stress disorder, unspecified: Secondary | ICD-10-CM | POA: Diagnosis present

## 2013-06-13 DIAGNOSIS — E785 Hyperlipidemia, unspecified: Secondary | ICD-10-CM | POA: Diagnosis present

## 2013-06-13 DIAGNOSIS — F319 Bipolar disorder, unspecified: Secondary | ICD-10-CM

## 2013-06-13 DIAGNOSIS — F329 Major depressive disorder, single episode, unspecified: Secondary | ICD-10-CM

## 2013-06-13 DIAGNOSIS — F3130 Bipolar disorder, current episode depressed, mild or moderate severity, unspecified: Secondary | ICD-10-CM | POA: Diagnosis present

## 2013-06-13 DIAGNOSIS — R45851 Suicidal ideations: Secondary | ICD-10-CM

## 2013-06-13 DIAGNOSIS — Z79899 Other long term (current) drug therapy: Secondary | ICD-10-CM

## 2013-06-13 DIAGNOSIS — F312 Bipolar disorder, current episode manic severe with psychotic features: Secondary | ICD-10-CM

## 2013-06-13 DIAGNOSIS — F32A Depression, unspecified: Secondary | ICD-10-CM

## 2013-06-13 DIAGNOSIS — Z8659 Personal history of other mental and behavioral disorders: Secondary | ICD-10-CM

## 2013-06-13 DIAGNOSIS — M797 Fibromyalgia: Secondary | ICD-10-CM

## 2013-06-13 DIAGNOSIS — IMO0001 Reserved for inherently not codable concepts without codable children: Secondary | ICD-10-CM | POA: Diagnosis present

## 2013-06-13 HISTORY — DX: Meningitis, unspecified: G03.9

## 2013-06-13 LAB — CBC
HCT: 37.4 % (ref 36.0–46.0)
Hemoglobin: 11.9 g/dL — ABNORMAL LOW (ref 12.0–15.0)
MCH: 28.3 pg (ref 26.0–34.0)
MCHC: 31.8 g/dL (ref 30.0–36.0)
MCV: 88.8 fL (ref 78.0–100.0)
PLATELETS: 409 10*3/uL — AB (ref 150–400)
RBC: 4.21 MIL/uL (ref 3.87–5.11)
RDW: 15 % (ref 11.5–15.5)
WBC: 8.2 10*3/uL (ref 4.0–10.5)

## 2013-06-13 LAB — ETHANOL: Alcohol, Ethyl (B): <11 mg/dL (ref 0–11)

## 2013-06-13 LAB — ACETAMINOPHEN LEVEL

## 2013-06-13 LAB — RAPID URINE DRUG SCREEN, HOSP PERFORMED
AMPHETAMINES: NOT DETECTED
BENZODIAZEPINES: POSITIVE — AB
Barbiturates: NOT DETECTED
COCAINE: NOT DETECTED
OPIATES: NOT DETECTED
TETRAHYDROCANNABINOL: NOT DETECTED

## 2013-06-13 LAB — COMPREHENSIVE METABOLIC PANEL
ALT: 18 U/L (ref 0–35)
AST: 16 U/L (ref 0–37)
Albumin: 3.8 g/dL (ref 3.5–5.2)
Alkaline Phosphatase: 111 U/L (ref 39–117)
BUN: 10 mg/dL (ref 6–23)
CALCIUM: 10 mg/dL (ref 8.4–10.5)
CO2: 26 mEq/L (ref 19–32)
Chloride: 100 mEq/L (ref 96–112)
Creatinine, Ser: 0.74 mg/dL (ref 0.50–1.10)
GFR calc Af Amer: >90 mL/min (ref >90)
GFR calc non Af Amer: >90 mL/min (ref >90)
Glucose, Bld: 87 mg/dL (ref 70–99)
Potassium: 3.7 mEq/L (ref 3.7–5.3)
SODIUM: 139 meq/L (ref 137–147)
TOTAL PROTEIN: 8.7 g/dL — AB (ref 6.0–8.3)
Total Bilirubin: 0.3 mg/dL (ref 0.3–1.2)

## 2013-06-13 LAB — LITHIUM LEVEL: Lithium Lvl: <0.25 mEq/L — ABNORMAL LOW (ref 0.80–1.40)

## 2013-06-13 LAB — SALICYLATE LEVEL: Salicylate Lvl: <2 mg/dL — ABNORMAL LOW (ref 2.8–20.0)

## 2013-06-13 MED ORDER — MAGNESIUM HYDROXIDE 400 MG/5ML PO SUSP: 30.0000 mL | Freq: Every day | ORAL | Status: DC | PRN

## 2013-06-13 MED ORDER — FAMOTIDINE 10 MG PO TABS
10.0000 mg | ORAL_TABLET | Freq: Every day | ORAL | Status: DC
  Administered 2013-06-13 – 2013-06-20 (×8): 10 mg via ORAL
  Filled 2013-06-13 (×10): qty 1

## 2013-06-13 MED ORDER — ALBUTEROL SULFATE HFA 108 (90 BASE) MCG/ACT IN AERS
2.0000 | INHALATION_SPRAY | Freq: Every day | RESPIRATORY_TRACT | Status: DC
  Administered 2013-06-13 – 2013-06-20 (×8): 2 via RESPIRATORY_TRACT
  Filled 2013-06-13 (×2): qty 6.7

## 2013-06-13 MED ORDER — VITAMIN D3 25 MCG (1000 UNIT) PO TABS
1000.0000 [IU] | ORAL_TABLET | Freq: Every day | ORAL | Status: DC
  Administered 2013-06-13 – 2013-06-20 (×8): 1000 [IU] via ORAL
  Filled 2013-06-13 (×11): qty 1

## 2013-06-13 MED ORDER — LITHIUM CARBONATE 300 MG PO CAPS
600.0000 mg | ORAL_CAPSULE | Freq: Two times a day (BID) | ORAL | Status: DC
  Administered 2013-06-13 – 2013-06-18 (×10): 600 mg via ORAL
  Filled 2013-06-13: qty 2
  Filled 2013-06-13: qty 12
  Filled 2013-06-13 (×2): qty 2
  Filled 2013-06-13 (×2): qty 12
  Filled 2013-06-13 (×3): qty 2
  Filled 2013-06-13: qty 12
  Filled 2013-06-13 (×6): qty 2

## 2013-06-13 MED ORDER — DIAZEPAM 5 MG PO TABS
5.0000 mg | ORAL_TABLET | Freq: Three times a day (TID) | ORAL | Status: DC | PRN
  Administered 2013-06-13 – 2013-06-14 (×2): 5 mg via ORAL
  Filled 2013-06-13 (×3): qty 1

## 2013-06-13 MED ORDER — LITHIUM CARBONATE 300 MG PO CAPS
1200.0000 mg | ORAL_CAPSULE | Freq: Two times a day (BID) | ORAL | Status: DC
  Filled 2013-06-13 (×4): qty 4

## 2013-06-13 MED ORDER — TRAMADOL HCL 50 MG PO TABS
50.0000 mg | ORAL_TABLET | Freq: Four times a day (QID) | ORAL | Status: DC | PRN
  Administered 2013-06-14 – 2013-06-18 (×3): 50 mg via ORAL
  Filled 2013-06-13 (×3): qty 1

## 2013-06-13 MED ORDER — LORATADINE 10 MG PO TABS
10.0000 mg | ORAL_TABLET | Freq: Every day | ORAL | Status: DC
  Administered 2013-06-13 – 2013-06-20 (×8): 10 mg via ORAL
  Filled 2013-06-13 (×11): qty 1

## 2013-06-13 MED ORDER — PNEUMOCOCCAL VAC POLYVALENT 25 MCG/0.5ML IJ INJ: 0.5000 mL | INJECTION | INTRAMUSCULAR | Status: DC

## 2013-06-13 MED ORDER — ACETAMINOPHEN 325 MG PO TABS: 650.0000 mg | ORAL_TABLET | Freq: Four times a day (QID) | ORAL | Status: DC | PRN

## 2013-06-13 MED ORDER — BUPROPION HCL ER (XL) 150 MG PO TB24
150.0000 mg | ORAL_TABLET | Freq: Every day | ORAL | Status: DC
  Administered 2013-06-13 – 2013-06-20 (×8): 150 mg via ORAL
  Filled 2013-06-13: qty 3
  Filled 2013-06-13 (×2): qty 1
  Filled 2013-06-13 (×2): qty 3
  Filled 2013-06-13 (×4): qty 1
  Filled 2013-06-13: qty 3
  Filled 2013-06-13: qty 1

## 2013-06-13 MED ORDER — HYDROXYZINE HCL 25 MG PO TABS
25.0000 mg | ORAL_TABLET | Freq: Four times a day (QID) | ORAL | Status: DC | PRN
  Administered 2013-06-13: 25 mg via ORAL
  Filled 2013-06-13: qty 1

## 2013-06-13 MED ORDER — DULOXETINE HCL 60 MG PO CPEP
60.0000 mg | ORAL_CAPSULE | Freq: Two times a day (BID) | ORAL | Status: DC
  Administered 2013-06-14 – 2013-06-20 (×14): 60 mg via ORAL
  Filled 2013-06-13 (×3): qty 1
  Filled 2013-06-13 (×2): qty 6
  Filled 2013-06-13 (×2): qty 1
  Filled 2013-06-13: qty 6
  Filled 2013-06-13 (×2): qty 1
  Filled 2013-06-13 (×2): qty 6
  Filled 2013-06-13 (×3): qty 1
  Filled 2013-06-13 (×3): qty 6
  Filled 2013-06-13: qty 1

## 2013-06-13 MED ORDER — AMLODIPINE BESYLATE 10 MG PO TABS
10.0000 mg | ORAL_TABLET | Freq: Every day | ORAL | Status: DC
  Administered 2013-06-13 – 2013-06-20 (×8): 10 mg via ORAL
  Filled 2013-06-13 (×10): qty 1

## 2013-06-13 MED ORDER — ACETAMINOPHEN 325 MG PO TABS
650.0000 mg | ORAL_TABLET | Freq: Four times a day (QID) | ORAL | Status: DC | PRN
Start: 2013-06-13 — End: 2013-06-20

## 2013-06-13 MED ORDER — ALUM & MAG HYDROXIDE-SIMETH 200-200-20 MG/5ML PO SUSP
30.0000 mL | ORAL | Status: DC | PRN
  Administered 2013-06-16: 30 mL via ORAL

## 2013-06-13 MED ORDER — TRAMADOL HCL 50 MG PO TABS
50.0000 mg | ORAL_TABLET | Freq: Once | ORAL | Status: AC
  Administered 2013-06-13: 50 mg via ORAL
  Filled 2013-06-13: qty 1

## 2013-06-13 MED ORDER — DULOXETINE HCL 60 MG PO CPEP
60.0000 mg | ORAL_CAPSULE | Freq: Every day | ORAL | Status: DC
  Administered 2013-06-13: 60 mg via ORAL
  Filled 2013-06-13 (×4): qty 1

## 2013-06-13 MED ORDER — LITHIUM CARBONATE 300 MG PO CAPS: 600.0000 mg | ORAL_CAPSULE | Freq: Two times a day (BID) | ORAL | Status: DC

## 2013-06-13 NOTE — Progress Notes (Signed)
Patient ID: Caitlyn Bautista, female   DOB: [birth year], 63 y.o.   MRN: [id number] [facility] 0900 AM  The focus of this group is to educate the patient on the purpose and policies of crisis stabilization and provide a format to answer questions about their admission.  The group details unit policies and expectations of patients while admitted.  Patient did not attend group.
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
## 2013-06-13 NOTE — Discharge Instructions (Signed)
Please followup with behavioral health as planned.   Depression, Adult Depression refers to feeling sad, low, down in the dumps, blue, gloomy, or empty. In general, there are two kinds of depression: 1. Depression that we all experience from time to time because of upsetting life experiences, including the loss of a job or the ending of a relationship (normal sadness or normal grief). This kind of depression is considered normal, is short lived, and resolves within a few days to 2 weeks. (Depression experienced after the loss of a loved one is called bereavement. Bereavement often lasts longer than 2 weeks but normally gets better with time.) 2. Clinical depression, which lasts longer than normal sadness or normal grief or interferes with your ability to function at home, at work, and in school. It also interferes with your personal relationships. It affects almost every aspect of your life. Clinical depression is an illness. Symptoms of depression also can be caused by conditions other than normal sadness and grief or clinical depression. Examples of these conditions are listed as follows:  Physical illness Some physical illnesses, including underactive thyroid gland (hypothyroidism), severe anemia, specific types of cancer, diabetes, uncontrolled seizures, heart and lung problems, strokes, and chronic pain are commonly associated with symptoms of depression.  Side effects of some prescription medicine In some people, certain types of prescription medicine can cause symptoms of depression.  Substance abuse Abuse of alcohol and illicit drugs can cause symptoms of depression. SYMPTOMS Symptoms of normal sadness and normal grief include the following:  Feeling sad or crying for short periods of time.  Not caring about anything (apathy).  Difficulty sleeping or sleeping too much.  No longer able to enjoy the things you used to enjoy.  Desire to be by oneself all the time (social  isolation).  Lack of energy or motivation.  Difficulty concentrating or remembering.  Change in appetite or weight.  Restlessness or agitation. Symptoms of clinical depression include the same symptoms of normal sadness or normal grief and also the following symptoms:  Feeling sad or crying all the time.  Feelings of guilt or worthlessness.  Feelings of hopelessness or helplessness.  Thoughts of suicide or the desire to harm yourself (suicidal ideation).  Loss of touch with reality (psychotic symptoms). Seeing or hearing things that are not real (hallucinations) or having false beliefs about your life or the people around you (delusions and paranoia). DIAGNOSIS  The diagnosis of clinical depression usually is based on the severity and duration of the symptoms. Your caregiver also will ask you questions about your medical history and substance use to find out if physical illness, use of prescription medicine, or substance abuse is causing your depression. Your caregiver also may order blood tests. TREATMENT  Typically, normal sadness and normal grief do not require treatment. However, sometimes antidepressant medicine is prescribed for bereavement to ease the depressive symptoms until they resolve. The treatment for clinical depression depends on the severity of your symptoms but typically includes antidepressant medicine, counseling with a mental health professional, or a combination of both. Your caregiver will help to determine what treatment is best for you. Depression caused by physical illness usually goes away with appropriate medical treatment of the illness. If prescription medicine is causing depression, talk with your caregiver about stopping the medicine, decreasing the dose, or substituting another medicine. Depression caused by abuse of alcohol or illicit drugs abuse goes away with abstinence from these substances. Some adults need professional help in order to stop drinking or  using drugs. SEEK IMMEDIATE CARE IF:  You have thoughts about hurting yourself or others.  You lose touch with reality (have psychotic symptoms).  You are taking medicine for depression and have a serious side effect. FOR MORE INFORMATION National Alliance on Mental Illness: www.nami.[employer]: https://carter.com/ Document Released: 02/19/2000 Document Revised: 08/23/2011 Document Reviewed: 05/23/2011 [facility] Patient Information 2014 [place].

## 2013-06-13 NOTE — ED Provider Notes (Signed)
CSN: [id number]     Arrival date & time 06/12/13  2333 History   First MD Initiated Contact with Patient 06/13/13 0211     Chief Complaint  Patient presents with  . Medical Clearance   HPI  Hx provided by the pt.  Pt is a 63 yo female with hx of HTN, hyperlipidemia, fibromyalgia, deprssion, and PTSD who presents for medical clearance.  Patient went to [facility] for increased depression and suicidal ideations due to increased life stress. She reports having several close friends and family members passed away within the last year. She has past history of depression and over the past few days reports overwhelming feeling of sadness. She was sent from [facility] for medical clearance. She does complain of some chronic pains throughout her body and legs related to her fibromyalgia and arthritis. There's been no significant changes. Denies any recent illnesses. She denies any HI. Denies any hallucinations. She has been taking her medications as prescribed.   Past Medical History  Diagnosis Date  . PTSD (post-traumatic stress disorder)   . Anxiety   . Fibromyalgia   . Bipolar disorder   . Arthritis   . Hypertension   . Thyroid disease   . Carpal tunnel syndrome, bilateral   . Asthma   . History of chicken pox   . Depression   . Hyperlipidemia   . Incontinence of urine in female   . Arachnoiditis    Past Surgical History  Procedure Laterality Date  . Tubal ligation    . Breast lumpectomy  1970's  . Abdominal hysterectomy  1990's  . Dilation and curettage of uterus     Family History  Problem Relation Age of Onset  . Arthritis Other     Parent  . Hypertension Other     Parent  . Early death Other     Parent  . Arthritis Other     Grandparent  . Cancer Other     Ovarian/Uterine Cancer-Grandmother  . Hypertension Other     Grandparent  . Arthritis Other     Other Blood Relative  . Cancer Other     Ovarian/Uterine Cancer-Other Blood Relative  . Breast cancer Other     Other Blood  Relative  . Hypertension Other     Other Blood Relative  . Diabetes Other     Other Blood Relative   History  Substance Use Topics  . Smoking status: Never Smoker   . Smokeless tobacco: Never Used  . Alcohol Use: Yes     Comment: Rarely   OB History   Grav Para Term Preterm Abortions TAB SAB Ect Mult Living                 Review of Systems  All other systems reviewed and are negative.     Allergies  Ace inhibitors; Decongestant; and Nsaids  Home Medications   Current Outpatient Rx  Name  Route  Sig  Dispense  Refill  . albuterol (PROVENTIL HFA;VENTOLIN HFA) 108 (90 BASE) MCG/ACT inhaler   Inhalation   Inhale 2 puffs into the lungs daily. For wheezing.   1 Inhaler   0   . amLODipine (NORVASC) 10 MG tablet   Oral   Take 1 tablet (10 mg total) by mouth daily. For hypertension.   90 tablet   1   . buPROPion (WELLBUTRIN XL) 150 MG 24 hr tablet   Oral   Take 150 mg by mouth daily.         Marland Kitchen  diazepam (VALIUM) 5 MG tablet   Oral   Take 5 mg by mouth 3 (three) times daily as needed for anxiety.         . DULoxetine (CYMBALTA) 60 MG capsule   Oral   Take 60 mg by mouth daily.         Marland Kitchen lithium 600 MG capsule   Oral   Take 1,200 mg by mouth 2 (two) times daily with a meal.         . ranitidine (ZANTAC) 75 MG tablet   Oral   Take 75 mg by mouth 2 (two) times daily.         . traMADol (ULTRAM) 50 MG tablet   Oral   Take 1 tablet (50 mg total) by mouth every 8 (eight) hours as needed for pain.   90 tablet   2   . fexofenadine (ALLEGRA) 180 MG tablet   Oral   Take 1 tablet (180 mg total) by mouth daily. For seasonal allergies.         . fluticasone (FLONASE) 50 MCG/ACT nasal spray   Nasal   Place 2 sprays into the nose daily. For seasonal allergies.          BP 156/83  Pulse 71  Temp(Src) 98.1 F (36.7 C) (Oral)  Resp 18  Ht 5\' 7"  (1.702 m)  Wt 273 lb (123.832 kg)  BMI 42.75 kg/m2  SpO2 97% Physical Exam  Nursing note and vitals  reviewed. Constitutional: She is oriented to person, place, and time. She appears well-developed and well-nourished. No distress.  HENT:  Head: Normocephalic.  Cardiovascular: Normal rate and regular rhythm.   Pulmonary/Chest: Effort normal and breath sounds normal.  Abdominal: Soft.  Neurological: She is alert and oriented to person, place, and time.  Skin: Skin is warm and dry. No rash noted.  Psychiatric: Her behavior is normal. She exhibits a depressed mood. She expresses suicidal ideation. She expresses no homicidal ideation. She expresses no suicidal plans.    ED Course  Procedures   COORDINATION OF CARE:  Nursing notes reviewed. Vital signs reviewed. Initial pt interview and examination performed.   Filed Vitals:   06/13/13 0036  BP: 156/83  Pulse: 71  Temp: 98.1 F (36.7 C)  TempSrc: Oral  Resp: 18  Height: 5\' 7"  (1.702 m)  Weight: 273 lb (123.832 kg)  SpO2: 97%    2:35 AM patient seen and evaluated. Patient calm cooperative. Patient has been accepted at [facility].   Labs unremarkable. Patient medically cleared.   Results for orders placed during the hospital encounter of 06/13/13  ACETAMINOPHEN LEVEL      Result Value Ref Range   Acetaminophen (Tylenol), Serum <15.0  10 - 30 ug/mL  CBC      Result Value Ref Range   WBC 8.2  4.0 - 10.5 K/uL   RBC 4.21  3.87 - 5.11 MIL/uL   Hemoglobin 11.9 (*) 12.0 - 15.0 g/dL   HCT 37.4  36.0 - 46.0 %   MCV 88.8  78.0 - 100.0 fL   MCH 28.3  26.0 - 34.0 pg   MCHC 31.8  30.0 - 36.0 g/dL   RDW 15.0  11.5 - 15.5 %   Platelets 409 (*) 150 - 400 K/uL  COMPREHENSIVE METABOLIC PANEL      Result Value Ref Range   Sodium 139  137 - 147 mEq/L   Potassium 3.7  3.7 - 5.3 mEq/L   Chloride 100  96 - 112 mEq/L  CO2 26  19 - 32 mEq/L   Glucose, Bld 87  70 - 99 mg/dL   BUN 10  6 - 23 mg/dL   Creatinine, Ser 0.74  0.50 - 1.10 mg/dL   Calcium 10.0  8.4 - 10.5 mg/dL   Total Protein 8.7 (*) 6.0 - 8.3 g/dL   Albumin 3.8  3.5 - 5.2 g/dL    AST 16  0 - 37 U/L   ALT 18  0 - 35 U/L   Alkaline Phosphatase 111  39 - 117 U/L   Total Bilirubin 0.3  0.3 - 1.2 mg/dL   GFR calc non Af Amer >90  >90 mL/min   GFR calc Af Amer >90  >90 mL/min  ETHANOL      Result Value Ref Range   Alcohol, Ethyl (B) <11  0 - 11 mg/dL  SALICYLATE LEVEL      Result Value Ref Range   Salicylate Lvl <2.8 (*) 2.8 - 20.0 mg/dL  URINE RAPID DRUG SCREEN (HOSP PERFORMED)      Result Value Ref Range   Opiates NONE DETECTED  NONE DETECTED   Cocaine NONE DETECTED  NONE DETECTED   Benzodiazepines POSITIVE (*) NONE DETECTED   Amphetamines NONE DETECTED  NONE DETECTED   Tetrahydrocannabinol NONE DETECTED  NONE DETECTED   Barbiturates NONE DETECTED  NONE DETECTED         MDM   Final diagnoses:  Depression  Suicidal ideation       Martie Lee, PA-C 06/13/13 [phone]

## 2013-06-13 NOTE — [facility] Group Notes (Signed)
[facility] LCSW Group Therapy  06/13/2013  1:15 PM   Type of Therapy:  Group Therapy  Participation Level:  Active  Participation Quality:  Attentive, Sharing and Supportive  Affect:  Depressed and Flat  Cognitive:  Alert and Oriented  Insight:  Developing/Improving and Engaged  Engagement in Therapy:  Developing/Improving and Engaged  Modes of Intervention:  Clarification, Confrontation, Discussion, Education, Exploration, Limit-setting, Orientation, Problem-solving, Rapport Building, Art therapist, Socialization and Support  Summary of Progress/Problems: The topic for group was balance in life.  Today's group focused on defining balance in one's own words, identifying things that can knock one off balance, and exploring healthy ways to maintain balance in life. Group members were asked to provide an example of a time when they felt off balance, describe how they handled that situation,and process healthier ways to regain balance in the future. Group members were asked to share the most important tool for maintaining balance that they learned while at [facility] and how they plan to apply this method after discharge.  Pt shared that she relies heavily on her faith to keep living but feels there's no more fight left in her.  Pt was able to process her history of mental illness and how she has overcome a lot, but struggles to overcome this episode of depression.  Pt was supportive to peers, actively participated and was engaged in group discussion.    Regan Lemming, LCSW 06/13/2013  3:15 PM

## 2013-06-13 NOTE — [facility] Suicide Risk Assessment (Signed)
   Nursing information obtained from:  Patient Demographic factors:  Divorced or widowed;Low socioeconomic status;Living alone;Unemployed Current Mental Status:  Suicidal ideation indicated by patient;Suicide plan;Self-harm thoughts;Belief that plan would result in death Loss Factors:  Loss of significant relationship;Decline in physical health;Financial problems / change in socioeconomic status Historical Factors:  Prior suicide attempts Risk Reduction Factors:  Sense of responsibility to family;Religious beliefs about death Total Time spent with patient: 45 minutes  CLINICAL FACTORS:   Severe Anxiety and/or Agitation Panic Attacks Bipolar Disorder:   Depressive phase Depression:   Anhedonia Hopelessness Impulsivity Insomnia Recent sense of peace/wellbeing Severe Personality Disorders:   Cluster B Comorbid depression Chronic Pain More than one psychiatric diagnosis Previous Psychiatric Diagnoses and Treatments Medical Diagnoses and Treatments/Surgeries  Psychiatric Specialty Exam: Physical Exam  Review of Systems  Respiratory: Positive for cough and sputum production.   Gastrointestinal: Positive for heartburn.  Genitourinary: Positive for urgency.  Musculoskeletal: Positive for back pain, joint pain, myalgias and neck pain.  Neurological: Positive for headaches.  Psychiatric/Behavioral: Positive for depression and suicidal ideas. The patient is nervous/anxious and has insomnia.   All other systems reviewed and are negative.   Blood pressure 132/84, pulse 81, temperature 97.4 F (36.3 C), temperature source Oral, resp. rate 20, height 5\' 7"  (1.702 m), weight 125.193 kg (276 lb), SpO2 100.00%.Body mass index is 43.22 kg/(m^2).  General Appearance: Disheveled  Eye Sport and exercise psychologist::  Fair  Speech:  Clear and Coherent and Slow  Volume:  Decreased  Mood:  Anxious, Depressed, Hopeless, Irritable and Worthless  Affect:  Appropriate, Congruent, Depressed and Tearful  Thought Process:   Coherent, Goal Directed and Intact  Orientation:  Full (Time, Place, and Person)  Thought Content:  Rumination  Suicidal Thoughts:  Yes.  with intent/plan  Homicidal Thoughts:  No  Memory:  Immediate;   Good  Judgement:  Intact  Insight:  Fair  Psychomotor Activity:  Psychomotor Retardation  Concentration:  Good  Recall:  Good  Fund of Knowledge:Good  Language: Good  Akathisia:  NA  Handed:  Right  AIMS (if indicated):     Assets:  Communication Skills Desire for Improvement Financial Resources/Insurance Housing Physical Health Resilience Social Support  Sleep:      Musculoskeletal: Strength & Muscle Tone: within normal limits Gait & Station: normal Patient leans: N/A  COGNITIVE FEATURES THAT CONTRIBUTE TO RISK:  Closed-mindedness Loss of executive function Polarized thinking    SUICIDE RISK:   Moderate:  Frequent suicidal ideation with limited intensity, and duration, some specificity in terms of plans, no associated intent, good self-control, limited dysphoria/symptomatology, some risk factors present, and identifiable protective factors, including available and accessible social support.  PLAN OF CARE: Admit for crisis stabilization, safety monitoring  And medication management of Bipolar, depression, suicidal ideations with plan, partial compliant with medication.   I certify that inpatient services furnished can reasonably be expected to improve the patient's condition.  Caitlyn Bautista 06/13/2013, 10:51 AM

## 2013-06-13 NOTE — Progress Notes (Signed)
Patient ID: Caitlyn Bautista, female   DOB: 1950/03/17, 63 y.o.   MRN: [id number]  D: Pt. Denies SI/HI and A/V Hallucinations to this writer but reports, "I just woke up." Patient reports that she can verbally contract for safety if she does have thoughts to hurt herself or has positive SI. Patient does report pain and received a dose of Tramadol per physician's orders however patient did not experience relief.  A: Support and encouragement provided to the patient. Scheduled medications given to patient per physician's orders.  R: Patient is receptive and cooperative but minimal. Q15 minute checks are maintained for safety.
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
## 2013-06-13 NOTE — Progress Notes (Signed)
Recreation Therapy Notes  Animal-Assisted Activity/Therapy (AAA/T) Program Checklist/Progress Notes Patient Eligibility Criteria Checklist & Daily Group note for Rec Tx Intervention  Date: 04.09.2015 Time: 2:45pm Location: [address] Dayroom    AAA/T Program Assumption of Risk Form signed by Patient/ or Parent Legal Guardian yes  Patient is free of allergies or sever asthma yes  Patient reports no fear of animals yes  Patient reports no history of cruelty to animals yes   Patient understands his/her participation is voluntary yes  Behavioral Response: DID NOT ATTEND.  Jayley Hustead L Machel Violante, LRT/CTRS  Cheyenne Bordeaux L Sarrinah Gardin 06/13/2013 3:56 PM 

## 2013-06-13 NOTE — Tx Team (Signed)
Initial Interdisciplinary Treatment Plan  PATIENT STRENGTHS: (choose at least two) Ability for insight Capable of independent living Communication skills General fund of knowledge Religious Affiliation Special hobby/interest Supportive family/friends  PATIENT STRESSORS: Financial difficulties Health problems Loss of bestfriend to suicide a few months ago   PROBLEM LIST: Problem List/Patient Goals Date to be addressed Date deferred Reason deferred Estimated date of resolution  SI with a plan to poison self 06/13/2013     depression 06/13/2013     anxiety 06/13/2013     Medication non compliance 06/13/2013     Declining health 06/13/2013     Poor finances 06/13/2013                        DISCHARGE CRITERIA:  Ability to meet basic life and health needs Adequate post-discharge living arrangements Improved stabilization in mood, thinking, and/or behavior Medical problems require only outpatient monitoring Need for constant or close observation no longer present Reduction of life-threatening or endangering symptoms to within safe limits Safe-care adequate arrangements made Verbal commitment to aftercare and medication compliance  PRELIMINARY DISCHARGE PLAN: Attend aftercare/continuing care group Attend PHP/IOP Outpatient therapy Participate in family therapy Return to previous living arrangement  PATIENT/FAMIILY INVOLVEMENT: This treatment plan has been presented to and reviewed with the patient, Caitlyn Bautista.  The patient and family have been given the opportunity to ask questions and make suggestions.  [place] 06/13/2013, 6:01 AM
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
## 2013-06-13 NOTE — Progress Notes (Signed)
Psychoeducational Group Note  Date:  06/13/2013 Time:  8:00 p.m.   Group Topic/Focus:  Wrap-Up Group:   The focus of this group is to help patients review their daily goal of treatment and discuss progress on daily workbooks.  Participation Level: Did Not Attend  Participation Quality:  Not Applicable  Affect:  Not Applicable  Cognitive:  Not Applicable  Insight:  Not Applicable  Engagement in Group: Not Applicable  Additional Comments:  The patient refused to attend group for this evening.   Gennette Pac 06/13/2013, 10:13 PM

## 2013-06-13 NOTE — ED Notes (Signed)
Pt brought to ED by [facility] staff. Pt endorses SI with a plan of poisoning herself. Pt states she has a multitude of reasons in her life, health, near loss of family, suicide of close friend.

## 2013-06-13 NOTE — ED Provider Notes (Signed)
Medical screening examination/treatment/procedure(s) were performed by non-physician practitioner and as supervising physician I was immediately available for consultation/collaboration.   EKG Interpretation None       Jeffre Enriques M Elward Nocera, MD 06/13/13 0753 

## 2013-06-14 NOTE — [facility] Suicide Risk Assessment (Addendum)
[place] INPATIENT:  Family/Significant Other Suicide Prevention Education  Suicide Prevention Education:  Education Completed; Barbee Cough - daughter [phone]),  (name of family member/significant other) has been identified by the patient as the family member/significant other with whom the patient will be residing, and identified as the person(s) who will aid the patient in the event of a mental health crisis (suicidal ideations/suicide attempt).  With written consent from the patient, the family member/significant other has been provided the following suicide prevention education, prior to the and/or following the discharge of the patient.  The suicide prevention education provided includes the following:  Suicide risk factors  Suicide prevention and interventions  National Suicide Hotline telephone number  [facility] assessment telephone number  [facility] Emergency Assistance [place] and/or Residential Mobile Crisis Unit telephone number  Request made of family/significant other to:  Remove weapons (e.g., guns, rifles, knives), all items previously/currently identified as safety concern.    Remove drugs/medications (over-the-counter, prescriptions, illicit drugs), all items previously/currently identified as a safety concern.  The family member/significant other verbalizes understanding of the suicide prevention education information provided.  The family member/significant other agrees to remove the items of safety concern listed above.  Imraan Wendell N Horton 06/14/2013, 10:10 AM

## 2013-06-14 NOTE — Progress Notes (Signed)
D  Caitlyn Bautista spent most of the early day laying in her bed this morning. She moves very slowly. She c/o intense , chronic " body" pain all over. A SHe completes her morning self inventory and on it shw writes she cont to have " off and on " SI, she says her depressiion and hopelessness she rated " "8/9"  And says she can and will stay safe.   R Safety is maintained and poc cont'd. She uses a cane to ambulate short distances and uses a wheel chair to go :l ong distances:.

## 2013-06-14 NOTE — Tx Team (Signed)
Interdisciplinary Treatment Plan Update   Date Reviewed:  06/14/2013  Time Reviewed:  9:46 AM  Progress in Treatment:   Attending groups: Yes Participating in groups: Yes Taking medication as prescribed: Yes  Tolerating medication: Yes Family/Significant other contact made:  No, but will ask patient for consent for collateral contact Patient understands diagnosis: Yes  Discussing patient identified problems/goals with staff: Yes Medical problems stabilized or resolved: Yes Denies suicidal/homicidal ideation: Yes Patient has not harmed self or others: Yes  For review of initial/current patient goals, please see plan of care.  Estimated Length of Stay:    Reasons for Continued Hospitalization:  Anxiety Depression Medication stabilization Suicidal ideation  New Problems/Goals identified:    Discharge Plan or Barriers:   Home with outpatient follow up to be determined  Additional Comments:  Attendees:  Patient:  06/14/2013 9:46 AM   Signature: Mylinda Latina, MD 06/14/2013 9:46 AM  Signature:  Nena Polio, PA 06/14/2013 9:46 AM  Signature:   06/14/2013 9:46 AM  Signature:  06/14/2013 9:46 AM  Signature:   Drake Leach,  RN 06/14/2013 9:46 AM  Signature:  Joette Catching, LCSW 06/14/2013 9:46 AM  Signature:   06/14/2013 9:46 AM  Signature:  Lucinda Dell, Care Coordinator [facility] 06/14/2013 9:46 AM  Signature:   06/14/2013 9:46 AM  Signature:  06/14/2013  9:46 AM  Signature:   Lars Pinks, RN [facility] 06/14/2013  9:46 AM  Signature:   06/14/2013  9:46 AM    Scribe for Treatment Team:   Joette Catching,  06/14/2013 9:46 AM

## 2013-06-14 NOTE — Progress Notes (Signed)
[facility] MD Progress Note  06/14/2013 2:47 PM Caitlyn Bautista  MRN:  [id number] Subjective:  Patient states she is better today. Her pain is a 6/10, and depression is a 6/10, anxiety is better also. Says she very much enjoyed her group with Quella to day. Feels like she is starting to come out of her dark place. Diagnosis:   DSM5: Schizophrenia Disorders:   Obsessive-Compulsive Disorders:   Trauma-Stressor Disorders:   Substance/Addictive Disorders:   Depressive Disorders:  Bipolar disorder most recent episode depressed. Total Time spent with patient:  ADL's:  Intact  Sleep: Good  Appetite:  Good  Suicidal Ideation:  Yes with intent and plan Homicidal Ideation:  denies AEB (as evidenced by):  Psychiatric Specialty Exam: Physical Exam  Constitutional: She appears well-developed.    ROS  Blood pressure 120/74, pulse 106, temperature 98.2 F (36.8 C), temperature source Oral, resp. rate 18, height 5\' 7"  (1.702 m), weight 125.193 kg (276 lb), SpO2 100.00%.Body mass index is 43.22 kg/(m^2).  General Appearance: Fairly Groomed  Engineer, water::  Good  Speech:  Clear and Coherent  Volume:  Normal  Mood:  Depressed  Affect:  Congruent  Thought Process:  Goal Directed  Orientation:  Full (Time, Place, and Person)  Thought Content:  WDL  Suicidal Thoughts:  Yes.  without intent/plan  Homicidal Thoughts:  No  Memory:  NA  Judgement:  Fair  Insight:  Shallow  Psychomotor Activity:  Normal  Concentration:  Fair  Recall:  [place] of Knowledge:Fair  Language: Good  Akathisia:  No  Handed:  Left  AIMS (if indicated):     Assets:  Communication Skills Desire for Improvement Resilience  Sleep:  Number of Hours: 5.25   Musculoskeletal: Strength & Muscle Tone: within normal limits Gait & Station: shuffle Patient leans: N/A  Current Medications: Current Facility-Administered Medications  Medication Dose Route Frequency Provider Last Rate Last Dose  . acetaminophen (TYLENOL) tablet  650 mg  650 mg Oral Q6H PRN Nena Polio, PA-C      . albuterol (PROVENTIL HFA;VENTOLIN HFA) 108 (90 BASE) MCG/ACT inhaler 2 puff  2 puff Inhalation Daily Nena Polio, PA-C   2 puff at 06/14/13 0841  . alum & mag hydroxide-simeth (MAALOX/MYLANTA) 200-200-20 MG/5ML suspension 30 mL  30 mL Oral Q4H PRN Nena Polio, PA-C      . amLODipine (NORVASC) tablet 10 mg  10 mg Oral Daily Nena Polio, PA-C   10 mg at 06/14/13 4008  . buPROPion (WELLBUTRIN XL) 24 hr tablet 150 mg  150 mg Oral Daily Nena Polio, PA-C   150 mg at 06/14/13 6761  . cholecalciferol (VITAMIN D) tablet 1,000 Units  1,000 Units Oral Daily Nena Polio, PA-C   1,000 Units at 06/14/13 [phone]  . diazepam (VALIUM) tablet 5 mg  5 mg Oral TID PRN Nena Polio, PA-C   5 mg at 06/13/13 1213  . DULoxetine (CYMBALTA) DR capsule 60 mg  60 mg Oral BID Nena Polio, PA-C   60 mg at 06/14/13 0841  . famotidine (PEPCID) tablet 10 mg  10 mg Oral Daily Nena Polio, PA-C   10 mg at 06/14/13 3267  . hydrOXYzine (ATARAX/VISTARIL) tablet 25 mg  25 mg Oral Q6H PRN Durward Parcel, MD   25 mg at 06/13/13 1212  . lithium carbonate capsule 600 mg  600 mg Oral BID WC Nena Polio, PA-C   600 mg at 06/14/13 0841  . loratadine (CLARITIN) tablet 10 mg  10 mg Oral Daily Nena Polio, PA-C  10 mg at 06/14/13 0842  . magnesium hydroxide (MILK OF MAGNESIA) suspension 30 mL  30 mL Oral Daily PRN Nena Polio, PA-C      . pneumococcal 23 valent vaccine (PNU-IMMUNE) injection 0.5 mL  0.5 mL Intramuscular Tomorrow-1000 Durward Parcel, MD      . traMADol Veatrice Bourbon) tablet 50 mg  50 mg Oral Q6H PRN Nena Polio, PA-C   50 mg at 06/14/13 5277    Lab Results:  Results for orders placed during the hospital encounter of 06/13/13 (from the past 48 hour(s))  LITHIUM LEVEL     Status: Abnormal   Collection Time    06/13/13  6:10 AM      Result Value Ref Range   Lithium Lvl <0.25 (*) 0.80 - 1.40 mEq/L   Comment: REPEATED TO VERIFY      Performed at [facility]    Physical Findings: AIMS: Facial and Oral Movements Muscles of Facial Expression: None, normal Lips and Perioral Area: None, normal Jaw: None, normal Tongue: None, normal,Extremity Movements Upper (arms, wrists, hands, fingers): None, normal Lower (legs, knees, ankles, toes): None, normal, Trunk Movements Neck, shoulders, hips: None, normal, Overall Severity Severity of abnormal movements (highest score from questions above): None, normal Incapacitation due to abnormal movements: None, normal Patient's awareness of abnormal movements (rate only patient's report): No Awareness, Dental Status Current problems with teeth and/or dentures?: No Does patient usually wear dentures?: No  CIWA:    COWS:     Treatment Plan Summary: Daily contact with patient to assess and evaluate symptoms and progress in treatment Medication management  Plan: 1. Continue crisis management and stabilization. 2. Medication management to reduce current symptoms to base line and improve the patient's overall level of functioning. 3. Treat health problems as indicated. 4. Develop treatment plan to decrease risk of relapse upon discharge and to reduce the need for readmission. 5. Psycho-social education regarding relapse prevention and self care. 6. Health care follow up as needed for medical problems. 7. Patient is not a weekend discharge. 8. ElOS: 5-7 days. 9. Lithium level on Sunday AM.  Medical Decision Making Problem Points:  Established problem, stable/improving (1) and Review of psycho-social stressors (1) Data Points:  Review or order medicine tests (1)  I certify that inpatient services furnished can reasonably be expected to improve the patient's condition.   Nena Polio 06/14/2013, 2:47 PM  Reviewed the information documented and agree with the treatment plan.  Durward Parcel 06/14/2013 6:28 PM

## 2013-06-14 NOTE — Progress Notes (Signed)
Patient ID: Caitlyn Bautista, female   DOB: 11/17/1950, 63 y.o.   MRN: [id number] D: pt. Visible on unit in day room, reports pain at "6", but relieved with rest and elevated legs, notes "it's not as bad as it has been" Pt. Currently denies SI. Pt. Later seen laughing and talking while watching lip synching program on TV. A: Writer introduced self to client and encouraged her to verbalize concerns, reaffirmed that staff is here to help here through this difficult time. Staff will continue to monitor q78min for safety. R: Pt. Is safe on the unit, refused karaoke.

## 2013-06-14 NOTE — [facility] Group Notes (Signed)
[facility] LCSW Aftercare Discharge Planning Group Note   06/14/2013 3:03 PM    Participation Quality:  [place]  Mood/Affect:  Appropriate  Depression Rating:  8  Anxiety Rating:  8  Thoughts of Suicide: Yes  Will you contract for safety?   Yes Current AVH:  No  Plan for Discharge/Comments:  Patient attended discharge planning group and actively participated in group.  Patient shared she and CSW have been working on follow up for when she is discharged.    CSW provided all participants with daily workbook.   Transportation Means: Patient has transportation.   Supports:  Patient has a support system.   Reign Bartnick, Eulas Post

## 2013-06-14 NOTE — [facility] Group Notes (Signed)
[facility] LCSW Group Therapy  Feelings Around Relapse 1:15 -2:30        06/14/2013  3:04 PM   Type of Therapy:  Group Therapy  Participation Level:  Appropriate  Participation Quality:  Appropriate  Affect:  Appropriate  Cognitive:  Attentive Appropriate  Insight:  Developing/Improving  Engagement in Therapy: Developing/Improving  Modes of Intervention:  Discussion Exploration Problem-Solving Supportive  Summary of Progress/Problems:  The topic for today was feelings around relapse.    Patient processed feelings toward relapse and was able to relate to peers.Patient shared she goes into isolating having and negative thinking when she relapses.  Patient identified coping skills that can be used to prevent a relapse.   Concha Pyo 06/14/2013 3:04 PM

## 2013-06-15 MED ORDER — DOXEPIN HCL 25 MG PO CAPS
25.0000 mg | ORAL_CAPSULE | Freq: Every evening | ORAL | Status: DC | PRN
  Administered 2013-06-15 – 2013-06-19 (×9): 25 mg via ORAL
  Filled 2013-06-15 (×15): qty 1

## 2013-06-15 MED ORDER — DIAZEPAM 5 MG PO TABS
5.0000 mg | ORAL_TABLET | Freq: Three times a day (TID) | ORAL | Status: DC | PRN
  Administered 2013-06-15 – 2013-06-18 (×4): 5 mg via ORAL
  Filled 2013-06-15 (×4): qty 1

## 2013-06-15 MED ORDER — HYDROXYZINE HCL 25 MG PO TABS: 25.0000 mg | ORAL_TABLET | Freq: Four times a day (QID) | ORAL | Status: DC | PRN

## 2013-06-15 NOTE — Progress Notes (Signed)
Adult Psychoeducational Group Note  Date:  06/15/2013 Time:  1:28 AM  Group Topic/Focus:  Wrap-Up Group:   The focus of this group is to help patients review their daily goal of treatment and discuss progress on daily workbooks.  Participation Level:  Active  Participation Quality:  Appropriate  Affect:  Appropriate  Cognitive:  Alert  Insight: Appropriate  Engagement in Group:  Engaged  Modes of Intervention:  Discussion  Additional Comments:  Pt stated that her day started off rocky, but it got better. Pt also stated that her goal is to remain in the now and to keep moving forward and to not allow the negative suppress her.  Sharmon Revere 06/15/2013, 1:28 AM

## 2013-06-15 NOTE — Progress Notes (Signed)
Writer observed patient up in the dayroom watching tv and interacting with select peers. Writer spoke with patient 1:1 and she reports that she had been isolating at home for the past 4 months and her depression has gotten worst. She reports passive si and verbally contracts for safety. She denies hi/a/v hallucinations. She ambulates with a cane and uses the wheel chair when she feels that her legs are too weak. Writer re-educated her on falls and advised her to use the wheelchair if weakening in her legs worsened. Support and encouragement given, safety maintained on unit with 15 min checks.

## 2013-06-15 NOTE — Progress Notes (Signed)
 .  Psychoeducational Group Note    Date: 06/15/2013 Time:  0930   Goal Setting Purpose of Group: To be able to set a goal that is measurable and that can be accomplished in one day Participation Level:  Active  Participation Quality:  Appropriate  Affect:  Appropriate  Cognitive:  Appropriate  Insight:  Engaged and Improving  Engagement in Group:  Engaged  Additional Comments:  Pt attended the group and partisipated  Kaylena Pacifico A  

## 2013-06-15 NOTE — Progress Notes (Signed)
Patient ID: Caitlyn Bautista, female   DOB: 10-11-1950, 63 y.o.   MRN: [id number] [facility] MD Progress Note  06/15/2013 9:59 AM Caitlyn Bautista  MRN:  [id number] Subjective:  Patient states she is better today but that she feels like she could slip back into severe depression at any moment. Pt rates depression at 7/10 and anxiety at 6/10. Pt reports SI with plan to rat poison and put into food and drinks, contracts for safety while inpatient. Pt denies HI. Reports AVH "like a movie, then like I wake up and I'm reaching or talking or responding to whatever was going on in the movie, like watching the life play out like a movie". Pt reports that she feels like is a "passenger" in her body watching it do things. Pt reports this AVH feeling for "about 1 yr". Pt also hears "like someone walking or moving things or a doorbell." Pt reports that she is active in group and that this is helping her a lot with coping.   Diagnosis:   DSM5: Schizophrenia Disorders:   Obsessive-Compulsive Disorders:   Trauma-Stressor Disorders:   Substance/Addictive Disorders:   Depressive Disorders:  Bipolar disorder most recent episode depressed. Total Time spent with patient:  ADL's:  Intact  Sleep: Good  Appetite:  Good  Suicidal Ideation:  Yes with intent and plan Homicidal Ideation:  denies AEB (as evidenced by):  Psychiatric Specialty Exam: Physical Exam  Constitutional: She appears well-developed.    ROS  Blood pressure 135/85, pulse 108, temperature 98.2 F (36.8 C), temperature source Oral, resp. rate 18, height 5\' 7"  (1.702 m), weight 125.193 kg (276 lb), SpO2 100.00%.Body mass index is 43.22 kg/(m^2).  General Appearance: Fairly Groomed   Engineer, water::  Good   Speech:  Clear and Coherent   Volume:  Normal   Mood:  Depressed   Affect:  Congruent   Thought Process:  Goal Directed  Orientation:  Full (Time, Place, and Person)  Thought Content:  WDL  Suicidal Thoughts:  Yes.  without intent/plan   Homicidal  Thoughts:  No   Memory:  NA  Judgement:  Fair  Insight:  Shallow  Psychomotor Activity:  Normal  Concentration:  Fair  Recall:  [place] of Knowledge:Fair  Language: Good  Akathisia:  No  Handed:  Left  AIMS (if indicated):     Assets:  Communication Skills Desire for Improvement Resilience  Sleep:  Number of Hours: 4.75   Musculoskeletal: Strength & Muscle Tone: within normal limits Gait & Station: shuffle w/cane  Patient leans: N/A  Current Medications: Current Facility-Administered Medications  Medication Dose Route Frequency Provider Last Rate Last Dose  . acetaminophen (TYLENOL) tablet 650 mg  650 mg Oral Q6H PRN Nena Polio, PA-C      . albuterol (PROVENTIL HFA;VENTOLIN HFA) 108 (90 BASE) MCG/ACT inhaler 2 puff  2 puff Inhalation Daily Nena Polio, PA-C   2 puff at 06/15/13 0831  . alum & mag hydroxide-simeth (MAALOX/MYLANTA) 200-200-20 MG/5ML suspension 30 mL  30 mL Oral Q4H PRN Nena Polio, PA-C      . amLODipine (NORVASC) tablet 10 mg  10 mg Oral Daily Nena Polio, PA-C   10 mg at 06/15/13 0830  . buPROPion (WELLBUTRIN XL) 24 hr tablet 150 mg  150 mg Oral Daily Nena Polio, PA-C   150 mg at 06/15/13 0831  . cholecalciferol (VITAMIN D) tablet 1,000 Units  1,000 Units Oral Daily Nena Polio, PA-C   1,000 Units at 06/15/13 0831  . diazepam (VALIUM) tablet  5 mg  5 mg Oral TID PRN Benjamine Mola, FNP      . doxepin (SINEQUAN) capsule 25 mg  25 mg Oral QHS,MR X 1 Benjamine Mola, FNP      . DULoxetine (CYMBALTA) DR capsule 60 mg  60 mg Oral BID Nena Polio, PA-C   60 mg at 06/15/13 0831  . famotidine (PEPCID) tablet 10 mg  10 mg Oral Daily Nena Polio, PA-C   10 mg at 06/15/13 0830  . hydrOXYzine (ATARAX/VISTARIL) tablet 25 mg  25 mg Oral Q6H PRN Benjamine Mola, FNP      . lithium carbonate capsule 600 mg  600 mg Oral BID WC Nena Polio, PA-C   600 mg at 06/15/13 0830  . loratadine (CLARITIN) tablet 10 mg  10 mg Oral Daily Nena Polio, PA-C   10 mg at  06/15/13 0830  . magnesium hydroxide (MILK OF MAGNESIA) suspension 30 mL  30 mL Oral Daily PRN Nena Polio, PA-C      . pneumococcal 23 valent vaccine (PNU-IMMUNE) injection 0.5 mL  0.5 mL Intramuscular Tomorrow-1000 Durward Parcel, MD      . traMADol Veatrice Bourbon) tablet 50 mg  50 mg Oral Q6H PRN Nena Polio, PA-C   50 mg at 06/14/13 1857    Lab Results:  No results found for this or any previous visit (from the past 70 hour(s)).  Physical Findings: AIMS: Facial and Oral Movements Muscles of Facial Expression: None, normal Lips and Perioral Area: None, normal Jaw: None, normal Tongue: None, normal,Extremity Movements Upper (arms, wrists, hands, fingers): None, normal Lower (legs, knees, ankles, toes): None, normal, Trunk Movements Neck, shoulders, hips: None, normal, Overall Severity Severity of abnormal movements (highest score from questions above): None, normal Incapacitation due to abnormal movements: None, normal Patient's awareness of abnormal movements (rate only patient's report): No Awareness, Dental Status Current problems with teeth and/or dentures?: No Does patient usually wear dentures?: No  CIWA:    COWS:     Treatment Plan Summary: Daily contact with patient to assess and evaluate symptoms and progress in treatment Medication management  Plan: 1. Continue crisis management and stabilization. 2. Medication management to reduce current symptoms to base line and improve the patient's overall level of functioning.  -Doxepin 25mg  qhs PRN insomnia/racing thoughts  3. Treat health problems as indicated. 4. Develop treatment plan to decrease risk of relapse upon discharge and to reduce the need for readmission. 5. Psycho-social education regarding relapse prevention and self care. 6. Health care follow up as needed for medical problems. 7. Patient is not a weekend discharge. 8. ElOS: 5-7 days. 9. Lithium level on Sunday AM.  Medical Decision  Making Problem Points:  Established problem, stable/improving (1) and Review of psycho-social stressors (1) Data Points:  Review or order medicine tests (1)  I certify that inpatient services furnished can reasonably be expected to improve the patient's condition.   Benjamine Mola, FNP-BC 06/15/2013, 9:59 AM Patient seen, evaluated and I agree with notes by Nurse Practitioner. Corena Pilgrim, MD

## 2013-06-15 NOTE — Progress Notes (Signed)
The focus of this group is to help patients review their daily goal of treatment and discuss progress on daily workbooks. Pt attended the evening group session and responded to all discussion prompts from the [place]. Pt shared that today was a good day for two reasons, the first of which was the group by the daytime RN, which the Pt said she learned a great deal from. The second good thing about today was that the Pt reported feeling comfortable with her peers and staff. "I feel like I can let my guard down and just be me here. I want to feel that way more often." Pt's only additional need from Nursing Staff this evening was to receive more socks, which were given to Pt following group. Pt's affect was bright and she volunteered several encouraging comments to her peers.

## 2013-06-15 NOTE — [facility] Group Notes (Signed)
[facility] LCSW Group Therapy  06/15/2013 3:48 PM  Type of Therapy:  Group Therapy  Participation Level:  Active  Participation Quality:  Appropriate, Sharing and Supportive  Affect:  Appropriate  Cognitive:  Alert and Oriented  Insight:  Developing/Improving, Engaged and Supportive  Engagement in Therapy:  Developing/Improving, Engaged and Supportive  Modes of Intervention:  Discussion, Education, Exploration, Rapport Building and Support  Summary of Progress/Problems: Pt was engaged and was able to identify positive supports and a coping skill if supports are unavailable.  Pt was supportive of other group members and shared personal experiences.   Katha Hamming 06/15/2013, 3:48 PM

## 2013-06-15 NOTE — Progress Notes (Signed)
Psychoeducational Group Note  Date: 06/15/2013 Time:  1015  Group Topic/Focus:  Identifying Needs:   The focus of this group is to help patients identify their personal needs that have been historically problematic and identify healthy behaviors to address their needs.  Participation Level:  Active  Participation Quality:  Appropriate  Affect:  Appropriate  Cognitive:  Oriented  Insight:  Improving  Engagement in Group:  Engaged  Additional Comments:  Pt attended the group and participated in the discussion  Ellaina Schuler A  

## 2013-06-16 DIAGNOSIS — F3130 Bipolar disorder, current episode depressed, mild or moderate severity, unspecified: Principal | ICD-10-CM

## 2013-06-16 LAB — LITHIUM LEVEL: Lithium Lvl: 0.54 mEq/L — ABNORMAL LOW (ref 0.80–1.40)

## 2013-06-16 NOTE — Progress Notes (Signed)
Psychoeducational Group Note  Date:  06/16/2013 Time:  1015  Group Topic/Focus:  Making Healthy Choices:   The focus of this group is to help patients identify negative/unhealthy choices they were using prior to admission and identify positive/healthier coping strategies to replace them upon discharge.  Participation Level:  Active  Participation Quality:  Appropriate  Affect:  Flat  Cognitive:  Oriented  Insight:  Improving  Engagement in Group:  Engaged  Additional Comments:  Added a lot to the conversation and listened to others when they shared  Alzina Golda A 06/16/2013  

## 2013-06-16 NOTE — [facility] Group Notes (Signed)
[facility] LCSW Group Therapy Note  06/16/2013 1:15PM  Type of Therapy and Topic:  Group Therapy: Stages of Change and Self-Sabotaging, Enabling Behaviors  Participation Level:  Active   Mood: Appropriate  Description of Group:   Learn how to identify obstacles, self-sabotaging and enabling behaviors, what are they, why do we do them and what needs do these behaviors meet? Discuss unhealthy relationships and how to have positive healthy boundaries with those that sabotage and enable. Explore aspects of self-sabotage and enabling in yourself and how to limit these self-destructive behaviors in everyday life.  Therapeutic Goals: 1. Patient will identify one obstacle that relates to self-sabotage and enabling behaviors 2. Patient will identify one personal self-sabotaging or enabling behavior they did prior to admission  3. Patient will demonstrate ability to communicate their needs through discussion and/or role plays. 4. Patient will identify where they are in the Stages of Change Didagram   Summary of Patient Progress: The main focus of today's process group was to have patient identify with what "self-sabotage" means to them and use Motivational Interviewing to discuss what benefits, negative or positive, were involved in a self-identified self-sabotaging behavior. We then talked about reasons the patient may want to change the behavior and any current desire to change.  Patients then had opportunity to identify where they are on the Stages of Change Cycle diagram discussed in group. Caitlyn Bautista was forthcoming in group yet became frustrated with others who made attempts to monopolize group to the point that she ultimately left group.  [name] was seen speaking one to one with an Therapist, sports after group.  Patient was able to identify 'procrastination' as one of the things that often can get in her way. She identified 'preparation' as where she is currently.  Therapeutic Modalities:   Cognitive Behavioral  Therapy Person-Centered Therapy Motivational Interviewing   [name], LCSW

## 2013-06-16 NOTE — Progress Notes (Signed)
Patient ID: Caitlyn Bautista, female   DOB: 1950-05-31, 63 y.o.   MRN: [id number] [facility] MD Progress Note  06/16/2013 5:25 PM Caitlyn Bautista  MRN:  [id number] Subjective:  Patient states she is better today but she woke up with no energy and feeling kind of blah. Pt rates depression at 5/10 and anxiety at 6/10. She is actively partcipating in groups which she is learning about new coping skills, and low self esteem. However she does report that one of her peers became very disturbing during group, causing her to become angry and stirred up. She was able to digress with the nursing staff and turn her anger into positive thinking. Pt denies SI/HI/AVH. She does state that she is not ready to go, and she wants to be able to leave here with a clear mind, and develop ways to channel her thoughts.  Diagnosis:   DSM5: Schizophrenia Disorders:   Obsessive-Compulsive Disorders:   Trauma-Stressor Disorders:   Substance/Addictive Disorders:   Depressive Disorders:  Bipolar disorder most recent episode depressed. Total Time spent with patient:  ADL's:  Intact  Sleep: Good  Appetite:  Good  Suicidal Ideation:  Yes with intent and plan Homicidal Ideation:  denies AEB (as evidenced by):  Psychiatric Specialty Exam: Physical Exam  Constitutional: She is oriented to person, place, and time. She appears well-developed.  HENT:  Head: Normocephalic.  Neck: Normal range of motion.  Respiratory: Effort normal.  GI: Soft.  Musculoskeletal:  Gait is abnormal, is using a cane for mobility.   Neurological: She is alert and oriented to person, place, and time.  Skin: Skin is warm and dry.    Review of Systems  Psychiatric/Behavioral: Positive for depression, suicidal ideas and hallucinations. Negative for substance abuse. The patient is nervous/anxious. The patient does not have insomnia.     Blood pressure 157/84, pulse 109, temperature 98.7 F (37.1 C), temperature source Oral, resp. rate 20, height 5\' 7"  (1.702  m), weight 125.193 kg (276 lb), SpO2 100.00%.Body mass index is 43.22 kg/(m^2).  General Appearance: Fairly Groomed   Engineer, water::  Good   Speech:  Clear and Coherent   Volume:  Normal   Mood:  Depressed   Affect:  Congruent   Thought Process:  Goal Directed  Orientation:  Full (Time, Place, and Person)  Thought Content:  WDL  Suicidal Thoughts:  Yes.  without intent/plan   Homicidal Thoughts:  No   Memory:  NA  Judgement:  Fair  Insight:  Shallow  Psychomotor Activity:  Normal  Concentration:  Fair  Recall:  [place] of Knowledge:Fair  Language: Good  Akathisia:  No  Handed:  Left  AIMS (if indicated):     Assets:  Communication Skills Desire for Improvement Resilience  Sleep:  Number of Hours: 3.5   Musculoskeletal: Strength & Muscle Tone: within normal limits Gait & Station: shuffle w/cane  Patient leans: N/A  Current Medications: Current Facility-Administered Medications  Medication Dose Route Frequency Provider Last Rate Last Dose  . acetaminophen (TYLENOL) tablet 650 mg  650 mg Oral Q6H PRN Nena Polio, PA-C      . albuterol (PROVENTIL HFA;VENTOLIN HFA) 108 (90 BASE) MCG/ACT inhaler 2 puff  2 puff Inhalation Daily Nena Polio, PA-C   2 puff at 06/16/13 0916  . alum & mag hydroxide-simeth (MAALOX/MYLANTA) 200-200-20 MG/5ML suspension 30 mL  30 mL Oral Q4H PRN Nena Polio, PA-C      . amLODipine (NORVASC) tablet 10 mg  10 mg Oral Daily Nena Polio, PA-C  10 mg at 06/16/13 0916  . buPROPion (WELLBUTRIN XL) 24 hr tablet 150 mg  150 mg Oral Daily Nena Polio, PA-C   150 mg at 06/16/13 7829  . cholecalciferol (VITAMIN D) tablet 1,000 Units  1,000 Units Oral Daily Nena Polio, PA-C   1,000 Units at 06/16/13 0917  . diazepam (VALIUM) tablet 5 mg  5 mg Oral TID PRN Benjamine Mola, FNP   5 mg at 06/15/13 2222  . doxepin (SINEQUAN) capsule 25 mg  25 mg Oral QHS,MR X 1 Benjamine Mola, FNP   25 mg at 06/15/13 2222  . DULoxetine (CYMBALTA) DR capsule 60 mg  60 mg  Oral BID Nena Polio, PA-C   60 mg at 06/16/13 0917  . famotidine (PEPCID) tablet 10 mg  10 mg Oral Daily Nena Polio, PA-C   10 mg at 06/16/13 5621  . hydrOXYzine (ATARAX/VISTARIL) tablet 25 mg  25 mg Oral Q6H PRN Benjamine Mola, FNP      . lithium carbonate capsule 600 mg  600 mg Oral BID WC Nena Polio, PA-C   600 mg at 06/16/13 0917  . loratadine (CLARITIN) tablet 10 mg  10 mg Oral Daily Nena Polio, PA-C   10 mg at 06/16/13 0917  . magnesium hydroxide (MILK OF MAGNESIA) suspension 30 mL  30 mL Oral Daily PRN Nena Polio, PA-C      . pneumococcal 23 valent vaccine (PNU-IMMUNE) injection 0.5 mL  0.5 mL Intramuscular Tomorrow-1000 Durward Parcel, MD      . traMADol Veatrice Bourbon) tablet 50 mg  50 mg Oral Q6H PRN Nena Polio, PA-C   50 mg at 06/14/13 1857    Lab Results:  Results for orders placed during the hospital encounter of 06/13/13 (from the past 48 hour(s))  LITHIUM LEVEL     Status: Abnormal   Collection Time    06/16/13  6:38 AM      Result Value Ref Range   Lithium Lvl 0.54 (*) 0.80 - 1.40 mEq/L   Comment: Performed at [facility]    Physical Findings: AIMS: Facial and Oral Movements Muscles of Facial Expression: None, normal Lips and Perioral Area: None, normal Jaw: None, normal Tongue: None, normal,Extremity Movements Upper (arms, wrists, hands, fingers): None, normal Lower (legs, knees, ankles, toes): None, normal, Trunk Movements Neck, shoulders, hips: None, normal, Overall Severity Severity of abnormal movements (highest score from questions above): None, normal Incapacitation due to abnormal movements: None, normal Patient's awareness of abnormal movements (rate only patient's report): No Awareness, Dental Status Current problems with teeth and/or dentures?: No Does patient usually wear dentures?: No  CIWA:    COWS:     Treatment Plan Summary: Daily contact with patient to assess and evaluate symptoms and progress in  treatment Medication management  Plan: 1. Continue crisis management and stabilization. 2. Medication management to reduce current symptoms to base line and improve the patient's overall level of functioning.  -Doxepin 25mg  qhs PRN insomnia/racing thoughts  3. Treat health problems as indicated. 4. Develop treatment plan to decrease risk of relapse upon discharge and to reduce the need for readmission. 5. Psycho-social education regarding relapse prevention and self care. 6. Health care follow up as needed for medical problems. 7. Patient is not a weekend discharge. 8. ElOS: 5-7 days. 9. Lithium level on Sunday AM.  Medical Decision Making Problem Points:  Established problem, stable/improving (1) and Review of psycho-social stressors (1) Data Points:  Review or order medicine tests (1)  I certify  that inpatient services furnished can reasonably be expected to improve the patient's condition.   Nanci Pina, FNP-BC 06/16/2013, 5:25 PM

## 2013-06-16 NOTE — Progress Notes (Signed)
Pt rescinded 72 hr request for DC today. Placed on her chart per policy.

## 2013-06-16 NOTE — Progress Notes (Signed)
Writer has observed patient up in the dayroom watching tv and interacting appropriately with peers. She reports that she has had a better day and is experiencing mild pain wheich she reports that she can tolerate. Patient attended group and participated, is compliant with her medications. She reports passive si and verbally contracts. She denies hi/a/v hallucinations. Safety maintained on unit with 15 min checks.

## 2013-06-16 NOTE — Progress Notes (Addendum)
Caitlyn Bautista is seen OOB UAL: on the 500 hall today. She uses her cane when she walks, moving very very slowly and stiffly. She says she is in constant pain from her arthritis and her fibromyalgia and says she  Wil I  be getting in the W/c for the " rest of the day" if " I don't start feeling better soon".   A SHe completes her morning self inventory and on it she writes she cont to be positive for SI within the previous 24 hrs ( but she easily contracts verbally with this nurse   When  she is asked to elaborate on her suicidal feelings . She is less anxious and is seen late this afternoon....UAL on the unit she is housed on.    A Her wounds are removed of old dressings, , all 3 wound beds are clean, without any s/sx infection and /or staph aureus and clean non stick telfa drsgs are secured to her forehead, right face and mid underneath her right jaw line  area.

## 2013-06-16 NOTE — Progress Notes (Signed)
Psychoeducational Group Note  Date: 06/16/2013 Time: 0930  Group Topic/Focus:  Making Healthy Choices:   The focus of this group is to help patients identify negative/unhealthy choices they were using prior to admission and identify positive/healthier coping strategies to replace them upon discharge.  Participation Level:  Active  Participation Quality:  Appropriate  Affect:  Appropriate  Cognitive:  Appropriate  Insight:  Developing/Improving  Engagement in Group:  Engaged  Additional Comments:    06/16/2013,7:34 PM [place]

## 2013-06-16 NOTE — Progress Notes (Signed)
Writer spoke with patient 1:1 and she reports that she has had a great day and was pleasantly surprised by her 4 daughters and grandchildren coming to visit her from [place]. She reported that their taking the time to visit her gave her the courage to want to fight her depression and she is glad that she came here. She has been observed up in the dayroom laughing and talking with select peers. She currently denies si/hi/a/v hallucinations. Writer praised patient for her determination and will power to fight her depression. Safety maintained on unit with 15 min checks.

## 2013-06-17 MED ORDER — DULOXETINE HCL 60 MG PO CPEP: 60.0000 mg | ORAL_CAPSULE | Freq: Two times a day (BID) | ORAL | Status: DC

## 2013-06-17 MED ORDER — VITAMIN D 1000 UNITS PO TABS: 1000.0000 [IU] | ORAL_TABLET | Freq: Every day | ORAL | Status: DC

## 2013-06-17 MED ORDER — LITHIUM CARBONATE 600 MG PO CAPS: 600.0000 mg | ORAL_CAPSULE | Freq: Two times a day (BID) | ORAL | Status: DC

## 2013-06-17 MED ORDER — BUPROPION HCL ER (XL) 150 MG PO TB24: 150.0000 mg | ORAL_TABLET | Freq: Every day | ORAL | Status: DC

## 2013-06-17 MED ORDER — DIAZEPAM 5 MG PO TABS: 5.0000 mg | ORAL_TABLET | Freq: Three times a day (TID) | ORAL | Status: DC | PRN

## 2013-06-17 MED ORDER — RANITIDINE HCL 75 MG PO TABS: 75.0000 mg | ORAL_TABLET | Freq: Two times a day (BID) | ORAL | Status: DC

## 2013-06-17 NOTE — Progress Notes (Signed)
D:  Patient's self inventory sheet, patient sleeps well, good appetite, normal energy level, good attention span.  Rated depression 4, hopeless 1, anxiety 5.  Denied withdrawals.  Has experienced pain in past 24 hours.  Pain goal 3-4, worst pain 6.  Plans to keep positive thoughts not allow room for negativity to take her over, become more active as best as possible.  Would like home health aid for assistance.  Stated she needs 90 day supply of medications after discharge.  Has financial problems. A:  Medications administered per MD orders.  Emotional support and encouragement given patient. R:  Denied SI and HI.  Denied A/V hallucinations.  Will continue to monitor patient for safety with 15 minute checks.  Safety maintained.

## 2013-06-17 NOTE — [facility] Group Notes (Signed)
[facility] LCSW Group Therapy  06/17/2013 3:52 PM  Type of Therapy:  Group Therapy  Participation Level:  Active  Participation Quality:  Appropriate  Affect:  Appropriate  Cognitive:  Appropriate  Insight:  Engaged  Engagement in Therapy:  Engaged  Modes of Intervention:  Clarification, Discussion, Education, Exploration, Problem-solving, Rapport Building and Support  Summary of Progress/Problems:   The main focus of today's group was overcoming obstacles. She shared the obstacle she has to overcome is lack of resources. Patient stated she is willing to come to [facility] but waiting to learn more about co-payment. Writer advised during this note patient's co-pay would be $40 per visit. Patient has shared she shared fiances is also an obstacle she has to overcome. Patient able to identify appropriate coping skills.  Aviana Shevlin Hairston Phuc Kluttz 06/17/2013, 3:52 PM

## 2013-06-17 NOTE — [facility] Group Notes (Signed)
.  [facility] LCSW Aftercare Discharge Planning Group Note   06/17/2013 10:51 AM    Participation Quality:  [place]ropraite  Mood/Affect:  Appropriate  Depression Rating:  5  Anxiety Rating:  4  Thoughts of Suicide:  No  Will you contract for safety?   NA  Current AVH:  No  Plan for Discharge/Comments:  Patient attended discharge planning group and actively participated in group.  She will [facility].  CSW provided all participants with daily workbook.   Transportation Means: Patient has transportation.   Supports:  Patient has a support system.   Gabrielle Mester, [place]

## 2013-06-17 NOTE — Progress Notes (Addendum)
[facility] Adult Case Management Discharge Plan :  Will you be returning to the same living situation after discharge: Yes,  Patient is returning to her home. At discharge, do you have transportation home?:Yes,  Patient to arrange transporation home. Do you have the ability to pay for your medications:Yes,  Patient is able to obtain medicaitons.  Release of information consent forms completed and in the chart;  Patient's signature needed at discharge.  Patient to Follow up at: Follow-up Information   Follow up with [facility] On 06/27/2013. (Appointment scheduled at 3:00 pm on this date with Lexine Baton for therapy.)    Contact information:   [address] [place], [place] [address] Phone: [phone] Fax: [phone]      Follow up with [facility] On 06/25/2013. (Appointment scheduled at 2:50 pm on this date with Dr. Darleene Cleaver for medication management)    Contact information:   [address] [place], [place] [address] Phone: [phone] Fax: [phone]      Patient denies SI/HI: Patient no longer endorsing SI/HI or other thoughts of self harm.   Safety Planning and Suicide Prevention discussed: .Reviewed with all patients during discharge planning group.  Discussed with pt and pt's daughter.  See suicide prevention education note.    Regan Lemming, LCSW 06/20/2013  10:54 AM

## 2013-06-17 NOTE — Progress Notes (Signed)
Pt observed sitting in the dayroom watching TV and interacting with peers.  Pt reports she has been upset all day, because she was told she was being discharged this morning, and she does not feel she is ready to be sent home.  She says she was feeling better after she had a visit from family in [facility] yesterday, but she still struggles with her feelings of depression and thoughts of suicide.  She feels that the fact that her insurance will not pay after today is more important than her well being.  She says she feels that she needs more information and coping skills.  She is hoping to stay until Wednesday.  Pt says she is having passive thoughts to kill herself.  She contracts for safety on the unit, but if she were to discharge now, she might go home and kill herself.  Pt denies HI/AV.  Pt says she has been attending groups and participating.  She just wants to feel better emotionally.  Support and encouragement offered.  Safety maintained with q15 minute checks.

## 2013-06-17 NOTE — [facility] Suicide Risk Assessment (Signed)
   Demographic Factors:  Adolescent or young adult, Low socioeconomic status, Living alone and Unemployed  Total Time spent with patient: 30 minutes  Psychiatric Specialty Exam: Physical Exam  ROS  Blood pressure 121/81, pulse 83, temperature 98.1 F (36.7 C), temperature source Oral, resp. rate 20, height 5\' 7"  (1.702 m), weight 125.193 kg (276 lb), SpO2 100.00%.Body mass index is 43.22 kg/(m^2).  General Appearance: Casual  Eye Contact::  Good  Speech:  Clear and Coherent  Volume:  Normal  Mood:  Anxious  Affect:  Appropriate and Congruent  Thought Process:  Coherent and Goal Directed  Orientation:  Full (Time, Place, and Person)  Thought Content:  WDL  Suicidal Thoughts:  No  Homicidal Thoughts:  No  Memory:  Immediate;   Good Recent;   Good  Judgement:  Good  Insight:  Good  Psychomotor Activity:  Normal  Concentration:  Good  Recall:  Good  Fund of Knowledge:Good  Language: Good  Akathisia:  NA  Handed:  Right  AIMS (if indicated):     Assets:  Communication Skills Desire for Improvement Financial Resources/Insurance Housing Leisure Time Physical Health Resilience Social Support Talents/Skills  Sleep:  Number of Hours: 6    Musculoskeletal: Strength & Muscle Tone: within normal limits Gait & Station: broad based Patient leans: N/A   Mental Status Per Nursing Assessment::   On Admission:  Suicidal ideation indicated by patient;Suicide plan;Self-harm thoughts;Belief that plan would result in death  Current Mental Status by Physician: NA  Loss Factors: Decline in physical health and Financial problems/change in socioeconomic status  Historical Factors: Family history of mental illness or substance abuse and NA  Risk Reduction Factors:   Sense of responsibility to family, Religious beliefs about death, Living with another person, especially a relative, Positive social support, Positive therapeutic relationship and Positive coping skills or problem  solving skills  Continued Clinical Symptoms:  Bipolar Disorder:   Depressive phase Depression:   Recent sense of peace/wellbeing Chronic Pain Unstable or Poor Therapeutic Relationship Previous Psychiatric Diagnoses and Treatments Medical Diagnoses and Treatments/Surgeries  Cognitive Features That Contribute To Risk:  Polarized thinking    Suicide Risk:  Minimal: No identifiable suicidal ideation.  Patients presenting with no risk factors but with morbid ruminations; may be classified as minimal risk based on the severity of the depressive symptoms  Discharge Diagnoses:   AXIS I:  Bipolar, Depressed AXIS II:  Deferred AXIS III:   Past Medical History  Diagnosis Date  . PTSD (post-traumatic stress disorder)   . Anxiety   . Fibromyalgia   . Bipolar disorder   . Arthritis   . Hypertension   . Thyroid disease   . Carpal tunnel syndrome, bilateral   . Asthma   . History of chicken pox   . Depression   . Hyperlipidemia   . Incontinence of urine in female   . Arachnoiditis    AXIS IV:  other psychosocial or environmental problems, problems related to social environment and problems with primary support group AXIS V:  61-70 mild symptoms  Plan Of Care/Follow-up recommendations:  Activity:  As tolerated Diet:  Regular  Is patient on multiple antipsychotic therapies at discharge:  No   Has Patient had three or more failed trials of antipsychotic monotherapy by history:  No  Recommended Plan for Multiple Antipsychotic Therapies: NA    Janardhaha R Shekela Goodridge 06/17/2013, 12:49 PM

## 2013-06-17 NOTE — Progress Notes (Signed)
Adult Psychoeducational Group Note  Date:  06/17/2013 Time:  9:45 PM  Group Topic/Focus:  Goals Group:   The focus of this group is to help patients establish daily goals to achieve during treatment and discuss how the patient can incorporate goal setting into their daily lives to aide in recovery.  Participation Level:  Active  Participation Quality:  Appropriate  Affect:  Appropriate  Cognitive:  Appropriate  Insight: Appropriate  Engagement in Group:  Engaged  Modes of Intervention:  Discussion  Additional Comments:  Pt stress filled day, Pt stated that she was not ready to be released and that upset her. Pt then stated that her day got better after the visit of her children and grandchildren.   Olena Leatherwood 06/17/2013, 9:45 PM

## 2013-06-17 NOTE — Treatment Plan (Signed)
Pt verbalized intermittent SI.  Reports she does not feel ready to be discharged.  She understands her insurance won't pay for any more days, but states she'll pay out of pocket because she is "worth it."  Consulted with Dr. Louretta Shorten and he was agreeable with her staying another night as long as she understood that meant that she would likely be receiving a bill.  Pt verbalized understanding.  She is thankful for the opportunity to "take in more groups and get her outpt appointments set-up."

## 2013-06-17 NOTE — Discharge Summary (Signed)
Physician Discharge Summary Note  Patient:  Caitlyn Bautista is an 63 y.o., female MRN:  [id number] DOB:  1950/05/01 Patient phone:  [phone] (home)  Patient address:   [place] [address],  Total Time spent with patient: 15 minutes  Date of Admission:  06/13/2013 Date of Discharge: 06/17/2013  Reason for Admission:  Depression with suicidal ideation  Discharge Diagnoses: Active Problems:   Bipolar 1 disorder, depressed   Psychiatric Specialty Exam:   Please see D/C SRA Physical Exam  ROS  Blood pressure 121/81, pulse 83, temperature 98.1 F (36.7 C), temperature source Oral, resp. rate 20, height 5\' 7"  (1.702 m), weight 125.193 kg (276 lb), SpO2 100.00%.Body mass index is 43.22 kg/(m^2).  General Appearance:   Eye Contact::    Speech:    Volume:    Mood:    Affect:    Thought Process:    Orientation:    Thought Content:    Suicidal Thoughts:    Homicidal Thoughts:    Memory:    Judgement:    Insight:    Psychomotor Activity:    Concentration:    Recall:    Fund of Knowledge:  Language:   Akathisia:    Handed:    AIMS (if indicated):     Assets:    Sleep:  Number of Hours: 6    Past Psychiatric History: Past Psychiatric History:  Diagnosis: Bipolar disorder most recent episode mixed   Hospitalizations: [facility] 2013   Outpatient Care: Dr. Darleene Cleaver   Substance Abuse Care: na   Self-Mutilation:   Suicidal Attempts: Cut wrist at 12   Violent Behaviors: denies    Musculoskeletal: Strength & Muscle Tone:  Gait & Station:  Patient leans:   DSM5:  Discharge Diagnoses:  AXIS I: Bipolar, Depressed  AXIS II: Deferred  AXIS III:  Past Medical History   Diagnosis  Date   .  PTSD (post-traumatic stress disorder)    .  Anxiety    .  Fibromyalgia    .  Bipolar disorder    .  Arthritis    .  Hypertension    .  Thyroid disease    .  Carpal tunnel syndrome, bilateral    .  Asthma    .  History of chicken pox    .  Depression    .  Hyperlipidemia     .  Incontinence of urine in female    .  Arachnoiditis     AXIS IV: other psychosocial or environmental problems, problems related to social environment and problems with primary support group  AXIS V: 61-70 mild symptoms Level of Care:  OP  Hospital Course:  Caitlyn Bautista is a 63 year old AAF with multiple medical problems who was admitted voluntarily from the [facility] where she presented reporting worsening symptoms of depression with a plan to kill herself using rat poison.        Upon arrival at the Adult unit the patient was evaluated by a psychiatrist and her symptoms of depression were noted. She had been off of her medication for several days due to finances and this seemed to be a significant issue for her. Caitlyn Bautista was also reporting chronic pain from her "arachnoiditis."  Her health problems were identified and she was treated appropriately.       Medication management was started with her home meds including Cymbalta 60mg  po BID, Lithium Carbonate 600mg  po BID with meals, Valium 5mg  po TID for muscle spasms, Welbutrin XL 150mg   po each AM.  On admission her Lithium level was below the therapeutic level.      Caitlyn Bautista was encouraged to participate in unit programming and participated in 100% of the groups available to her during her admission. She appeared motivated to recover and worked appropriately in the groups.  Caitlyn Bautista was appropriate in her behaviors and did not require seclusion or restraint. She was kind and respectful of her peers and supportive of their needs as well.      By the day of discharge Caitlyn Bautista was in much improved condition and felt well enough to return home. She denied SI/HI/AVH. She was motivated to continue her medication as noted and planned to follow up as noted below.  She felt that the coping skills she had learned at [facility] would help her significantly and she looked forward to putting them into practice.        Consults:  None  Significant Diagnostic Studies:  None  Discharge  Vitals:   Blood pressure 121/81, pulse 83, temperature 98.1 F (36.7 C), temperature source Oral, resp. rate 20, height 5\' 7"  (1.702 m), weight 125.193 kg (276 lb), SpO2 100.00%. Body mass index is 43.22 kg/(m^2). Lab Results:   Results for orders placed during the hospital encounter of 06/13/13 (from the past 72 hour(s))  LITHIUM LEVEL     Status: Abnormal   Collection Time    06/16/13  6:38 AM      Result Value Ref Range   Lithium Lvl 0.54 (*) 0.80 - 1.40 mEq/L   Comment: Performed at [facility]    Physical Findings: AIMS: Facial and Oral Movements Muscles of Facial Expression: None, normal Lips and Perioral Area: None, normal Jaw: None, normal Tongue: None, normal,Extremity Movements Upper (arms, wrists, hands, fingers): None, normal Lower (legs, knees, ankles, toes): None, normal, Trunk Movements Neck, shoulders, hips: None, normal, Overall Severity Severity of abnormal movements (highest score from questions above): None, normal Incapacitation due to abnormal movements: None, normal Patient's awareness of abnormal movements (rate only patient's report): No Awareness, Dental Status Current problems with teeth and/or dentures?: No Does patient usually wear dentures?: No  CIWA:  CIWA-Ar Total: 1 COWS:  COWS Total Score: 2  Psychiatric Specialty Exam: See Psychiatric Specialty Exam and Suicide Risk Assessment completed by Attending Physician prior to discharge.  Discharge destination:  Home  Is patient on multiple antipsychotic therapies at discharge:  No   Has Patient had three or more failed trials of antipsychotic monotherapy by history:  No  Recommended Plan for Multiple Antipsychotic Therapies: NA  Discharge Orders   Future Orders Complete By Expires   Diet - low sodium heart healthy  As directed    Discharge instructions  As directed    Increase activity slowly  As directed        Medication List       Indication   albuterol 108 (90  BASE) MCG/ACT inhaler  Commonly known as:  PROVENTIL HFA;VENTOLIN HFA  Inhale 2 puffs into the lungs daily. For wheezing.   Indication:  Asthma     amLODipine 10 MG tablet  Commonly known as:  NORVASC  Take 1 tablet (10 mg total) by mouth daily. For hypertension.   Indication:  High Blood Pressure     buPROPion 150 MG 24 hr tablet  Commonly known as:  WELLBUTRIN XL  Take 1 tablet (150 mg total) by mouth daily.   Indication:  Major Depressive Disorder     cholecalciferol 1000 UNITS tablet  Commonly known as:  VITAMIN D  Take 1 tablet (1,000 Units total) by mouth daily.   Indication:  Vitamin D Deficiency     diazepam 5 MG tablet  Commonly known as:  VALIUM  Take 1 tablet (5 mg total) by mouth 3 (three) times daily as needed for anxiety.   Indication:  Feeling Anxious     DULoxetine 60 MG capsule  Commonly known as:  CYMBALTA  Take 1 capsule (60 mg total) by mouth 2 (two) times daily.   Indication:  Fibromyalgia Syndrome, Major Depressive Disorder     fexofenadine 180 MG tablet  Commonly known as:  ALLEGRA  Take 1 tablet (180 mg total) by mouth daily. For seasonal allergies.   Indication:  Hayfever     fluticasone 50 MCG/ACT nasal spray  Commonly known as:  FLONASE  Place 2 sprays into the nose daily. For seasonal allergies.      lithium 600 MG capsule  Take 1 capsule (600 mg total) by mouth 2 (two) times daily with a meal.   Indication:  Manic-Depression     ranitidine 75 MG tablet  Commonly known as:  ZANTAC  Take 1 tablet (75 mg total) by mouth 2 (two) times daily.   Indication:  Gastroesophageal Reflux Disease     traMADol 50 MG tablet  Commonly known as:  ULTRAM  Take 1 tablet (50 mg total) by mouth every 8 (eight) hours as needed for pain.            Follow-up Information   [facility] up with [facility] On 06/20/2013. (Appointment scheduled at 3:00 pm on this date with Lexine Baton for therapy.)    Contact information:   [address] [place], [place] [address] Phone: [phone] Fax: [phone]      Follow up with [facility] On 06/25/2013. (Appointment scheduled at 2:50 pm on this date with Dr. Darleene Cleaver for medication management)    Contact information:   [address], [place], [place] [address] Phone: [phone] Fax: [phone]      Follow-up recommendations:   Activities: Resume activity as tolerated. Diet: Heart healthy low sodium diet Tests: Follow up testing will be determined by your out patient provider.  Comments:  Follow up appointment with your PCP is 06/21/2013 at 2:30 pm. With [facility].  Total Discharge Time:  Less than 30 minutes.  SignedNena Polio 06/17/2013, 11:04 AM  Patient was seen face-to-face for psychiatric evaluation, suicide risk assessment, case discussed with treatment team including physician extender and made appropriate disposition plan.Reviewed the information documented and agree with the treatment plan.  Parke Simmers Vencent Hauschild 06/18/2013 12:48 PM

## 2013-06-17 NOTE — Tx Team (Signed)
Interdisciplinary Treatment Plan Update   Date Reviewed:  06/17/2013  Time Reviewed:  9:45 AM  Progress in Treatment:   Attending groups: Yes Participating in groups: Yes Taking medication as prescribed: Yes  Tolerating medication: Yes Family/Significant other contact made:  Yes, collateral contact made with family Patient understands diagnosis: Yes  Discussing patient identified problems/goals with staff: Yes Medical problems stabilized or resolved: Yes Denies suicidal/homicidal ideation: Yes Patient has not harmed self or others: Yes  For review of initial/current patient goals, please see plan of care.  Estimated Length of Stay:  1 Day  Reasons for Continued Hospitalization:   New Problems/Goals identified:    Discharge Plan or Barriers:   Home with outpatient [facility]  Additional Comments:  Attendees:  Patient:  Caitlyn Bautista 06/17/2013 9:45 AM   Signature: Mylinda Latina, MD 06/17/2013 9:45 AM  Signature:  Nena Polio, PA 06/17/2013 9:45 AM  Signature:  Thurnell Garbe, RN 06/17/2013 9:45 AM  Signature:  Grayland Ormond, RN 06/17/2013 9:45 AM  Signature:   Drake Leach,  RN 06/17/2013 9:45 AM  Signature:  Joette Catching, LCSW 06/17/2013 9:45 AM  Signature:   06/17/2013 9:45 AM  Signature:  Lucinda Dell, Care Coordinator [facility] 06/17/2013 9:45 AM  Signature:   06/17/2013 9:45 AM  Signature:  06/17/2013  9:45 AM  Signature:   Lars Pinks, RN [facility] 06/17/2013  9:45 AM  Signature:   06/17/2013  9:45 AM    Scribe for Treatment Team:   Joette Catching,  06/17/2013 9:45 AM

## 2013-06-18 MED ORDER — LITHIUM CARBONATE 300 MG PO CAPS
600.0000 mg | ORAL_CAPSULE | Freq: Every day | ORAL | Status: DC
  Administered 2013-06-19 – 2013-06-20 (×2): 600 mg via ORAL
  Filled 2013-06-18: qty 15
  Filled 2013-06-18 (×4): qty 2

## 2013-06-18 MED ORDER — LITHIUM CARBONATE 300 MG PO CAPS
900.0000 mg | ORAL_CAPSULE | Freq: Every day | ORAL | Status: DC
  Administered 2013-06-18 – 2013-06-19 (×2): 900 mg via ORAL
  Filled 2013-06-18 (×4): qty 3

## 2013-06-18 NOTE — Progress Notes (Signed)
D:  Patient's self inventory sheet, patient sleeps well, good appetite, low energy level, improving attention span.  Rated depression 7, hopeless 8.  Denied withdrawals.  SI off/on, contracts for safety.   Patient has experienced pain, headache in past 24 hours, very nervous and anxious.  Pain goal 5, worst pain #7.  Will try to do the best she can, whatever going to be is going to be!  No point to talk to staff!  Did not want to answer questions on discharge or medications after discharge. A:  Medications administered per MD orders.  Emotional support and encouragement given patient. R:  Denied SI and HI.  Denied A/V hallucinations.  Will continue to monitor patient for safety with 15 minute checks.  Safety maintained.

## 2013-06-18 NOTE — Progress Notes (Signed)
The focus of this group is to educate the patient on the purpose and policies of crisis stabilization and provide a format to answer questions about their admission.  The group details unit policies and expectations of patients while admitted. Patient attended group and was an active participant.  She did exercisses from her chair as she was able.  She was an active listener.

## 2013-06-18 NOTE — Progress Notes (Signed)
The focus of this group is to educate the patient on the purpose and policies of crisis stabilization and provide a format to answer questions about their admission.  The group details unit policies and expectations of patients while admitted.  Patient attended 0900 nurse education orientation group this morning.  Patient actively participated, appropriate affect, alert, appropriate insight and engagement.  Today patient will work on 3 goals for discharge.  

## 2013-06-18 NOTE — Progress Notes (Signed)
Adult Psychoeducational Group Note  Date:  06/18/2013 Time:  11:40 PM  Group Topic/Focus:  Wrap-Up Group:   The focus of this group is to help patients review their daily goal of treatment and discuss progress on daily workbooks.  Participation Level:  Active  Participation Quality:  Appropriate  Affect:  Appropriate  Cognitive:  Appropriate  Insight: Appropriate  Engagement in Group:  Engaged  Modes of Intervention:  Discussion  Additional Comments:  Pt was present for wrap up group. She stated that she learned when you believe in something you should work for it. I related this to her own mental health. She said that she also learned today that kindness can come from anywhere, because one of her peers was exceptionally nice to her today. She said that she still does not feel ready to go home, but she is feeling more positive than she was yesterday. She spent most of the evening conversing with other patients in the dayroom.   Shellye Zandi A Marveline Profeta 06/18/2013, 11:40 PM

## 2013-06-18 NOTE — Progress Notes (Signed)
Patient ID: Caitlyn Bautista, female   DOB: November 17, 1950, 63 y.o.   MRN: [id number] [facility] MD Progress Note  06/18/2013 3:40 PM Adreanna Fickel  MRN:  [id number]  Subjective:  Met with patient today to discuss her decision to discharge yesterday. She stated that late in the afternoon, that she just didn't feel ready and that if she had had the money she was expecting she would have bought the rat poison.  Today she is still suicidal, but can contract for safety. She rates her depression as a 7/10,  And her anxiety is a 6-7/10.    Diagnosis:   DSM5: Schizophrenia Disorders:   Obsessive-Compulsive Disorders:   Trauma-Stressor Disorders:   Substance/Addictive Disorders:   Depressive Disorders:  Bipolar disorder most recent episode depressed. Total Time spent with patient:  ADL's:  Intact  Sleep: Good  Appetite:  Good  Suicidal Ideation:  Yes with intent and plan Homicidal Ideation:  denies AEB (as evidenced by):  Psychiatric Specialty Exam: Physical Exam  Constitutional: She is oriented to person, place, and time. She appears well-developed.  HENT:  Head: Normocephalic.  Neck: Normal range of motion.  Respiratory: Effort normal.  GI: Soft.  Musculoskeletal:  Gait is abnormal, is using a cane for mobility.   Neurological: She is alert and oriented to person, place, and time.  Skin: Skin is warm and dry.    Review of Systems  Psychiatric/Behavioral: Positive for depression, suicidal ideas and hallucinations. Negative for substance abuse. The patient is nervous/anxious. The patient does not have insomnia.     Blood pressure 125/82, pulse 116, temperature 98.3 F (36.8 C), temperature source Oral, resp. rate 20, height _0  (1.702 m), weight 125.193 kg (276 lb), SpO2 100.00%.Body mass index is 43.22 kg/(m^2).  General Appearance: Fairly Groomed   Engineer, water::  Good   Speech:  Clear and Coherent   Volume:  Normal   Mood:  Depressed   Affect:  Congruent   Thought Process:  Goal Directed   Orientation:  Full (Time, Place, and Person)  Thought Content:  WDL  Suicidal Thoughts:  Yes.  without intent/plan   Homicidal Thoughts:  No   Memory:  NA  Judgement:  Fair  Insight:  Shallow  Psychomotor Activity:  Normal  Concentration:  Fair  Recall:  [place] of Knowledge:Fair  Language: Good  Akathisia:  No  Handed:  Left  AIMS (if indicated):     Assets:  Communication Skills Desire for Improvement Resilience  Sleep:  Number of Hours: 6   Musculoskeletal: Strength & Muscle Tone: within normal limits Gait & Station: shuffle w/cane  Patient leans: N/A  Current Medications: Current Facility-Administered Medications  Medication Dose Route Frequency Provider Last Rate Last Dose  . acetaminophen (TYLENOL) tablet 650 mg  650 mg Oral Q6H PRN Nena Polio, PA-C      . albuterol (PROVENTIL HFA;VENTOLIN HFA) 108 (90 BASE) MCG/ACT inhaler 2 puff  2 puff Inhalation Daily Nena Polio, PA-C   2 puff at 06/18/13 0902  . alum & mag hydroxide-simeth (MAALOX/MYLANTA) 200-200-20 MG/5ML suspension 30 mL  30 mL Oral Q4H PRN Nena Polio, PA-C   30 mL at 06/16/13 2259  . amLODipine (NORVASC) tablet 10 mg  10 mg Oral Daily Nena Polio, PA-C   10 mg at 06/18/13 1638  . buPROPion (WELLBUTRIN XL) 24 hr tablet 150 mg  150 mg Oral Daily Nena Polio, PA-C   150 mg at 06/18/13 4665  . cholecalciferol (VITAMIN D) tablet 1,000 Units  1,000 Units  Oral Daily Nena Polio, PA-C   1,000 Units at 06/18/13 8768  . diazepam (VALIUM) tablet 5 mg  5 mg Oral TID PRN Benjamine Mola, FNP   5 mg at 06/18/13 1157  . doxepin (SINEQUAN) capsule 25 mg  25 mg Oral QHS,MR X 1 Benjamine Mola, FNP   25 mg at 06/17/13 2253  . DULoxetine (CYMBALTA) DR capsule 60 mg  60 mg Oral BID Nena Polio, PA-C   60 mg at 06/18/13 2620  . famotidine (PEPCID) tablet 10 mg  10 mg Oral Daily Nena Polio, PA-C   10 mg at 06/18/13 3559  . hydrOXYzine (ATARAX/VISTARIL) tablet 25 mg  25 mg Oral Q6H PRN Benjamine Mola, FNP      .  lithium carbonate capsule 600 mg  600 mg Oral BID WC Nena Polio, PA-C   600 mg at 06/18/13 0904  . loratadine (CLARITIN) tablet 10 mg  10 mg Oral Daily Nena Polio, PA-C   10 mg at 06/18/13 0905  . magnesium hydroxide (MILK OF MAGNESIA) suspension 30 mL  30 mL Oral Daily PRN Nena Polio, PA-C      . pneumococcal 23 valent vaccine (PNU-IMMUNE) injection 0.5 mL  0.5 mL Intramuscular Tomorrow-1000 Durward Parcel, MD      . traMADol Veatrice Bourbon) tablet 50 mg  50 mg Oral Q6H PRN Nena Polio, PA-C   50 mg at 06/18/13 7416    Lab Results:  No results found for this or any previous visit (from the past 48 hour(s)).  Physical Findings: AIMS: Facial and Oral Movements Muscles of Facial Expression: None, normal Lips and Perioral Area: None, normal Jaw: None, normal Tongue: None, normal,Extremity Movements Upper (arms, wrists, hands, fingers): None, normal Lower (legs, knees, ankles, toes): None, normal, Trunk Movements Neck, shoulders, hips: None, normal, Overall Severity Severity of abnormal movements (highest score from questions above): None, normal Incapacitation due to abnormal movements: None, normal Patient's awareness of abnormal movements (rate only patient's report): No Awareness, Dental Status Current problems with teeth and/or dentures?: No Does patient usually wear dentures?: No  CIWA:  CIWA-Ar Total: 1 COWS:  COWS Total Score: 2  Treatment Plan Summary: Daily contact with patient to assess and evaluate symptoms and progress in treatment Medication management  Plan: 1. Continue crisis management and stabilization. 2. Medication management to reduce current symptoms to base line and improve the patient's overall level of functioning. 3. Continue Doxepin 59m qhs PRN insomnia/racing thoughts 4. Increase lithium to 600 AM and 900 mg PM. 5. Lithium level on Thurs. AM 6. Disposition plans are in progress  Medical Decision Making Problem Points:  Established  problem, stable/improving (1) and Review of psycho-social stressors (1) Data Points:  Review or order medicine tests (1)  I certify that inpatient services furnished can reasonably be expected to improve the patient's condition.   NMarlane Hatcher Mashburn RPAC 3:52 PM 06/18/2013  Reviewed the information documented and agree with the treatment plan.  JParke SimmersJonnalagadda 06/19/2013 11:46 AM

## 2013-06-18 NOTE — [facility] Group Notes (Signed)
[facility] LCSW Group Therapy  06/18/2013   1:15 PM   Type of Therapy:  Group Therapy  Participation Level:  Active  Participation Quality:  Attentive, Sharing and Supportive  Affect:  Depressed and Flat  Cognitive:  Alert and Oriented  Insight:  Developing/Improving and Engaged  Engagement in Therapy:  Developing/Improving and Engaged  Modes of Intervention:  Activity, Clarification, Confrontation, Discussion, Education, Exploration, Limit-setting, Orientation, Problem-solving, Rapport Building, Reality Testing, Socialization and Support  Summary of Progress/Problems: Patient was attentive and engaged with speaker from [employer].  Patient was attentive to speaker while they shared their story of dealing with mental health and overcoming it.  Patient expressed interest in their programs and services and received information on their agency.  Patient processed ways they can relate to the speaker.     Avey Mcmanamon Horton, LCSW 06/18/2013  1:55 PM       

## 2013-06-18 NOTE — Progress Notes (Signed)
Recreation Therapy Notes  Animal-Assisted Activity/Therapy (AAA/T) Program Checklist/Progress Notes Patient Eligibility Criteria Checklist & Daily Group note for Rec Tx Intervention  Date: 04.14.2015 Time: 2:45am Location: [address]    AAA/T Program Assumption of Risk Form signed by Patient/ or Parent Legal Guardian yes  Patient is free of allergies or sever asthma yes  Patient reports no fear of animals yes  Patient reports no history of cruelty to animals yes   Patient understands his/her participation is voluntary yes  Patient washes hands before animal contact yes  Patient washes hands after animal contact yes  Behavioral Response: Engaged, Appropriate   Education: Hand Washing, Appropriate Animal Interaction   Education Outcome: Acknowledges understanding  Clinical Observations/Feedback: Patient interacted appropriately with therapy dog team.    Lane Hacker, LRT/CTRS  Laureen Ochs Jeweliana Dudgeon 06/18/2013 4:49 PM

## 2013-06-19 NOTE — Progress Notes (Signed)
Patient ID: Caitlyn Bautista, female   DOB: 08/09/1950, 63 y.o.   MRN: [id number] Patient ID: Caitlyn Bautista, female   DOB: 1950/07/20, 63 y.o.   MRN: [id number] [facility] MD Progress Note  06/19/2013 2:32 PM Caitlyn Bautista  MRN:  [id number]  Subjective:  Patient has been uncomfortable with her depression and stated that she can recognize much better her manic symptoms. She has been compliant with her medications and has no adverse effects. She has been attending her groups and learning coping skills. She has been ruminating about her past and says she needs to be in counseling. She rates depression as a 4-5/10, and her anxiety is a 3/10 and has no suicidal thoughts and contract for safety but says they are on and off. She has been in communication with her daughter and several other family who visited her.      Diagnosis:   DSM5: Schizophrenia Disorders:   Obsessive-Compulsive Disorders:   Trauma-Stressor Disorders:   Substance/Addictive Disorders:   Depressive Disorders:  Bipolar disorder most recent episode depressed. Total Time spent with patient:  ADL's:  Intact  Sleep: Good  Appetite:  Good  Suicidal Ideation:  Yes with intent and plan Homicidal Ideation:  denies AEB (as evidenced by):  Psychiatric Specialty Exam: Physical Exam  Constitutional: She is oriented to person, place, and time. She appears well-developed.  HENT:  Head: Normocephalic.  Neck: Normal range of motion.  Respiratory: Effort normal.  GI: Soft.  Musculoskeletal:  Gait is abnormal, is using a cane for mobility.   Neurological: She is alert and oriented to person, place, and time.  Skin: Skin is warm and dry.    Review of Systems  Psychiatric/Behavioral: Positive for depression, suicidal ideas and hallucinations. Negative for substance abuse. The patient is nervous/anxious. The patient does not have insomnia.     Blood pressure 146/92, pulse 91, temperature 99.6 F (37.6 C), temperature source Oral, resp. rate 20,  height 5\' 7"  (1.702 m), weight 125.193 kg (276 lb), SpO2 100.00%.Body mass index is 43.22 kg/(m^2).  General Appearance: Fairly Groomed   Engineer, water::  Good   Speech:  Clear and Coherent   Volume:  Normal   Mood:  Depressed   Affect:  Congruent   Thought Process:  Goal Directed  Orientation:  Full (Time, Place, and Person)  Thought Content:  WDL  Suicidal Thoughts:  Yes.  without intent/plan   Homicidal Thoughts:  No   Memory:  NA  Judgement:  Fair  Insight:  Shallow  Psychomotor Activity:  Normal  Concentration:  Fair  Recall:  [place] of Knowledge:Fair  Language: Good  Akathisia:  No  Handed:  Left  AIMS (if indicated):     Assets:  Communication Skills Desire for Improvement Resilience  Sleep:  Number of Hours: 6   Musculoskeletal: Strength & Muscle Tone: within normal limits Gait & Station: shuffle w/cane  Patient leans: N/A  Current Medications: Current Facility-Administered Medications  Medication Dose Route Frequency Provider Last Rate Last Dose  . acetaminophen (TYLENOL) tablet 650 mg  650 mg Oral Q6H PRN Nena Polio, PA-C      . albuterol (PROVENTIL HFA;VENTOLIN HFA) 108 (90 BASE) MCG/ACT inhaler 2 puff  2 puff Inhalation Daily Nena Polio, PA-C   2 puff at 06/19/13 0753  . alum & mag hydroxide-simeth (MAALOX/MYLANTA) 200-200-20 MG/5ML suspension 30 mL  30 mL Oral Q4H PRN Nena Polio, PA-C   30 mL at 06/16/13 2259  . amLODipine (NORVASC) tablet 10 mg  10 mg  Oral Daily Nena Polio, PA-C   10 mg at 06/19/13 0755  . buPROPion (WELLBUTRIN XL) 24 hr tablet 150 mg  150 mg Oral Daily Nena Polio, PA-C   150 mg at 06/19/13 0755  . cholecalciferol (VITAMIN D) tablet 1,000 Units  1,000 Units Oral Daily Nena Polio, PA-C   1,000 Units at 06/19/13 0757  . diazepam (VALIUM) tablet 5 mg  5 mg Oral TID PRN Benjamine Mola, FNP   5 mg at 06/18/13 1610  . doxepin (SINEQUAN) capsule 25 mg  25 mg Oral QHS,MR X 1 Benjamine Mola, FNP   25 mg at 06/18/13 2302  .  DULoxetine (CYMBALTA) DR capsule 60 mg  60 mg Oral BID Nena Polio, PA-C   60 mg at 06/19/13 0756  . famotidine (PEPCID) tablet 10 mg  10 mg Oral Daily Nena Polio, PA-C   10 mg at 06/19/13 0757  . hydrOXYzine (ATARAX/VISTARIL) tablet 25 mg  25 mg Oral Q6H PRN Benjamine Mola, FNP      . lithium carbonate capsule 600 mg  600 mg Oral Q breakfast Nena Polio, PA-C   600 mg at 06/19/13 0802  . lithium carbonate capsule 900 mg  900 mg Oral QAC supper Nena Polio, PA-C   900 mg at 06/18/13 1716  . loratadine (CLARITIN) tablet 10 mg  10 mg Oral Daily Nena Polio, PA-C   10 mg at 06/19/13 0758  . magnesium hydroxide (MILK OF MAGNESIA) suspension 30 mL  30 mL Oral Daily PRN Nena Polio, PA-C      . pneumococcal 23 valent vaccine (PNU-IMMUNE) injection 0.5 mL  0.5 mL Intramuscular Tomorrow-1000 Durward Parcel, MD      . traMADol Veatrice Bourbon) tablet 50 mg  50 mg Oral Q6H PRN Nena Polio, PA-C   50 mg at 06/18/13 9604    Lab Results:  No results found for this or any previous visit (from the past 48 hour(s)).  Physical Findings: AIMS: Facial and Oral Movements Muscles of Facial Expression: None, normal Lips and Perioral Area: None, normal Jaw: None, normal Tongue: None, normal,Extremity Movements Upper (arms, wrists, hands, fingers): None, normal Lower (legs, knees, ankles, toes): None, normal, Trunk Movements Neck, shoulders, hips: None, normal, Overall Severity Severity of abnormal movements (highest score from questions above): None, normal Incapacitation due to abnormal movements: None, normal Patient's awareness of abnormal movements (rate only patient's report): No Awareness, Dental Status Current problems with teeth and/or dentures?: No Does patient usually wear dentures?: No  CIWA:  CIWA-Ar Total: 1 COWS:  COWS Total Score: 2  Treatment Plan Summary: Daily contact with patient to assess and evaluate symptoms and progress in treatment Medication  management  Plan: 1. Continue crisis management and stabilization. 2. Medication management to reduce current symptoms to base line and improve the patient's overall level of functioning. No medication changes made today and will monitor for lithium level as planned 3. Continue Doxepin 25mg  qhs PRN insomnia/racing thoughts 4. Continue lithium to 600 AM and 900 mg PM. 5. Lithium level on Thurs. AM 6. Disposition plans are in progress and may be discharged tomorrow if continue to show clinical improvement and contract for safety.  Medical Decision Making Problem Points:  Established problem, stable/improving (1) and Review of psycho-social stressors (1) Data Points:  Review or order medicine tests (1)  I certify that inpatient services furnished can reasonably be expected to improve the patient's condition.    Parke Simmers Landi Biscardi 06/19/2013 2:32 PM

## 2013-06-19 NOTE — Progress Notes (Signed)
Adult Psychoeducational Group Note  Date:  06/19/2013 Time:  9:47 PM  Group Topic/Focus:  Wrap-Up Group:   The focus of this group is to help patients review their daily goal of treatment and discuss progress on daily workbooks.  Participation Level:  Active  Participation Quality:  Appropriate  Affect:  Appropriate  Cognitive:  Alert  Insight: Appropriate  Engagement in Group:  Engaged  Modes of Intervention:  Discussion  Additional Comments:  Pt stated that she is glad that she was able to stay here a couple extra days because her insurance company were not going to pay for any additional days. She also stated that she has learned some valuable lessons while here and she is thankful for the people who have helped her along the way. She now understands that her depression will not go away, but that she must learn how to cope with it.  Sharmon Revere 06/19/2013, 9:47 PM

## 2013-06-19 NOTE — BHH Group Notes (Signed)
[facility] LCSW Aftercare Discharge Planning Group Note   06/19/2013 8:45 AM  Participation Quality:  Alert, Appropriate and Oriented  Mood/Affect:  Flat and Depressed  Depression Rating:  5  Anxiety Rating:  Pt couldn't rate  Thoughts of Suicide:  Pt denies SI/HI  Will you contract for safety?   Yes  Current AVH:  Pt denies  Plan for Discharge/Comments:  Pt attended discharge planning group and actively participated in group.  CSW provided pt with today's workbook.  Pt reports feeling different today, stating that she is not having the thoughts of hurting herself but not 100% better either.  Pt also has complaints of medical issues.  Pt can return home in [place] and has follow up scheduled at [place] for [facility] for outpatient medication management and therapy.  No further needs voiced by pt at this time.    Transportation Means: Pt reports access to transportation - family will pick pt up  Supports: No supports mentioned at this time  Regan Lemming, [place] 06/19/2013 9:45 AM

## 2013-06-19 NOTE — BHH Group Notes (Signed)
[facility] LCSW Group Therapy  06/19/2013  1:15 PM   Type of Therapy:  Group Therapy  Participation Level:  Active  Participation Quality:  Attentive, Sharing and Supportive  Affect:  Depressed and Flat  Cognitive:  Alert and Oriented  Insight:  Developing/Improving and Engaged  Engagement in Therapy:  Developing/Improving and Engaged  Modes of Intervention:  Clarification, Confrontation, Discussion, Education, Exploration, Limit-setting, Orientation, Problem-solving, Rapport Building, Art therapist, Socialization and Support  Summary of Progress/Problems: The topic for group today was emotional regulation.  This group focused on both positive and negative emotion identification and allowed group members to process ways to identify feelings, regulate negative emotions, and find healthy ways to manage internal/external emotions. Group members were asked to reflect on a time when their reaction to an emotion led to a negative outcome and explored how alternative responses using emotion regulation would have benefited them. Group members were also asked to discuss a time when emotion regulation was utilized when a negative emotion was experienced.  Pt shared that she used to struggle with anger but believes she's turned it inwards which resulted in her depression.  Pt discussed the many impacts her anger, depression and mania have had on her life.  Pt states that she now realizes today that her mental health is going to be an ongoing battle and not something that will go away.  Pt appeared very content with this realization.  CSW also discussed accepting her content mood, as pt is unable to put a name on her emotion now because she is only used to depressed or manic mood.  Pt actively participated and was engaged in group discussion.    Regan Lemming, LCSW 06/19/2013  2:53 PM

## 2013-06-19 NOTE — Progress Notes (Signed)
D:  Patient's self inventory sheet, patient sleeps well, good appetite, low energy level, improving attention span.  Rated depression and hopeless 4, anxiety but could not rate.  Denied withdrawals.  SI off/on, contracts for safety on form.  Has experienced pain in feet, ankle, and lower leg swelling.  Worst pain #4, pain goal 3-4.  Possible discharge plans.  Needs financial assistance for medications. A:   Medications administered per MD orders.  Emotional support and encouragement given patient. R:  Denied SI with nurse.  Contracts for safety.  Denied HI.  Denied A/V hallucinations.  Will continue to monitor patient for safety with 15 minute checks.  Safety maintained. Ambulates with cane in hallway.

## 2013-06-19 NOTE — Progress Notes (Signed)
Pt reports she is a little better, but still does not feel ready for discharge.  She is aware that she will be responsible for her bill after Monday.  Pt reports passive SI.  No HI/AV.  Pt has been observed in the dayroom talking with peers.  Pt has been observed smiling more.  Pt plans to return home at discharge.  Pt makes her needs known to staff.  Support and encouragement offered.  Safety maintained with q15 minute checks.

## 2013-06-19 NOTE — Tx Team (Signed)
Interdisciplinary Treatment Plan Update (Adult)  Date: 06/19/2013  Time Reviewed:  9:45 AM  Progress in Treatment: Attending groups: Yes Participating in groups:  Yes Taking medication as prescribed:  Yes Tolerating medication:  Yes Family/Significant othe contact made: Yes, with pt's daughter Patient understands diagnosis:  Yes Discussing patient identified problems/goals with staff:  Yes Medical problems stabilized or resolved:  Yes Denies suicidal/homicidal ideation: Yes Issues/concerns per patient self-inventory:  Yes Other:  New problem(s) identified: N/A  Discharge Plan or Barriers: Pt has follow up scheduled at [place] for [facility] for outpatient medication management and therapy.    Reason for Continuation of Hospitalization: Anxiety Depression Medication Stabilization  Comments: N/A  Estimated length of stay: 1 day, d/c tomorrow  For review of initial/current patient goals, please see plan of care.  Attendees: Patient:     Family:     Physician:  Dr. Zorita Pang 06/19/2013 11:00 AM   Nursing:   Clinton Sawyer, RN 06/19/2013 11:00 AM   Clinical Social Worker:  Regan Lemming, LCSW 06/19/2013 11:00 AM   Other: Nena Polio, PA 06/19/2013 11:00 AM   Other:  Gala Romney, care coordination 06/19/2013 11:00 AM   Other:  Joette Catching, LCSW 06/19/2013 11:00 AM   Other:  Grayland Ormond, RN 06/19/2013 11:01 AM   Other: Isabella Stalling, RN 06/19/2013 11:01 AM   Other: Jules Husbands, RN 06/19/2013 11:01 AM   Other:    Other:    Other:    Other:     Scribe for Treatment Team:   Ane Payment, 06/19/2013 11:00 AM

## 2013-06-20 LAB — LITHIUM LEVEL: LITHIUM LVL: 0.78 meq/L — AB (ref 0.80–1.40)

## 2013-06-20 MED ORDER — BUPROPION HCL ER (XL) 150 MG PO TB24: 150.0000 mg | ORAL_TABLET | Freq: Every day | ORAL | Status: DC

## 2013-06-20 MED ORDER — DOXEPIN HCL 25 MG PO CAPS
25.0000 mg | ORAL_CAPSULE | Freq: Every evening | ORAL | Status: DC | PRN
  Filled 2013-06-20: qty 3

## 2013-06-20 MED ORDER — LITHIUM CARBONATE 300 MG PO CAPS: ORAL_CAPSULE | ORAL | Status: DC

## 2013-06-20 MED ORDER — DIAZEPAM 5 MG PO TABS: 5.0000 mg | ORAL_TABLET | Freq: Three times a day (TID) | ORAL | Status: DC | PRN

## 2013-06-20 MED ORDER — DULOXETINE HCL 60 MG PO CPEP: 60.0000 mg | ORAL_CAPSULE | Freq: Two times a day (BID) | ORAL | Status: DC

## 2013-06-20 MED ORDER — DOXEPIN HCL 25 MG PO CAPS: ORAL_CAPSULE | ORAL | Status: DC

## 2013-06-20 NOTE — Discharge Summary (Signed)
Physician Discharge Summary Note  Patient:  Caitlyn Bautista is an 63 y.o., female MRN:  [id number] DOB:  26-Jan-1951 Patient phone:  [phone] (home)  Patient address:   [address] [place] [address],  Total Time spent with patient: 30 minutes  Date of Admission:  06/13/2013 Date of Discharge:06/20/2013  Reason for Admission: MDD severe with suicidal ideation  Discharge Diagnoses: Active Problems:   Bipolar 1 disorder, depressed   Psychiatric Specialty Exam:  Please see D/C SRA Physical Exam  ROS  Blood pressure 136/84, pulse 92, temperature 98.2 F (36.8 C), temperature source Oral, resp. rate 18, height 5\' 7"  (1.702 m), weight 125.193 kg (276 lb), SpO2 100.00%.Body mass index is 43.22 kg/(m^2).  General Appearance:   Eye Contact::    Speech:    Volume:    Mood:    Affect:    Thought Process:    Orientation:    Thought Content:    Suicidal Thoughts:    Homicidal Thoughts:    Memory:    Judgement:    Insight:    Psychomotor Activity:    Concentration:    Recall:    Fund of Knowledge:  Language:   Akathisia:    Handed:    AIMS (if indicated):     Assets:    Sleep:  Number of Hours: 5.5    Past Psychiatric History: Diagnosis:  Hospitalizations:  Outpatient Care:  Substance Abuse Care:  Self-Mutilation:  Suicidal Attempts:  Violent Behaviors:   Musculoskeletal: Strength & Muscle Tone:  Gait & Station:  Patient leans:   DSM5:  Schizophrenia Disorders:   Obsessive-Compulsive Disorders:   Trauma-Stressor Disorders:   Substance/Addictive Disorders:   Depressive Disorders:  Major Depressive Disorder - with Psychotic Features (296.24)  Axis Diagnosis:  AXIS I: Bipolar, Depressed  AXIS II: Deferred  AXIS III:  Past Medical History   Diagnosis  Date   .  PTSD (post-traumatic stress disorder)    .  Anxiety    .  Fibromyalgia    .  Bipolar disorder    .  Arthritis    .  Hypertension    .  Thyroid disease    .  Carpal tunnel syndrome, bilateral     .  Asthma    .  History of chicken pox    .  Depression    .  Hyperlipidemia    .  Incontinence of urine in female    .  Arachnoiditis     AXIS IV: other psychosocial or environmental problems, problems related to social environment and problems with primary support group  AXIS V: 61-70 mild symptoms     Level of Care:  OP  Hospital Course:        Patient is a 63 year old AAF With a history of Bipolar depression presented to the ED reporting worsening depression over the past several weeks to the point of planning her own death by using rat poison         Caitlyn Bautista was admitted to the adult unit where she was evaluated and her symptoms were identified. Medication management was discussed and implemented. She was encouraged to participate in unit programming. Medical problems were identified and treated appropriately. Home medication was restarted as needed.                        Caitlyn Bautista was evaluated each day by a clinical provider to ascertain the patient's response to treatment.  Improvement was noted by  the patient's report of decreasing symptoms, improved sleep and appetite, affect, medication tolerance, behavior, and participation in unit programming.          She was asked each day to complete a self inventory noting mood, mental status, pain, new symptoms, anxiety and concerns.         She responded well to medication and being in a therapeutic and supportive environment. Positive and appropriate behavior was noted and the patient was motivated for recovery.          She was scheduled for discharge, but late in the afternoon, felt anxious and unready to return home at that time. Her discharge was cancelled and she remained on the unit. Her lithium level was increased to 600mg  at lunch, and 900mg  at hs. She tolerated the increase well and felt no adverse effects.          Within 48 hours Caitlyn Bautista reported feeling much better and was ready to return home. She denied SI/HI or AVH. She was in  much improved condition than upon arrival and felt that her admission had been beneficial to her. She was thankful for the time and care she received at [facility].  She was motivated to continue taking medication with a goal of continued improvement in mental health.          Caitlyn Bautista was discharged home with a plan to follow up as noted below.   Consults:  None  Significant Diagnostic Studies:  None  Discharge Vitals:   Blood pressure 136/84, pulse 92, temperature 98.2 F (36.8 C), temperature source Oral, resp. rate 18, height 5\' 7"  (1.702 m), weight 125.193 kg (276 lb), SpO2 100.00%. Body mass index is 43.22 kg/(m^2). Lab Results:   Results for orders placed during the hospital encounter of 06/13/13 (from the past 72 hour(s))  LITHIUM LEVEL     Status: Abnormal   Collection Time    06/20/13  6:19 AM      Result Value Ref Range   Lithium Lvl 0.78 (*) 0.80 - 1.40 mEq/L   Comment: Performed at [facility]    Physical Findings: AIMS: Facial and Oral Movements Muscles of Facial Expression: None, normal Lips and Perioral Area: None, normal Jaw: None, normal Tongue: None, normal,Extremity Movements Upper (arms, wrists, hands, fingers): None, normal Lower (legs, knees, ankles, toes): None, normal, Trunk Movements Neck, shoulders, hips: None, normal, Overall Severity Severity of abnormal movements (highest score from questions above): None, normal Incapacitation due to abnormal movements: None, normal Patient's awareness of abnormal movements (rate only patient's report): No Awareness, Dental Status Current problems with teeth and/or dentures?: No Does patient usually wear dentures?: No  CIWA:  CIWA-Ar Total: 1 COWS:  COWS Total Score: 2  Psychiatric Specialty Exam: See Psychiatric Specialty Exam and Suicide Risk Assessment completed by Attending Physician prior to discharge.  Discharge destination:  Home  Is patient on multiple antipsychotic therapies at  discharge:  No   Has Patient had three or more failed trials of antipsychotic monotherapy by history:  No  Recommended Plan for Multiple Antipsychotic Therapies: NA  Discharge Orders   Future Appointments Provider Department Dept Phone   06/21/2013 2:30 PM Webb Silversmith, NP [facility] [place] [phone]   Future Orders Complete By Expires   Diet - low sodium heart healthy  As directed    Increase activity as tolerated As directed     As directed        Medication List  Indication   albuterol 108 (90 BASE) MCG/ACT inhaler  Commonly known as:  PROVENTIL HFA;VENTOLIN HFA  Inhale 2 puffs into the lungs daily. For wheezing.   Indication:  Asthma     amLODipine 10 MG tablet  Commonly known as:  NORVASC  Take 1 tablet (10 mg total) by mouth daily. For hypertension.   Indication:  High Blood Pressure     buPROPion 150 MG 24 hr tablet  Commonly known as:  WELLBUTRIN XL  Take 1 tablet (150 mg total) by mouth daily.   Indication:  Major Depressive Disorder     cholecalciferol 1000 UNITS tablet  Commonly known as:  VITAMIN D  Take 1 tablet (1,000 Units total) by mouth daily.   Indication:  Vitamin D Deficiency     diazepam 5 MG tablet  Commonly known as:  VALIUM  Take 1 tablet (5 mg total) by mouth 3 (three) times daily as needed for anxiety.   Indication:  Feeling Anxious     doxepin 25 MG capsule  Commonly known as:  SINEQUAN  Take one capsule at bedtime if needed for insomnia.   Indication:  insomnia     DULoxetine 60 MG capsule  Commonly known as:  CYMBALTA  Take 1 capsule (60 mg total) by mouth 2 (two) times daily.   Indication:  Fibromyalgia Syndrome, Major Depressive Disorder     fexofenadine 180 MG tablet  Commonly known as:  ALLEGRA  Take 1 tablet (180 mg total) by mouth daily. For seasonal allergies.   Indication:  Hayfever     fluticasone 50 MCG/ACT nasal spray  Commonly known as:  FLONASE  Place 2 sprays into the nose daily. For  seasonal allergies.      lithium carbonate 300 MG capsule  Take two capsules (600mg ) by mouth with breakfast. Take 3 capsules (900mg ) with dinner for mood stabilization.   Indication:  Manic-Depression     ranitidine 75 MG tablet  Commonly known as:  ZANTAC  Take 1 tablet (75 mg total) by mouth 2 (two) times daily.   Indication:  Gastroesophageal Reflux Disease     traMADol 50 MG tablet  Commonly known as:  ULTRAM  Take 1 tablet (50 mg total) by mouth every 8 (eight) hours as needed for pain.            Follow-up Information   [facility] with [facility] On 06/27/2013. (Appointment scheduled at 3:00 pm on this date with Lexine Baton for therapy.)    Contact information:   [address] [place], [place] [address] Phone: [phone] Fax: [phone]      Follow up with [facility] On 06/25/2013. (Appointment scheduled at 2:50 pm on this date with Dr. Darleene Cleaver for medication management)    Contact information:   [address] [place], [place] [address] Phone: [phone] Fax: [phone]      Follow-up recommendations:     Comments:    Total Discharge Time:  Greater than 30 minutes.  Signed: Nena Polio 06/20/2013, 10:22 AM  Patient was seen face-to-face for psychiatric evaluation, suicide risk assessment, case discussed with the treatment team and a physician extender. Made appropriate disposition plan. Reviewed the information documented and agree with the treatment plan.   Parke Simmers Catheleen Langhorne 07/05/2013 8:08 PM

## 2013-06-20 NOTE — Progress Notes (Signed)
Adult Psychoeducational Group Note  Date:  06/20/2013 Time:  10:00am Group Topic/Focus:  Making Healthy Choices:   The focus of this group is to help patients identify negative/unhealthy choices they were using prior to admission and identify positive/healthier coping strategies to replace them upon discharge.  Participation Level:  Active  Participation Quality:  Appropriate and Attentive  Affect:  Appropriate  Cognitive:  Alert and Appropriate  Insight: Appropriate  Engagement in Group:  Engaged  Modes of Intervention:  Discussion and Education  Additional Comments:   Pt. Attended and participated in group. Group discussion was on Leisure and lifestyle changes. The question was asked what is one change you could make to improve your lifestyle? Pt stated to get out more regular and don't isolate.  Gailen Shelter 06/20/2013, 11:16 AM

## 2013-06-20 NOTE — Progress Notes (Signed)
Adult Psychoeducational Group Note  Date:  06/20/2013 Time:  9:00 AM  Group Topic/Focus:  Morning Wellness Group  Participation Level:  Active  Participation Quality:  Attentive  Affect:  Appropriate  Cognitive:  Alert and Oriented  Insight: Good  Engagement in Group:  Engaged  Modes of Intervention:  Discussion  Additional Comments:  Patient voiced that she would like to be help to others through her religion.  Kathlen Brunswick 06/20/2013, 11:22 AM

## 2013-06-20 NOTE — Progress Notes (Signed)
Pt. Discharged per MD orders;  PT. Currently denies any HI/SI or AVH.  Pt. Was given education regarding follow up appointments and medications by RN.  Pt. Denies any questions or concerns about the medications.  Pt. Was escorted to the search room to retrieve her belongings by RN before being discharged to the hospital lobby.  

## 2013-06-20 NOTE — [facility] Suicide Risk Assessment (Signed)
   Demographic Factors:  Adolescent or young adult, Low socioeconomic status, Living alone and diabled  Total Time spent with patient: 30 minutes  Psychiatric Specialty Exam: Physical Exam  ROS  Blood pressure 136/84, pulse 92, temperature 98.2 F (36.8 C), temperature source Oral, resp. rate 18, height 5\' 7"  (1.702 m), weight 125.193 kg (276 lb), SpO2 100.00%.Body mass index is 43.22 kg/(m^2).  General Appearance: Casual  Eye Contact::  Good  Speech:  Clear and Coherent  Volume:  Decreased  Mood:  Euphoric  Affect:  Appropriate and Congruent  Thought Process:  Coherent and Goal Directed  Orientation:  Full (Time, Place, and Person)  Thought Content:  WDL  Suicidal Thoughts:  No  Homicidal Thoughts:  No  Memory:  Immediate;   Good Recent;   Good  Judgement:  Intact  Insight:  Good  Psychomotor Activity:  Normal and Base line activity and walks with cane.  Concentration:  Good  Recall:  Good  Fund of Knowledge:Good  Language: Good  Akathisia:  NA  Handed:  Right  AIMS (if indicated):     Assets:  Communication Skills Desire for Improvement Financial Resources/Insurance Housing Leisure Time Resilience Social Support Transportation  Sleep:  Number of Hours: 5.5    Musculoskeletal: Strength & Muscle Tone: within normal limits Gait & Station: normal Patient leans: N/A   Mental Status Per Nursing Assessment::   On Admission:  Suicidal ideation indicated by patient;Suicide plan;Self-harm thoughts;Belief that plan would result in death  Current Mental Status by Physician: NA  Loss Factors: Financial problems/change in socioeconomic status  Historical Factors: Prior suicide attempts, Family history of mental illness or substance abuse and Impulsivity  Risk Reduction Factors:   Sense of responsibility to family, Religious beliefs about death, Positive social support, Positive therapeutic relationship and Positive coping skills or problem solving  skills  Continued Clinical Symptoms:  Bipolar Disorder:   Depressive phase Depression:   Recent sense of peace/wellbeing Chronic Pain Previous Psychiatric Diagnoses and Treatments Medical Diagnoses and Treatments/Surgeries  Cognitive Features That Contribute To Risk:  Polarized thinking    Suicide Risk:  Minimal: No identifiable suicidal ideation.  Patients presenting with no risk factors but with morbid ruminations; may be classified as minimal risk based on the severity of the depressive symptoms  Discharge Diagnoses:   AXIS I:  Bipolar, Depressed AXIS II:  Deferred AXIS III:   Past Medical History  Diagnosis Date  . PTSD (post-traumatic stress disorder)   . Anxiety   . Fibromyalgia   . Bipolar disorder   . Arthritis   . Hypertension   . Thyroid disease   . Carpal tunnel syndrome, bilateral   . Asthma   . History of chicken pox   . Depression   . Hyperlipidemia   . Incontinence of urine in female   . Arachnoiditis    AXIS IV:  other psychosocial or environmental problems, problems related to social environment and problems with primary support group AXIS V:  61-70 mild symptoms  Plan Of Care/Follow-up recommendations:  Activity:  As tolerated Diet:  Regular  Is patient on multiple antipsychotic therapies at discharge:  No   Has Patient had three or more failed trials of antipsychotic monotherapy by history:  No  Recommended Plan for Multiple Antipsychotic Therapies: NA    Janardhaha R Pearlene Teat 06/20/2013, 10:50 AM

## 2013-06-20 NOTE — Progress Notes (Signed)
Pt reports she is doing better and is glad she was able to stay a couple of days longer.  She feels she has gotten a lot out of groups and will be able to return home and uses her learned coping skills.  She denies SI/HI/AV at this time.  Pt makes her needs known to staff.  Pt plans to return home and continue taking her prescribed meds.  Support and encouragement offered.  Safety maintained with q15 minute checks.

## 2013-06-21 ENCOUNTER — Ambulatory Visit: Payer: Self-pay | Admitting: Internal Medicine

## 2013-06-24 ENCOUNTER — Encounter: Payer: Self-pay | Admitting: Internal Medicine

## 2013-06-24 ENCOUNTER — Ambulatory Visit (INDEPENDENT_AMBULATORY_CARE_PROVIDER_SITE_OTHER): Payer: Medicare Other | Admitting: Internal Medicine

## 2013-06-24 VITALS — BP 134/86 | HR 89 | Temp 98.4°F | Wt 279.5 lb

## 2013-06-24 DIAGNOSIS — F329 Major depressive disorder, single episode, unspecified: Secondary | ICD-10-CM

## 2013-06-24 DIAGNOSIS — K219 Gastro-esophageal reflux disease without esophagitis: Secondary | ICD-10-CM

## 2013-06-24 DIAGNOSIS — M797 Fibromyalgia: Secondary | ICD-10-CM

## 2013-06-24 DIAGNOSIS — IMO0001 Reserved for inherently not codable concepts without codable children: Secondary | ICD-10-CM

## 2013-06-24 DIAGNOSIS — J309 Allergic rhinitis, unspecified: Secondary | ICD-10-CM

## 2013-06-24 DIAGNOSIS — I10 Essential (primary) hypertension: Secondary | ICD-10-CM

## 2013-06-24 MED ORDER — TRAMADOL HCL 50 MG PO TABS: 50.0000 mg | ORAL_TABLET | Freq: Three times a day (TID) | ORAL | Status: DC | PRN

## 2013-06-24 MED ORDER — FEXOFENADINE HCL 180 MG PO TABS: 180.0000 mg | ORAL_TABLET | Freq: Every day | ORAL | Status: AC

## 2013-06-24 MED ORDER — RANITIDINE HCL 75 MG PO TABS: 75.0000 mg | ORAL_TABLET | Freq: Two times a day (BID) | ORAL | Status: DC

## 2013-06-24 MED ORDER — ALBUTEROL SULFATE HFA 108 (90 BASE) MCG/ACT IN AERS: 2.0000 | INHALATION_SPRAY | Freq: Every day | RESPIRATORY_TRACT | Status: DC

## 2013-06-24 MED ORDER — DOXEPIN HCL 25 MG PO CAPS: ORAL_CAPSULE | ORAL | Status: DC

## 2013-06-24 MED ORDER — AMLODIPINE BESYLATE 10 MG PO TABS: 10.0000 mg | ORAL_TABLET | Freq: Every day | ORAL | Status: DC

## 2013-06-24 MED ORDER — FLUTICASONE PROPIONATE 50 MCG/ACT NA SUSP: 2.0000 | Freq: Every day | NASAL | Status: DC

## 2013-06-24 NOTE — Assessment & Plan Note (Signed)
Well controlled Continue current therapy 

## 2013-06-24 NOTE — Assessment & Plan Note (Signed)
Controlled on zantac Medication refilled today

## 2013-06-24 NOTE — Assessment & Plan Note (Signed)
Controlled on allegra Medication refilled today

## 2013-06-24 NOTE — Progress Notes (Signed)
Pre visit review using our clinic review tool, if applicable. No additional management support is needed unless otherwise documented below in the visit note. 

## 2013-06-24 NOTE — Progress Notes (Signed)
Subjective:    Patient ID: Caitlyn Bautista, female    DOB: Feb 16, 1951, 63 y.o.   MRN: [id number]  HPI  Pt presents to to the clinic today to f/u  health admission. She presented to the ED 06/13/13 with worsening depression and SI. 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 She participated in group therapy along with medical management with wellbutrin, valium, cymbalta and lithium. She was discharged 06/20/13. She already has appts set up with Behavioral health for outpatient therapy. Since her discharge, she has been feeling well. She has a little more anxiety than depression but denies SI/HI.  Additionally, she needs med refills of norvasc, allegra and zantac.  Review of Systems      Past Medical History  Diagnosis Date  . PTSD (post-traumatic stress disorder)   . Anxiety   . Fibromyalgia   . Bipolar disorder   . Arthritis   . Hypertension   . Thyroid disease   . Carpal tunnel syndrome, bilateral   . Asthma   . History of chicken pox   . Depression   . Hyperlipidemia   . Incontinence of urine in female   . Arachnoiditis     Current Outpatient Prescriptions  Medication Sig Dispense Refill  . albuterol (PROVENTIL HFA;VENTOLIN HFA) 108 (90 BASE) MCG/ACT inhaler Inhale 2 puffs into the lungs daily. For wheezing.  1 Inhaler  0  . amLODipine (NORVASC) 10 MG tablet Take 1 tablet (10 mg total) by mouth daily. For hypertension.  90 tablet  1  . buPROPion (WELLBUTRIN XL) 150 MG 24 hr tablet Take 1 tablet (150 mg total) by mouth daily.  30 tablet  0  . cholecalciferol (VITAMIN D) 1000 UNITS tablet Take 1 tablet (1,000 Units total) by mouth daily.      . diazepam (VALIUM) 5 MG tablet Take 1 tablet (5 mg total) by mouth 3 (three) times daily as needed for anxiety.  30 tablet  0  . doxepin (SINEQUAN) 25 MG capsule Take one capsule at bedtime if needed for insomnia.  30 capsule  0  . DULoxetine (CYMBALTA) 60 MG  capsule Take 1 capsule (60 mg total) by mouth 2 (two) times daily.  60 capsule  0  . fexofenadine (ALLEGRA) 180 MG tablet Take 1 tablet (180 mg total) by mouth daily. For seasonal allergies.      . fluticasone (FLONASE) 50 MCG/ACT nasal spray Place 2 sprays into the nose daily. For seasonal allergies.      Marland Kitchen lithium carbonate 300 MG capsule Take two capsules (600mg ) by mouth with breakfast. Take 3 capsules (900mg ) with dinner for mood stabilization.  150 capsule  0  . ranitidine (ZANTAC) 75 MG tablet Take 1 tablet (75 mg total) by mouth 2 (two) times daily.      . traMADol (ULTRAM) 50 MG tablet Take 1 tablet (50 mg total) by mouth every 8 (eight) hours as needed for pain.  90 tablet  2   No current facility-administered medications for this visit.    Allergies  Allergen Reactions  . Ace Inhibitors Other (See Comments)    Cough, asthma attack  . Decongestant [Pseudoephedrine Hcl] Other (See Comments)    High blood pressure  . Nsaids     Family History  Problem Relation Age of Onset  . Arthritis Other     Parent  . Hypertension Other     Parent  . Early death Other  Parent  . Arthritis Other     Grandparent  . Cancer Other     Ovarian/Uterine Cancer-Grandmother  . Hypertension Other     Grandparent  . Arthritis Other     Other Blood Relative  . Cancer Other     Ovarian/Uterine Cancer-Other Blood Relative  . Breast cancer Other     Other Blood Relative  . Hypertension Other     Other Blood Relative  . Diabetes Other     Other Blood Relative    History   Social History  . Marital Status: Single    Spouse Name: N/A    Number of Children: 4  . Years of Education: 11   Occupational History  . Disabled    Social History Main Topics  . Smoking status: Never Smoker   . Smokeless tobacco: Never Used  . Alcohol Use: Yes     Comment: Rarely  . Drug Use: No  . Sexual Activity: No   Other Topics Concern  . Not on file   Social History Narrative   Regular  exercise-no   Caffeine Use-yes     Constitutional: Denies fever, malaise, fatigue, headache or abrupt weight changes.  Psych: Pt reports anxiety and depression. Denies active SI/HI.  No other specific complaints in a complete review of systems (except as listed in HPI above).  Objective:   Physical Exam  BP 134/86  Pulse 89  Temp(Src) 98.4 F (36.9 C) (Oral)  Wt 279 lb 8 oz (126.78 kg)  SpO2 98% Wt Readings from Last 3 Encounters:  06/24/13 279 lb 8 oz (126.78 kg)  06/13/13 276 lb (125.193 kg)  06/13/13 273 lb (123.832 kg)    General: Appears her stated age, well developed, well nourished in NAD. Cardiovascular: Normal rate and rhythm. S1,S2 noted.  No murmur, rubs or gallops noted. No JVD or BLE edema. No carotid bruits noted. Pulmonary/Chest: Normal effort and positive vesicular breath sounds. No respiratory distress. No wheezes, rales or ronchi noted.  Psychiatric: Mood and affect normal. Behavior is normal. Judgment and thought content normal.    BMET    Component Value Date/Time   NA 139 06/13/2013 0104   K 3.7 06/13/2013 0104   CL 100 06/13/2013 0104   CO2 26 06/13/2013 0104   GLUCOSE 87 06/13/2013 0104   BUN 10 06/13/2013 0104   CREATININE 0.74 06/13/2013 0104   CALCIUM 10.0 06/13/2013 0104   GFRNONAA >90 06/13/2013 0104   GFRAA >90 06/13/2013 0104    Lipid Panel     Component Value Date/Time   CHOL 196 10/11/2012 1502   TRIG 83.0 10/11/2012 1502   HDL 43.00 10/11/2012 1502   CHOLHDL 5 10/11/2012 1502   VLDL 16.6 10/11/2012 1502   LDLCALC 136* 10/11/2012 1502    CBC    Component Value Date/Time   WBC 8.2 06/13/2013 0104   RBC 4.21 06/13/2013 0104   HGB 11.9* 06/13/2013 0104   HCT 37.4 06/13/2013 0104   PLT 409* 06/13/2013 0104   MCV 88.8 06/13/2013 0104   MCH 28.3 06/13/2013 0104   MCHC 31.8 06/13/2013 0104   RDW 15.0 06/13/2013 0104   LYMPHSABS 2.7 09/15/2011 2013   MONOABS 0.7 09/15/2011 2013   EOSABS 0.4 09/15/2011 2013   BASOSABS 0.0 09/15/2011 2013    Hgb A1C Lab Results  Component  Value Date   HGBA1C 5.2 10/11/2012         Assessment & Plan:

## 2013-06-24 NOTE — Assessment & Plan Note (Signed)
Reviewed notes from recent hospital admission Time to review notes approx 15 minutes Continue current therapy for now Continue to folllow with [facility]

## 2013-06-24 NOTE — Patient Instructions (Addendum)

## 2013-06-25 ENCOUNTER — Telehealth: Payer: Self-pay | Admitting: Internal Medicine

## 2013-06-25 NOTE — Progress Notes (Signed)
Patient Discharge Instructions:  After Visit Summary (AVS):   Faxed to:  06/25/13 Discharge Summary Note:   Faxed to:  06/25/13 Psychiatric Admission Assessment Note:   Faxed to:  06/25/13 Suicide Risk Assessment - Discharge Assessment:   Faxed to:  06/25/13 Faxed/Sent to the [facility] Care provider:  06/25/13 Faxed to [facility] @ [phone] Faxed to [facility] [place], 06/25/2013, 2:17 PM

## 2013-06-25 NOTE — Telephone Encounter (Signed)
Relevant patient education mailed to patient.  

## 2014-01-23 ENCOUNTER — Telehealth: Payer: Self-pay | Admitting: Internal Medicine

## 2014-01-23 NOTE — Telephone Encounter (Signed)
Pt request to transfer from [facility] to Dr. Doug Sou. Pt would like to come to [facility] location. Please advise.

## 2014-01-24 NOTE — Telephone Encounter (Signed)
Fine with me

## 2014-01-24 NOTE — Telephone Encounter (Signed)
Dr. Doug Sou. Please advise.

## 2014-01-27 NOTE — Telephone Encounter (Signed)
Due to patient's multiple no show visits in the computer history I would request that prior to PCP being changed in the computer patient actually showed for visit. If she no-shows to her establish care visit will not give second visit.

## 2014-02-10 ENCOUNTER — Ambulatory Visit (INDEPENDENT_AMBULATORY_CARE_PROVIDER_SITE_OTHER): Payer: Medicare Other | Admitting: Family

## 2014-02-10 ENCOUNTER — Encounter: Payer: Self-pay | Admitting: Family

## 2014-02-10 VITALS — BP 150/96 | HR 81 | Temp 98.2°F | Resp 18 | Ht 67.0 in | Wt 287.0 lb

## 2014-02-10 DIAGNOSIS — I10 Essential (primary) hypertension: Secondary | ICD-10-CM

## 2014-02-10 MED ORDER — AMLODIPINE BESYLATE 10 MG PO TABS: 10.0000 mg | ORAL_TABLET | Freq: Every day | ORAL | Status: DC

## 2014-02-10 NOTE — Assessment & Plan Note (Addendum)
Blood pressure appears stable on amlodipine. Patient ran out of medications. Refill amlodipine and continue at current dose. patient indicates unable to get a pharmacy until tomorrow. Sample of Bystolic 5 mg given to bridge. Patient advised to schedule eye exam. Continue monitoring with new PCP.

## 2014-02-10 NOTE — Progress Notes (Signed)
Subjective:    Patient ID: Caitlyn Bautista, female    DOB: 08-13-1950, 63 y.o.   MRN: [id number]  Chief Complaint  Patient presents with  . Medication Refill    would like BP meds filled until she can see dr. Doug Sou next year, also has been having bad "BP headaches" wanted to see if she could get something to last until tomorrow    HPI:  Caitlyn Bautista is a 63 y.o. female who presents today for medication refill.  1) Hypertension - Currently maintained on amlodipine. Denies chest pain/discomfort, shortness of breath, or edema. Last eye exam has been "too long."   BP Readings from Last 3 Encounters:  02/10/14 150/96  06/24/13 134/86  06/20/13 136/84   Allergies  Allergen Reactions  . Ace Inhibitors Other (See Comments)    Cough, asthma attack  . Decongestant [Pseudoephedrine Hcl] Other (See Comments)    High blood pressure  . Nsaids    Current Outpatient Prescriptions on File Prior to Visit  Medication Sig Dispense Refill  . albuterol (PROVENTIL HFA;VENTOLIN HFA) 108 (90 BASE) MCG/ACT inhaler Inhale 2 puffs into the lungs daily. For wheezing. 1 Inhaler 0  . buPROPion (WELLBUTRIN XL) 150 MG 24 hr tablet Take 1 tablet (150 mg total) by mouth daily. 30 tablet 0  . cholecalciferol (VITAMIN D) 1000 UNITS tablet Take 1 tablet (1,000 Units total) by mouth daily.    . diazepam (VALIUM) 5 MG tablet Take 1 tablet (5 mg total) by mouth 3 (three) times daily as needed for anxiety. 30 tablet 0  . doxepin (SINEQUAN) 25 MG capsule Take one capsule at bedtime if needed for insomnia. 30 capsule 0  . DULoxetine (CYMBALTA) 60 MG capsule Take 60 mg by mouth daily. Take 2 in the AM and 3 at bedtime    . fexofenadine (ALLEGRA) 180 MG tablet Take 1 tablet (180 mg total) by mouth daily. For seasonal allergies.    . fluticasone (FLONASE) 50 MCG/ACT nasal spray Place 2 sprays into both nostrils daily. For seasonal allergies. 16 g 0  . lithium carbonate 300 MG capsule Take two capsules (600mg ) by mouth with  breakfast. Take 3 capsules (900mg ) with dinner for mood stabilization. 150 capsule 0  . ranitidine (ZANTAC) 75 MG tablet Take 1 tablet (75 mg total) by mouth 2 (two) times daily.    . traMADol (ULTRAM) 50 MG tablet Take 1 tablet (50 mg total) by mouth every 8 (eight) hours as needed. 90 tablet 2   No current facility-administered medications on file prior to visit.   Review of Systems    See HPI  Objective:    BP 150/96 mmHg  Pulse 81  Temp(Src) 98.2 F (36.8 C) (Oral)  Resp 18  Ht 5\' 7"  (1.702 m)  Wt 287 lb (130.182 kg)  BMI 44.94 kg/m2  SpO2 97% Nursing note and vital signs reviewed.  Physical Exam  Constitutional: She is oriented to person, place, and time. She appears well-developed and well-nourished. No distress.  Obese female seated in a chair; dressed appropriately; appears stated age; Donzetta Matters noted that her right side.  Cardiovascular: Normal rate, regular rhythm, normal heart sounds and intact distal pulses.   Pulmonary/Chest: Effort normal and breath sounds normal.  Neurological: She is alert and oriented to person, place, and time.  Skin: Skin is warm and dry.  Psychiatric: She has a normal mood and affect. Her behavior is normal. Judgment and thought content normal.       Assessment & Plan:

## 2014-02-10 NOTE — Progress Notes (Signed)
Pre visit review using our clinic review tool, if applicable. No additional management support is needed unless otherwise documented below in the visit note. 

## 2014-02-10 NOTE — Patient Instructions (Signed)
Thank you for choosing [facility].  Summary/Instructions:  Your prescription(s) have been submitted to your pharmacy. Please take as directed and contact our office if you believe you are having problem(s) with the medication(s).  If your symptoms worsen or fail to improve, please contact our office for further instruction, or in case of emergency go directly to the emergency room at the closest medical facility.   Hypertension Hypertension, commonly called high blood pressure, is when the force of blood pumping through your arteries is too strong. Your arteries are the blood vessels that carry blood from your heart throughout your body. A blood pressure reading consists of a higher number over a lower number, such as 110/72. The higher number (systolic) is the pressure inside your arteries when your heart pumps. The lower number (diastolic) is the pressure inside your arteries when your heart relaxes. Ideally you want your blood pressure below 120/80. Hypertension forces your heart to work harder to pump blood. Your arteries may become narrow or stiff. Having hypertension puts you at risk for heart disease, stroke, and other problems.  RISK FACTORS Some risk factors for high blood pressure are controllable. Others are not.  Risk factors you cannot control include:   Race. You may be at higher risk if you are African American.  Age. Risk increases with age.  Gender. Men are at higher risk than women before age 31 years. After age 76, women are at higher risk than men. Risk factors you can control include:  Not getting enough exercise or physical activity.  Being overweight.  Getting too much fat, sugar, calories, or salt in your diet.  Drinking too much alcohol. SIGNS AND SYMPTOMS Hypertension does not usually cause signs or symptoms. Extremely high blood pressure (hypertensive crisis) may cause headache, anxiety, shortness of breath, and nosebleed. DIAGNOSIS  To check if you  have hypertension, your health care provider will measure your blood pressure while you are seated, with your arm held at the level of your heart. It should be measured at least twice using the same arm. Certain conditions can cause a difference in blood pressure between your right and left arms. A blood pressure reading that is higher than normal on one occasion does not mean that you need treatment. If one blood pressure reading is high, ask your health care provider about having it checked again. TREATMENT  Treating high blood pressure includes making lifestyle changes and possibly taking medicine. Living a healthy lifestyle can help lower high blood pressure. You may need to change some of your habits. Lifestyle changes may include:  Following the DASH diet. This diet is high in fruits, vegetables, and whole grains. It is low in salt, red meat, and added sugars.  Getting at least 2 hours of brisk physical activity every week.  Losing weight if necessary.  Not smoking.  Limiting alcoholic beverages.  Learning ways to reduce stress. If lifestyle changes are not enough to get your blood pressure under control, your health care provider may prescribe medicine. You may need to take more than one. Work closely with your health care provider to understand the risks and benefits. HOME CARE INSTRUCTIONS  Have your blood pressure rechecked as directed by your health care provider.   Take medicines only as directed by your health care provider. Follow the directions carefully. Blood pressure medicines must be taken as prescribed. The medicine does not work as well when you skip doses. Skipping doses also puts you at risk for problems.  Do not smoke.   Monitor your blood pressure at home as directed by your health care provider. SEEK MEDICAL CARE IF:   You think you are having a reaction to medicines taken.  You have recurrent headaches or feel dizzy.  You have swelling in your  ankles.  You have trouble with your vision. SEEK IMMEDIATE MEDICAL CARE IF:  You develop a severe headache or confusion.  You have unusual weakness, numbness, or feel faint.  You have severe chest or abdominal pain.  You vomit repeatedly.  You have trouble breathing. MAKE SURE YOU:   Understand these instructions.  Will watch your condition.  Will get help right away if you are not doing well or get worse. Document Released: 02/21/2005 Document Revised: 07/08/2013 Document Reviewed: 12/14/2012 [facility] Patient Information 2015 [place], [place]. This information is not intended to replace advice given to you by your health care provider. Make sure you discuss any questions you have with your health care provider.

## 2014-04-11 ENCOUNTER — Other Ambulatory Visit (INDEPENDENT_AMBULATORY_CARE_PROVIDER_SITE_OTHER): Payer: Medicare Other

## 2014-04-11 ENCOUNTER — Ambulatory Visit (INDEPENDENT_AMBULATORY_CARE_PROVIDER_SITE_OTHER): Payer: Medicare Other | Admitting: Internal Medicine

## 2014-04-11 ENCOUNTER — Encounter: Payer: Self-pay | Admitting: Internal Medicine

## 2014-04-11 DIAGNOSIS — M25562 Pain in left knee: Secondary | ICD-10-CM

## 2014-04-11 DIAGNOSIS — E785 Hyperlipidemia, unspecified: Secondary | ICD-10-CM

## 2014-04-11 DIAGNOSIS — M5489 Other dorsalgia: Secondary | ICD-10-CM

## 2014-04-11 DIAGNOSIS — M25561 Pain in right knee: Secondary | ICD-10-CM

## 2014-04-11 DIAGNOSIS — M549 Dorsalgia, unspecified: Secondary | ICD-10-CM | POA: Insufficient documentation

## 2014-04-11 DIAGNOSIS — I10 Essential (primary) hypertension: Secondary | ICD-10-CM

## 2014-04-11 LAB — LIPID PANEL
CHOL/HDL RATIO: 5
Cholesterol: 238 mg/dL — ABNORMAL HIGH (ref 0–200)
HDL: 44.2 mg/dL (ref >39.00)
LDL Cholesterol: 160 mg/dL — ABNORMAL HIGH (ref 0–99)
NonHDL: 193.8
Triglycerides: 171 mg/dL — ABNORMAL HIGH (ref 0.0–149.0)
VLDL: 34.2 mg/dL (ref 0.0–40.0)

## 2014-04-11 LAB — COMPREHENSIVE METABOLIC PANEL
ALT: 15 U/L (ref 0–35)
AST: 12 U/L (ref 0–37)
Albumin: 4.1 g/dL (ref 3.5–5.2)
Alkaline Phosphatase: 127 U/L — ABNORMAL HIGH (ref 39–117)
BUN: 17 mg/dL (ref 6–23)
CALCIUM: 9.6 mg/dL (ref 8.4–10.5)
CO2: 26 meq/L (ref 19–32)
Chloride: 108 mEq/L (ref 96–112)
Creatinine, Ser: 1.11 mg/dL (ref 0.40–1.20)
GFR: 64.2 mL/min (ref >60.00)
Glucose, Bld: 90 mg/dL (ref 70–99)
Potassium: 3.5 mEq/L (ref 3.5–5.1)
Sodium: 141 mEq/L (ref 135–145)
TOTAL PROTEIN: 8.1 g/dL (ref 6.0–8.3)
Total Bilirubin: 0.3 mg/dL (ref 0.2–1.2)

## 2014-04-11 LAB — HEMOGLOBIN A1C: Hgb A1c MFr Bld: 5.4 % (ref 4.6–6.5)

## 2014-04-11 MED ORDER — HYDROCODONE-ACETAMINOPHEN 10-325 MG PO TABS: 1.0000 | ORAL_TABLET | Freq: Three times a day (TID) | ORAL | Status: DC | PRN

## 2014-04-11 NOTE — Progress Notes (Signed)
Pre visit review using our clinic review tool, if applicable. No additional management support is needed unless otherwise documented below in the visit note. 

## 2014-04-11 NOTE — Assessment & Plan Note (Signed)
This is a likely contributor to her severe OA and pain as well as the HTN. Checking cholesterol and HgA1c today. Spent >10 minutes talking to her about the comorbidities associated with weight and the need to find exercise she can do.

## 2014-04-11 NOTE — Patient Instructions (Signed)
We will try some pain medicine for you that you can take up to 3 times per day. We want to see you back in about 3-4 weeks to see how you are doing. We will send you to the spine center where they do a good job with figuring out the pain and trying to do what they can to help.   We will also send you to the sports medicine doctor to see if he thinks a knee brace would be helpful for you.  We are going to check your blood work and call you back with the results.   Keep up the good work with weight loss as every pound lost takes 4 pounds of pressure off your knees. Extra weight can also make back pain worse as it puts more strain on your spine.   Back Exercises Back exercises help treat and prevent back injuries. The goal is to increase your strength in your belly (abdominal) and back muscles. These exercises can also help with flexibility. Start these exercises when told by your doctor. HOME CARE Back exercises include: Pelvic Tilt.  Lie on your back with your knees bent. Tilt your pelvis until the lower part of your back is against the floor. Hold this position 5 to 10 sec. Repeat this exercise 5 to 10 times. Knee to Chest.  Pull 1 knee up against your chest and hold for 20 to 30 seconds. Repeat this with the other knee. This may be done with the other leg straight or bent, whichever feels better. Then, pull both knees up against your chest. Sit-Ups or Curl-Ups.  Bend your knees 90 degrees. Start with tilting your pelvis, and do a partial, slow sit-up. Only lift your upper half 30 to 45 degrees off the floor. Take at least 2 to 3 seonds for each sit-up. Do not do sit-ups with your knees out straight. If partial sit-ups are difficult, simply do the above but with only tightening your belly (abdominal) muscles and holding it as told. Hip-Lift.  Lie on your back with your knees flexed 90 degrees. Push down with your feet and shoulders as you raise your hips 2 inches off the floor. Hold for 10  seconds, repeat 5 to 10 times. Back Arches.  Lie on your stomach. Prop yourself up on bent elbows. Slowly press on your hands, causing an arch in your low back. Repeat 3 to 5 times. Shoulder-Lifts.  Lie face down with arms beside your body. Keep hips and belly pressed to floor as you slowly lift your head and shoulders off the floor. Do not overdo your exercises. Be careful in the beginning. Exercises may cause you some mild back discomfort. If the pain lasts for more than 15 minutes, stop the exercises until you see your doctor. Improvement with exercise for back problems is slow.  Document Released: 03/26/2010 Document Revised: 05/16/2011 Document Reviewed: 12/23/2010 [facility] Patient Information 2015 [place], [place]. This information is not intended to replace advice given to you by your health care provider. Make sure you discuss any questions you have with your health care provider.

## 2014-04-11 NOTE — Assessment & Plan Note (Signed)
Reasonably well controlled with amlodipine 10 mg daily. If she has new diabetes would be reasonable to add ARB (ace-I caused cough). Check BMP.

## 2014-04-11 NOTE — Assessment & Plan Note (Signed)
Not currently on any cholesterol medicine so check lipid panel and expect the need to start medicine if patient willing.

## 2014-04-11 NOTE — Assessment & Plan Note (Signed)
Refer to orthopedics to see if repeat injections may be helpful. Will rx hydrocodone for pain so she can be more active to work on weight loss. She will return in 3-4 weeks and check UDS then.

## 2014-04-11 NOTE — Progress Notes (Signed)
   Subjective:    Patient ID: Caitlyn Bautista, female    DOB: [birth year], 64 y.o.   MRN: [id number]  HPI The patient is a 64 YO female who is coming in as a new patient who is having uncontrolled pain. She has been on tramadol in the past but it does not touch her pain. The pain is in her back in the low, upper. She states that today is a good day and it is 5/10, on a bad day is 10/10. It is intermittently bad but always present. It has gotten a little worse over the years. It keeps her from doing most anything. She was treated in [facility] with some injections that were helpful but she does not feel she has been treated well here. She has a comorbidity of morbid obesity and feels she is unable to exercise due to the pain. She also has some fibromyalgia, HTN (well controlled on amlodipine), bipolar disorder (reasonably stable on lithium, diazepam, doxepin, wellbutrin, cymbalta).   PMH, [place], social history, PSH, allergies, medications, problem list updated.   Review of Systems  Constitutional: Positive for activity change. Negative for fever, chills, appetite change and unexpected weight change.  HENT: Negative.   Eyes: Negative.   Respiratory: Positive for shortness of breath. Negative for cough, chest tightness and wheezing.        SOB with exertion  Cardiovascular: Negative for chest pain, palpitations and leg swelling.  Gastrointestinal: Negative for abdominal pain, diarrhea, constipation and abdominal distention.  Endocrine: Negative.   Musculoskeletal: Positive for myalgias, back pain, arthralgias and gait problem.  Skin: Negative.   Neurological: Negative.   Psychiatric/Behavioral: Negative.       Objective:   Physical Exam  Constitutional: She is oriented to person, place, and time. She appears well-developed and well-nourished.  Overweight  HENT:  Head: Normocephalic and atraumatic.  Eyes: EOM are normal.  Neck: Normal range of motion.  Cardiovascular: Normal rate and regular rhythm.     Pulmonary/Chest: Effort normal and breath sounds normal. No respiratory distress. She has no wheezes. She has no rales.  Abdominal: Soft. Bowel sounds are normal. She exhibits no distension. There is no tenderness.  Musculoskeletal: She exhibits no edema.  Neurological: She is alert and oriented to person, place, and time. Coordination normal.  Skin: Skin is warm and dry.  Psychiatric: She has a normal mood and affect.   Filed Vitals:   04/11/14 0917  BP: 130/92  Pulse: 83  Temp: 98.3 F (36.8 C)  TempSrc: Oral  Resp: 14  Height: 5\' 7"  (1.702 m)  Weight: 283 lb 1.9 oz (128.422 kg)  SpO2: 96%      Assessment & Plan:

## 2014-05-09 ENCOUNTER — Ambulatory Visit: Payer: Self-pay | Admitting: Internal Medicine

## 2014-05-09 ENCOUNTER — Ambulatory Visit: Payer: Medicare Other | Admitting: Family Medicine

## 2014-05-16 ENCOUNTER — Ambulatory Visit: Payer: Medicare Other | Admitting: Internal Medicine

## 2014-05-23 ENCOUNTER — Ambulatory Visit: Payer: Medicare Other | Admitting: Family Medicine

## 2014-06-03 ENCOUNTER — Encounter: Payer: Self-pay | Admitting: Internal Medicine

## 2014-06-03 ENCOUNTER — Telehealth: Payer: Self-pay | Admitting: Internal Medicine

## 2014-06-03 DIAGNOSIS — G039 Meningitis, unspecified: Secondary | ICD-10-CM

## 2014-06-03 NOTE — Telephone Encounter (Signed)
I would recommend that she go to the neurosurgeon for her back pain as most neurologists do not take care of back pain. If she really wants to go to neurology will place referral and you can try some other group. (have placed) this would be in addition to the neurosurgeon.

## 2014-06-03 NOTE — Telephone Encounter (Signed)
Patient states she was supposed to be referred to neurology, not neurosurgery, for her arachnoiditis. I see that she was previously referred to [facility], however they declined to see her because they do not do chronic pain management. Please advise on where you would like the patient to go for her back pain. Dr. Brien Few at [facility] does do pain management if you still wanted her to go there. Please advise.

## 2014-06-18 ENCOUNTER — Ambulatory Visit: Payer: Medicare Other | Admitting: Family Medicine

## 2014-06-18 DIAGNOSIS — Z0289 Encounter for other administrative examinations: Secondary | ICD-10-CM

## 2014-06-18 NOTE — Telephone Encounter (Signed)
Left message for patient to call office.  

## 2014-07-09 NOTE — Telephone Encounter (Signed)
Patient calling you back. She left you a voice mail yesterday. She wanted to explain she changed phone services and she didn't get your message from last month till now. Please call patient

## 2014-09-04 ENCOUNTER — Encounter: Payer: Self-pay | Admitting: Internal Medicine

## 2014-09-16 ENCOUNTER — Ambulatory Visit (INDEPENDENT_AMBULATORY_CARE_PROVIDER_SITE_OTHER): Payer: Medicare Other | Admitting: Internal Medicine

## 2014-09-16 ENCOUNTER — Encounter: Payer: Self-pay | Admitting: Internal Medicine

## 2014-09-16 VITALS — BP 128/80 | HR 74 | Temp 98.4°F | Ht 67.0 in | Wt 277.5 lb

## 2014-09-16 DIAGNOSIS — Z0000 Encounter for general adult medical examination without abnormal findings: Secondary | ICD-10-CM | POA: Diagnosis not present

## 2014-09-16 DIAGNOSIS — M5441 Lumbago with sciatica, right side: Secondary | ICD-10-CM

## 2014-09-16 DIAGNOSIS — M797 Fibromyalgia: Secondary | ICD-10-CM | POA: Diagnosis not present

## 2014-09-16 DIAGNOSIS — M5442 Lumbago with sciatica, left side: Secondary | ICD-10-CM

## 2014-09-16 MED ORDER — PREGABALIN 75 MG PO CAPS: 75.0000 mg | ORAL_CAPSULE | Freq: Two times a day (BID) | ORAL | Status: DC

## 2014-09-16 NOTE — Progress Notes (Signed)
Pre visit review using our clinic review tool, if applicable. No additional management support is needed unless otherwise documented below in the visit note. 

## 2014-09-16 NOTE — Progress Notes (Signed)
Subjective:   Caitlyn Bautista is a 64 y.o. female who presents for Medicare Annual (Subsequent) preventive examination.  Review of Systems:  HRA assessment completed during visit; Gracy Ehly  Personalized education given regarding risk on the problem list that are currently being medically managed;  Lifestyle changes reviewed HTN; obesity w BMI > 40  Pain every day / 1-5 is a 4 to 5 daily; both knees; lower spine, arms and chest and upper back Both wrist and hands; carpal tunnel; jaw and neck;  Fibro;  Sciatica neurological (military spine "straight" not curved) Neck pain; spine does not have the curve Pain hurts more when up; walking is difficult  Depressive symptoms; but seeing individual counselor and psychiatrist;    Diet: can't stand to fix meals; Given resources for food to assist with budget BMI 43.4; did not discuss  Falls; March she "almost" fell at home; trips a lot;   Exercise: not much she can do; try to do chair exercise Safety; has stairs coming in door; 4 little stairs; Has shower in tub; sit over the tub;  Smoke alarms Given information on safety at home Has a friend she can call on to get help if needed.    Screenings overdue:  Ophthalmology exam; overdue; macular degeneration/ goal eye exam this year;  Immunizations; Tetanus;  Shingles- educated to check with insurer; her immune system is very weak Colonoscopy; due Jan 2023 Mammogram; last done 2012; Plans to have this done;  PAP 03/2016/ had one last year; doesn't have to get them but will get them q 2 years EKG 09/09/2011 Hearing: right 500hz ; and left 1000hz  Dental: no; [employer] will clean teeth       Objective:     Vitals: BP 128/80 mmHg  Pulse 74  Temp(Src) 98.4 F (36.9 C) (Oral)  Ht 5\' 7"  (1.702 m)  Wt 277 lb 8 oz (125.873 kg)  BMI 43.45 kg/m2  SpO2 99%  Tobacco History  Smoking status  . Never Smoker   Smokeless tobacco  . Never Used     Counseling  given: Not Answered   Past Medical History  Diagnosis Date  . PTSD (post-traumatic stress disorder)   . Anxiety   . Fibromyalgia   . Bipolar disorder   . Arthritis   . Hypertension   . Thyroid disease   . Carpal tunnel syndrome, bilateral   . Asthma   . History of chicken pox   . Depression   . Hyperlipidemia   . Incontinence of urine in female   . Arachnoiditis    Past Surgical History  Procedure Laterality Date  . Tubal ligation    . Breast lumpectomy  1970's  . Abdominal hysterectomy  1990's  . Dilation and curettage of uterus     Family History  Problem Relation Age of Onset  . Arthritis Other     Parent  . Hypertension Other     Parent  . Early death Other     Parent  . Arthritis Other     Grandparent  . Cancer Other     Ovarian/Uterine Cancer-Grandmother  . Hypertension Other     Grandparent  . Arthritis Other     Other Blood Relative  . Cancer Other     Ovarian/Uterine Cancer-Other Blood Relative  . Breast cancer Other     Other Blood Relative  . Hypertension Other     Other Blood Relative  . Diabetes Other     Other Blood Relative  History  Sexual Activity  . Sexual Activity: No    Outpatient Encounter Prescriptions as of 09/16/2014  Medication Sig  . albuterol (PROVENTIL HFA;VENTOLIN HFA) 108 (90 BASE) MCG/ACT inhaler Inhale 2 puffs into the lungs daily. For wheezing.  Marland Kitchen amLODipine (NORVASC) 10 MG tablet Take 1 tablet (10 mg total) by mouth daily. For hypertension.  Marland Kitchen buPROPion (WELLBUTRIN XL) 150 MG 24 hr tablet Take 1 tablet (150 mg total) by mouth daily.  . cholecalciferol (VITAMIN D) 1000 UNITS tablet Take 1 tablet (1,000 Units total) by mouth daily.  . diazepam (VALIUM) 5 MG tablet Take 1 tablet (5 mg total) by mouth 3 (three) times daily as needed for anxiety.  Marland Kitchen doxepin (SINEQUAN) 25 MG capsule Take one capsule at bedtime if needed for insomnia. (Patient taking differently: Take 100 mg at bedtime if needed for insomnia.)  . DULoxetine  (CYMBALTA) 60 MG capsule Take 60 mg by mouth daily. Take 2 in the AM and 3 at bedtime  . fexofenadine (ALLEGRA) 180 MG tablet Take 1 tablet (180 mg total) by mouth daily. For seasonal allergies.  . fluticasone (FLONASE) 50 MCG/ACT nasal spray Place 2 sprays into both nostrils daily. For seasonal allergies.  Marland Kitchen HYDROcodone-acetaminophen (NORCO) 10-325 MG per tablet Take 1 tablet by mouth every 8 (eight) hours as needed.  . lithium carbonate 300 MG capsule Take two capsules (600mg ) by mouth with breakfast. Take 3 capsules (900mg ) with dinner for mood stabilization.  . ranitidine (ZANTAC) 75 MG tablet Take 1 tablet (75 mg total) by mouth 2 (two) times daily.   No facility-administered encounter medications on file as of 09/16/2014.    Activities of Daily Living No flowsheet data found.  Patient Care Team: Olga Millers, MD as PCP - General (Internal Medicine)    Assessment:    Objective:   The goal of the wellness visit is to assist the patient how to close the gaps in care and create a preventative care plan for the patient.  Personalized Education was given regarding:   Pt determined a personalized goal; deferred due to pain  Assessment included:  Taking meds without issues; no barriers identified  Labs were and fup visit noted with MD if labs are due to be re-drawn.  Stress: Recommendations for managing stress if assessed as a factor;   Educated on shingles and follow up with insurance company for co-pays or charges applied to Part D benefit. Educated on Vaccines;  Safety issues reviewed;  Cognition assessed by AD8; states she has issues with memory but depression scale was high and is active treatment under psychiatry   Functional status reviewed; no losses in function x 1 year States she is slow;    End of life planning was discussed; aging in home or other; plans to complete HCPOA were discussed if not completed Will complete at her church  Exercise Activities  and Dietary recommendations    Goals    . less pain     Less pain would assist her with her social and physical goals      Fall Risk Fall Risk  09/16/2014 10/11/2012  Falls in the past year? No No   Depression Screen PHQ 2/9 Scores 09/16/2014 10/11/2012  PHQ - 2 Score 2 0  PHQ- 9 Score 12 -     Cognitive Testing No flowsheet data found.  Immunization History  Administered Date(s) Administered  . Tdap 08/06/2007   Screening Tests Health Maintenance  Topic Date Due  . HIV Screening  12/28/1967  .  MAMMOGRAM  03/07/2010  . ZOSTAVAX  12/27/2012  . INFLUENZA VACCINE  04/12/2015 (Originally 10/06/2014)  . PAP SMEAR  03/07/2016  . TETANUS/TDAP  08/05/2017  . COLONOSCOPY  03/07/2021      Plan:   During the course of the visit the patient was educated and counseled about the following appropriate screening and preventive services:   Vaccines to include Pneumoccal, Influenza, Hepatitis B, Td, Zostavax, HCV/   Electrocardiogram  Cardiovascular Disease  Colorectal cancer screening  Bone density screening  Diabetes screening  Glaucoma screening  Mammography/PAP  Nutrition counseling   Patient Instructions (the written plan) was given to the patient.   Wynetta Fines, RN  09/16/2014

## 2014-09-16 NOTE — Patient Instructions (Addendum)
Caitlyn Bautista , Thank you for taking time to come for your Medicare Wellness Visit. I appreciate your ongoing commitment to your health goals. Please review the following plan we discussed and let me know if I can assist you in the future.   Will prioritize eye exam exam Will fup on hearing test;  Shingles declines right now [email or web address] (quick resource)    These are the goals we discussed: Goals    None      This is a list of the screening recommended for you and due dates:  Health Maintenance  Topic Date Due  . HIV Screening  12/28/1967  . Tetanus Vaccine  12/28/1971  . Mammogram  03/07/2010  . Shingles Vaccine  12/27/2012  . Flu Shot  04/12/2015*  . Pap Smear  03/07/2016  . Colon Cancer Screening  03/07/2021  *Topic was postponed. The date shown is not the original due date.     Fall Prevention and Home Safety Falls cause injuries and can affect all age groups. It is possible to prevent falls.  HOW TO PREVENT FALLS  Wear shoes with rubber soles that do not have an opening for your toes.  Keep the inside and outside of your house well lit.  Use night lights throughout your home.  Remove clutter from floors.  Clean up floor spills.  Remove throw rugs or fasten them to the floor with carpet tape.  Do not place electrical cords across pathways.  Put grab bars by your tub, shower, and toilet. Do not use towel bars as grab bars.  Put handrails on both sides of the stairway. Fix loose handrails.  Do not climb on stools or stepladders, if possible.  Do not wax your floors.  Repair uneven or unsafe sidewalks, walkways, or stairs.  Keep items you use a lot within reach.  Be aware of pets.  Keep emergency numbers next to the telephone.  Put smoke detectors in your home and near bedrooms. Ask your doctor what other things you can do to prevent falls. Document Released: 12/18/2008 Document Revised: 08/23/2011 Document Reviewed: 05/24/2011 [facility] Patient  Information 2015 [place], [place]. This information is not intended to replace advice given to you by your health care provider. Make sure you discuss any questions you have with your health care provider.  Health Maintenance Adopting a healthy lifestyle and getting preventive care can go a long way to promote health and wellness. Talk with your health care provider about what schedule of regular examinations is right for you. This is a good chance for you to check in with your provider about disease prevention and staying healthy. In between checkups, there are plenty of things you can do on your own. Experts have done a lot of research about which lifestyle changes and preventive measures are most likely to keep you healthy. Ask your health care provider for more information. WEIGHT AND DIET  Eat a healthy diet  Be sure to include plenty of vegetables, fruits, low-fat dairy products, and lean protein.  Do not eat a lot of foods high in solid fats, added sugars, or salt.  Get regular exercise. This is one of the most important things you can do for your health.  Most adults should exercise for at least 150 minutes each week. The exercise should increase your heart rate and make you sweat (moderate-intensity exercise).  Most adults should also do strengthening exercises at least twice a week. This is in addition to the moderate-intensity exercise.  Maintain a healthy  weight  Body mass index (BMI) is a measurement that can be used to identify possible weight problems. It estimates body fat based on height and weight. Your health care provider can help determine your BMI and help you achieve or maintain a healthy weight.  For females 68 years of age and older:   A BMI below 18.5 is considered underweight.  A BMI of 18.5 to 24.9 is normal.  A BMI of 25 to 29.9 is considered overweight.  A BMI of 30 and above is considered obese.  Watch levels of cholesterol and blood lipids  You should  start having your blood tested for lipids and cholesterol at 62 years of age, then have this test every 5 years.  You may need to have your cholesterol levels checked more often if:  Your lipid or cholesterol levels are high.  You are older than 62 years of age.  You are at high risk for heart disease.  CANCER SCREENING   Lung Cancer  Lung cancer screening is recommended for adults 4-79 years old who are at high risk for lung cancer because of a history of smoking.  A yearly low-dose CT scan of the lungs is recommended for people who:  Currently smoke.  Have quit within the past 15 years.  Have at least a 30-pack-year history of smoking. A pack year is smoking an average of one pack of cigarettes a day for 1 year.  Yearly screening should continue until it has been 15 years since you quit.  Yearly screening should stop if you develop a health problem that would prevent you from having lung cancer treatment.  Breast Cancer  Practice breast self-awareness. This means understanding how your breasts normally appear and feel.  It also means doing regular breast self-exams. Let your health care provider know about any changes, no matter how small.  If you are in your 20s or 30s, you should have a clinical breast exam (CBE) by a health care provider every 1-3 years as part of a regular health exam.  If you are 39 or older, have a CBE every year. Also consider having a breast X-ray (mammogram) every year.  If you have a family history of breast cancer, talk to your health care provider about genetic screening.  If you are at high risk for breast cancer, talk to your health care provider about having an MRI and a mammogram every year.  Breast cancer gene (BRCA) assessment is recommended for women who have family members with BRCA-related cancers. BRCA-related cancers include:  Breast.  Ovarian.  Tubal.  Peritoneal cancers.  Results of the assessment will determine the  need for genetic counseling and BRCA1 and BRCA2 testing. Cervical Cancer Routine pelvic examinations to screen for cervical cancer are no longer recommended for nonpregnant women who are considered low risk for cancer of the pelvic organs (ovaries, uterus, and vagina) and who do not have symptoms. A pelvic examination may be necessary if you have symptoms including those associated with pelvic infections. Ask your health care provider if a screening pelvic exam is right for you.   The Pap test is the screening test for cervical cancer for women who are considered at risk.  If you had a hysterectomy for a problem that was not cancer or a condition that could lead to cancer, then you no longer need Pap tests.  If you are older than 65 years, and you have had normal Pap tests for the past 10 years, you no  longer need to have Pap tests.  If you have had past treatment for cervical cancer or a condition that could lead to cancer, you need Pap tests and screening for cancer for at least 20 years after your treatment.  If you no longer get a Pap test, assess your risk factors if they change (such as having a new sexual partner). This can affect whether you should start being screened again.  Some women have medical problems that increase their chance of getting cervical cancer. If this is the case for you, your health care provider may recommend more frequent screening and Pap tests.  The human papillomavirus (HPV) test is another test that may be used for cervical cancer screening. The HPV test looks for the virus that can cause cell changes in the cervix. The cells collected during the Pap test can be tested for HPV.  The HPV test can be used to screen women 66 years of age and older. Getting tested for HPV can extend the interval between normal Pap tests from three to five years.  An HPV test also should be used to screen women of any age who have unclear Pap test results.  After 62 years of age,  women should have HPV testing as often as Pap tests.  Colorectal Cancer  This type of cancer can be detected and often prevented.  Routine colorectal cancer screening usually begins at 62 years of age and continues through 62 years of age.  Your health care provider may recommend screening at an earlier age if you have risk factors for colon cancer.  Your health care provider may also recommend using home test kits to check for hidden blood in the stool.  A small camera at the end of a tube can be used to examine your colon directly (sigmoidoscopy or colonoscopy). This is done to check for the earliest forms of colorectal cancer.  Routine screening usually begins at age 38.  Direct examination of the colon should be repeated every 5-10 years through 62 years of age. However, you may need to be screened more often if early forms of precancerous polyps or small growths are found. Skin Cancer  Check your skin from head to toe regularly.  Tell your health care provider about any new moles or changes in moles, especially if there is a change in a mole's shape or color.  Also tell your health care provider if you have a mole that is larger than the size of a pencil eraser.  Always use sunscreen. Apply sunscreen liberally and repeatedly throughout the day.  Protect yourself by wearing long sleeves, pants, a wide-brimmed hat, and sunglasses whenever you are outside. HEART DISEASE, DIABETES, AND HIGH BLOOD PRESSURE   Have your blood pressure checked at least every 1-2 years. High blood pressure causes heart disease and increases the risk of stroke.  If you are between 77 years and 79 years old, ask your health care provider if you should take aspirin to prevent strokes.  Have regular diabetes screenings. This involves taking a blood sample to check your fasting blood sugar level.  If you are at a normal weight and have a low risk for diabetes, have this test once every three years after 62  years of age.  If you are overweight and have a high risk for diabetes, consider being tested at a younger age or more often. PREVENTING INFECTION  Hepatitis B  If you have a higher risk for hepatitis B, you should be  screened for this virus. You are considered at high risk for hepatitis B if:  You were born in a country where hepatitis B is common. Ask your health care provider which countries are considered high risk.  Your parents were born in a high-risk country, and you have not been immunized against hepatitis B (hepatitis B vaccine).  You have HIV or AIDS.  You use needles to inject street drugs.  You live with someone who has hepatitis B.  You have had sex with someone who has hepatitis B.  You get hemodialysis treatment.  You take certain medicines for conditions, including cancer, organ transplantation, and autoimmune conditions. Hepatitis C  Blood testing is recommended for:  Everyone born from [birth year] through 1963.  Anyone with known risk factors for hepatitis C. Sexually transmitted infections (STIs)  You should be screened for sexually transmitted infections (STIs) including gonorrhea and chlamydia if:  You are sexually active and are younger than 62 years of age.  You are older than 62 years of age and your health care provider tells you that you are at risk for this type of infection.  Your sexual activity has changed since you were last screened and you are at an increased risk for chlamydia or gonorrhea. Ask your health care provider if you are at risk.  If you do not have HIV, but are at risk, it may be recommended that you take a prescription medicine daily to prevent HIV infection. This is called pre-exposure prophylaxis (PrEP). You are considered at risk if:  You are sexually active and do not regularly use condoms or know the HIV status of your partner(s).  You take drugs by injection.  You are sexually active with a partner who has HIV. Talk with  your health care provider about whether you are at high risk of being infected with HIV. If you choose to begin PrEP, you should first be tested for HIV. You should then be tested every 3 months for as long as you are taking PrEP.  PREGNANCY   If you are premenopausal and you may become pregnant, ask your health care provider about preconception counseling.  If you may become pregnant, take 400 to 800 micrograms (mcg) of folic acid every day.  If you want to prevent pregnancy, talk to your health care provider about birth control (contraception). OSTEOPOROSIS AND MENOPAUSE   Osteoporosis is a disease in which the bones lose minerals and strength with aging. This can result in serious bone fractures. Your risk for osteoporosis can be identified using a bone density scan.  If you are 44 years of age or older, or if you are at risk for osteoporosis and fractures, ask your health care provider if you should be screened.  Ask your health care provider whether you should take a calcium or vitamin D supplement to lower your risk for osteoporosis.  Menopause may have certain physical symptoms and risks.  Hormone replacement therapy may reduce some of these symptoms and risks. Talk to your health care provider about whether hormone replacement therapy is right for you.  HOME CARE INSTRUCTIONS   Schedule regular health, dental, and eye exams.  Stay current with your immunizations.   Do not use any tobacco products including cigarettes, chewing tobacco, or electronic cigarettes.  If you are pregnant, do not drink alcohol.  If you are breastfeeding, limit how much and how often you drink alcohol.  Limit alcohol intake to no more than 1 drink per day for nonpregnant women.  One drink equals 12 ounces of beer, 5 ounces of wine, or 1 ounces of hard liquor.  Do not use street drugs.  Do not share needles.  Ask your health care provider for help if you need support or information about quitting  drugs.  Tell your health care provider if you often feel depressed.  Tell your health care provider if you have ever been abused or do not feel safe at home. Document Released: 09/06/2010 Document Revised: 07/08/2013 Document Reviewed: 01/23/2013 [facility] Patient Information 2015 [place], [place]. This information is not intended to replace advice given to you by your health care provider. Make sure you discuss any questions you have with your health care provider.

## 2014-09-17 ENCOUNTER — Telehealth: Payer: Self-pay

## 2014-09-17 ENCOUNTER — Encounter: Payer: Self-pay | Admitting: Internal Medicine

## 2014-09-17 NOTE — Progress Notes (Signed)
   Subjective:    Patient ID: Caitlyn Bautista, female    DOB: 1950-11-02, 64 y.o.   MRN: [id number]  HPI The patient is a 64 YO female seen for worsening of her chronic pain and fibromyalgia. She is taking cymbalta which helps some but not that much. She had been on lyrica in the past and got a rash on her face with some blisters. No airway changes or tongue swelling. She had been on it and it worked well for her pain for 6-7 years. This pain is taking away her QOL and she is not able to leave her home or do much within her home. Pain in her shoulders and neck as well as low back and into her buttock. She also wants to know if she could get cortisone shots in her back as she has gotten them in the past in [facility].  Review of Systems  Constitutional: Positive for activity change. Negative for fever, chills, appetite change and unexpected weight change.  Respiratory: Negative for cough, chest tightness and wheezing.   Cardiovascular: Negative for chest pain, palpitations and leg swelling.  Gastrointestinal: Negative for abdominal pain, diarrhea, constipation and abdominal distention.  Musculoskeletal: Positive for myalgias, back pain, arthralgias and gait problem.  Skin: Negative.   Neurological: Negative.       Objective:   Physical Exam  Constitutional: She is oriented to person, place, and time. She appears well-developed and well-nourished.  Overweight  HENT:  Head: Normocephalic and atraumatic.  Eyes: EOM are normal.  Neck: Normal range of motion.  Cardiovascular: Normal rate and regular rhythm.   Pulmonary/Chest: Effort normal and breath sounds normal. No respiratory distress. She has no wheezes. She has no rales.  Abdominal: Soft. Bowel sounds are normal. She exhibits no distension. There is no tenderness.  Musculoskeletal: She exhibits tenderness. She exhibits no edema.  Muscular pain in the shoulders and neck, mild tenderness in the lumbar region with radiation to both buttock    Neurological: She is alert and oriented to person, place, and time. Coordination normal.  Skin: Skin is warm and dry.  Psychiatric: She has a normal mood and affect.   Filed Vitals:   09/16/14 1406  BP: 128/80  Pulse: 74  Temp: 98.4 F (36.9 C)  TempSrc: Oral  Height: 5\' 7"  (1.702 m)  Weight: 277 lb 8 oz (125.873 kg)  SpO2: 99%      Assessment & Plan:

## 2014-09-17 NOTE — Progress Notes (Signed)
Medical screening examination/treatment/procedure(s) were performed by non-physician practitioner and as supervising physician I was immediately available for consultation/collaboration. I agree with above. Kollar, Daijha Leggio A, MD   

## 2014-09-17 NOTE — Assessment & Plan Note (Signed)
Referral to orthopedics for possible injection versus other appropriate intervention. Talked to her about PT and she does not feel able to try that at all right now.

## 2014-09-17 NOTE — Assessment & Plan Note (Signed)
Will retry lyrica as she has done well with it in the past. Start with 75 mg BID and adjust as needed.

## 2014-09-17 NOTE — Telephone Encounter (Signed)
Call to the Dental clinic at [facility] in [facility]; no [phone] and was told they only provider dental services for children and pregnant women. Left message with the dental clinic for any other providers that would negotiate fees or provider services for medicare patients.

## 2014-10-14 ENCOUNTER — Other Ambulatory Visit: Payer: Self-pay | Admitting: Sports Medicine

## 2014-10-14 DIAGNOSIS — M545 Low back pain: Secondary | ICD-10-CM

## 2014-10-27 ENCOUNTER — Other Ambulatory Visit: Payer: Self-pay

## 2014-11-04 ENCOUNTER — Other Ambulatory Visit (HOSPITAL_COMMUNITY): Payer: Self-pay | Admitting: Internal Medicine

## 2014-11-04 DIAGNOSIS — Z1231 Encounter for screening mammogram for malignant neoplasm of breast: Secondary | ICD-10-CM

## 2014-11-11 ENCOUNTER — Ambulatory Visit
Admission: RE | Admit: 2014-11-11 | Discharge: 2014-11-11 | Disposition: A | Discharge status: Home / Self Care | Payer: Medicare Other | Source: Ambulatory Visit | Attending: Sports Medicine | Admitting: Sports Medicine

## 2014-11-11 DIAGNOSIS — M545 Low back pain: Secondary | ICD-10-CM

## 2014-11-18 ENCOUNTER — Ambulatory Visit (HOSPITAL_COMMUNITY)
Admission: RE | Admit: 2014-11-18 | Discharge: 2014-11-18 | Disposition: A | Discharge status: Home / Self Care | Payer: Medicare Other | Source: Ambulatory Visit | Attending: Internal Medicine | Admitting: Internal Medicine

## 2014-11-18 DIAGNOSIS — Z1231 Encounter for screening mammogram for malignant neoplasm of breast: Secondary | ICD-10-CM | POA: Insufficient documentation

## 2014-11-18 DIAGNOSIS — R921 Mammographic calcification found on diagnostic imaging of breast: Secondary | ICD-10-CM | POA: Insufficient documentation

## 2014-11-19 ENCOUNTER — Other Ambulatory Visit: Payer: Self-pay | Admitting: Internal Medicine

## 2014-11-19 DIAGNOSIS — R928 Other abnormal and inconclusive findings on diagnostic imaging of breast: Secondary | ICD-10-CM

## 2014-11-26 ENCOUNTER — Other Ambulatory Visit: Payer: Self-pay | Admitting: Internal Medicine

## 2014-11-26 ENCOUNTER — Ambulatory Visit
Admission: RE | Admit: 2014-11-26 | Discharge: 2014-11-26 | Disposition: A | Discharge status: Home / Self Care | Payer: Medicare Other | Source: Ambulatory Visit | Attending: Internal Medicine | Admitting: Internal Medicine

## 2014-11-26 DIAGNOSIS — R928 Other abnormal and inconclusive findings on diagnostic imaging of breast: Secondary | ICD-10-CM

## 2014-12-02 ENCOUNTER — Other Ambulatory Visit: Payer: Self-pay | Admitting: Internal Medicine

## 2014-12-02 ENCOUNTER — Ambulatory Visit
Admission: RE | Admit: 2014-12-02 | Discharge: 2014-12-02 | Disposition: A | Discharge status: Home / Self Care | Payer: Medicare Other | Source: Ambulatory Visit | Attending: Internal Medicine | Admitting: Internal Medicine

## 2014-12-02 DIAGNOSIS — R928 Other abnormal and inconclusive findings on diagnostic imaging of breast: Secondary | ICD-10-CM

## 2014-12-05 ENCOUNTER — Telehealth: Payer: Self-pay | Admitting: *Deleted

## 2014-12-05 ENCOUNTER — Encounter: Payer: Self-pay | Admitting: *Deleted

## 2014-12-05 DIAGNOSIS — C50319 Malignant neoplasm of lower-inner quadrant of unspecified female breast: Secondary | ICD-10-CM

## 2014-12-05 DIAGNOSIS — C50312 Malignant neoplasm of lower-inner quadrant of left female breast: Secondary | ICD-10-CM | POA: Insufficient documentation

## 2014-12-05 HISTORY — DX: Malignant neoplasm of lower-inner quadrant of unspecified female breast: C50.319

## 2014-12-05 NOTE — Telephone Encounter (Signed)
Confirmed [facility] for 12/10/14 at 1230.  Instructions and contact information given.

## 2014-12-10 ENCOUNTER — Encounter: Payer: Self-pay | Admitting: Nurse Practitioner

## 2014-12-10 ENCOUNTER — Ambulatory Visit
Admission: RE | Admit: 2014-12-10 | Discharge: 2014-12-10 | Disposition: A | Discharge status: Home / Self Care | Payer: Medicare Other | Source: Ambulatory Visit | Attending: Radiation Oncology | Admitting: Radiation Oncology

## 2014-12-10 ENCOUNTER — Ambulatory Visit (HOSPITAL_BASED_OUTPATIENT_CLINIC_OR_DEPARTMENT_OTHER): Payer: Medicare Other | Admitting: Hematology and Oncology

## 2014-12-10 ENCOUNTER — Encounter: Payer: Self-pay | Admitting: Hematology and Oncology

## 2014-12-10 ENCOUNTER — Other Ambulatory Visit (HOSPITAL_BASED_OUTPATIENT_CLINIC_OR_DEPARTMENT_OTHER): Payer: Medicare Other

## 2014-12-10 ENCOUNTER — Ambulatory Visit: Payer: Medicare Other | Admitting: Physical Therapy

## 2014-12-10 VITALS — BP 157/99 | HR 77 | Temp 98.4°F | Resp 20 | Ht 67.0 in | Wt 282.1 lb

## 2014-12-10 DIAGNOSIS — F329 Major depressive disorder, single episode, unspecified: Secondary | ICD-10-CM

## 2014-12-10 DIAGNOSIS — C50312 Malignant neoplasm of lower-inner quadrant of left female breast: Secondary | ICD-10-CM

## 2014-12-10 DIAGNOSIS — D0512 Intraductal carcinoma in situ of left breast: Secondary | ICD-10-CM | POA: Diagnosis not present

## 2014-12-10 DIAGNOSIS — F319 Bipolar disorder, unspecified: Secondary | ICD-10-CM

## 2014-12-10 LAB — COMPREHENSIVE METABOLIC PANEL (CC13)
ALT: 14 U/L (ref 0–55)
ANION GAP: 6 meq/L (ref 3–11)
AST: 14 U/L (ref 5–34)
Albumin: 3.4 g/dL — ABNORMAL LOW (ref 3.5–5.0)
Alkaline Phosphatase: 115 U/L (ref 40–150)
BUN: 12.1 mg/dL (ref 7.0–26.0)
CALCIUM: 8.8 mg/dL (ref 8.4–10.4)
CHLORIDE: 111 meq/L — AB (ref 98–109)
CO2: 27 mEq/L (ref 22–29)
CREATININE: 0.8 mg/dL (ref 0.6–1.1)
Glucose: 86 mg/dl (ref 70–140)
POTASSIUM: 3.5 meq/L (ref 3.5–5.1)
Sodium: 143 mEq/L (ref 136–145)
Total Bilirubin: <0.3 mg/dL (ref 0.20–1.20)
Total Protein: 7.4 g/dL (ref 6.4–8.3)

## 2014-12-10 LAB — CBC WITH DIFFERENTIAL/PLATELET
BASO%: 0.8 % (ref 0.0–2.0)
BASOS ABS: 0 10*3/uL (ref 0.0–0.1)
EOS ABS: 0.2 10*3/uL (ref 0.0–0.5)
EOS%: 3.5 % (ref 0.0–7.0)
HEMATOCRIT: 34.2 % — AB (ref 34.8–46.6)
HGB: 10.8 g/dL — ABNORMAL LOW (ref 11.6–15.9)
LYMPH#: 2 10*3/uL (ref 0.9–3.3)
LYMPH%: 35.5 % (ref 14.0–49.7)
MCH: 26.9 pg (ref 25.1–34.0)
MCHC: 31.6 g/dL (ref 31.5–36.0)
MCV: 85 fL (ref 79.5–101.0)
MONO#: 0.5 10*3/uL (ref 0.1–0.9)
MONO%: 9.2 % (ref 0.0–14.0)
NEUT#: 2.9 10*3/uL (ref 1.5–6.5)
NEUT%: 51 % (ref 38.4–76.8)
PLATELETS: 344 10*3/uL (ref 145–400)
RBC: 4.02 10*6/uL (ref 3.70–5.45)
RDW: 15.1 % — ABNORMAL HIGH (ref 11.2–14.5)
WBC: 5.6 10*3/uL (ref 3.9–10.3)

## 2014-12-10 NOTE — Progress Notes (Signed)
[facility] NEW PATIENT EVALUATION  Name: Caitlyn Bautista MRN: [id number]  Date:   12/10/2014           DOB: [birth year]  Status: outpatient   CC: Hoyt Koch, MD  Dr. [name]    REFERRING PHYSICIAN: Dr. 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 female who is Seen today at the breast multidisciplinary clinic through the courtesy of Dr. Marlou Starks for evaluation of her DCIS of the left breast.  At the time of a screening mammogram on 11/18/2014 she was found to have a possible left breast mass with calcifications.  Additional views on September 21 showed calcifications over an area of 5 mm within the lower inner quadrant of the left breast at approximately 8:00.  A biopsy on 12/02/2014 was diagnostic for intermediate grade DCIS with calcifications.  Her DCIS is ER and PR positive, both at 100%.  She is seen today with Dr. Marlou Starks and Dr. Lindi Adie.   PREVIOUS RADIATION THERAPY: No   PAST MEDICAL HISTORY:  has a past medical history of PTSD (post-traumatic stress disorder); Anxiety; Fibromyalgia; Bipolar disorder ([place]); Arthritis; Hypertension; Thyroid disease; Carpal tunnel syndrome, bilateral; Asthma; History of chicken pox; Depression; Hyperlipidemia; Incontinence of urine in female; Arachnoiditis; Cancer of lower-inner quadrant of female breast ([place]) (12/05/2014); Carpal tunnel syndrome; Scoliosis; and Allergy.     PAST SURGICAL HISTORY:  Past Surgical History  Procedure Laterality Date  . Tubal ligation    . Breast lumpectomy  1970's  . Abdominal hysterectomy  1990's  . Dilation and curettage of uterus       FAMILY HISTORY: family history includes Arthritis in her other, other, and other; Breast cancer in her other; Cancer in her other and other; Diabetes in her other; Early death in her other; Hypertension in her other, other, and other. a maternal aunt was diagnosed  with breast cancer in her 59s, and again her in her 58s.  She died of metastatic breast cancer in her late 71s.  She tells me that there is a history of ovarian cancer in her family as well.   SOCIAL HISTORY:  reports that she has never smoked. She has never used smokeless tobacco. She reports that she drinks alcohol. She reports that she does not use illicit drugs.  Technically married, but single.  She has 4 children.  She is on disability for medical and psychiatric reasons.   ALLERGIES: Ace inhibitors; Decongestant; Nsaids; and Other   MEDICATIONS:  Current Outpatient Prescriptions  Medication Sig Dispense Refill  . albuterol (PROVENTIL HFA;VENTOLIN HFA) 108 (90 BASE) MCG/ACT inhaler Inhale 2 puffs into the lungs daily. For wheezing. 1 Inhaler 0  . amLODipine (NORVASC) 10 MG tablet Take 1 tablet (10 mg total) by mouth daily. For hypertension. 90 tablet 3  . buPROPion (WELLBUTRIN XL) 150 MG 24 hr tablet Take 1 tablet (150 mg total) by mouth daily. 30 tablet 0  . cholecalciferol (VITAMIN D) 1000 UNITS tablet Take 1 tablet (1,000 Units total) by mouth daily. (Patient not taking: Reported on 12/10/2014)    . diazepam (VALIUM) 5 MG tablet Take 1 tablet (5 mg total) by mouth 3 (three) times daily as needed for anxiety. (Patient not taking: Reported on 12/10/2014) 30 tablet 0  . doxepin (SINEQUAN) 25 MG capsule Take one capsule at bedtime if needed for insomnia. (Patient taking differently: Take  100 mg at bedtime if needed for insomnia.) 30 capsule 0  . DULoxetine (CYMBALTA) 60 MG capsule Take 60 mg by mouth daily. Take 2 in the AM and 3 at bedtime    . fexofenadine (ALLEGRA) 180 MG tablet Take 1 tablet (180 mg total) by mouth daily. For seasonal allergies.    . fluticasone (FLONASE) 50 MCG/ACT nasal spray Place 2 sprays into both nostrils daily. For seasonal allergies. 16 g 0  . HYDROcodone-acetaminophen (NORCO) 10-325 MG per tablet Take 1 tablet by mouth every 8 (eight) hours as needed. 90 tablet 0   . lithium carbonate 300 MG capsule Take two capsules (600mg ) by mouth with breakfast. Take 3 capsules (900mg ) with dinner for mood stabilization. 150 capsule 0  . mineral oil liquid Take 15 mLs by mouth daily as needed for moderate constipation.    . pregabalin (LYRICA) 75 MG capsule Take 1 capsule (75 mg total) by mouth 2 (two) times daily. (Patient not taking: Reported on 12/10/2014) 60 capsule 2  . ranitidine (ZANTAC) 75 MG tablet Take 1 tablet (75 mg total) by mouth 2 (two) times daily. (Patient not taking: Reported on 12/10/2014)     No current facility-administered medications for this encounter.     REVIEW OF SYSTEMS:  Pertinent items are noted in HPI.    PHYSICAL EXAM: Alert and oriented 64 year old African-American female appearing slightly younger than her stated age. Wt Readings from Last 3 Encounters:  12/10/14 282 lb 1.6 oz (127.96 kg)  09/16/14 277 lb 8 oz (125.873 kg)  04/11/14 283 lb 1.9 oz (128.422 kg)   Temp Readings from Last 3 Encounters:  12/10/14 98.4 F (36.9 C) Oral  09/16/14 98.4 F (36.9 C) Oral  04/11/14 98.3 F (36.8 C) Oral   BP Readings from Last 3 Encounters:  12/10/14 157/99  09/16/14 128/80  04/11/14 130/92   Pulse Readings from Last 3 Encounters:  12/10/14 77  09/16/14 74  04/11/14 83   Nodes: There is no palpable cervical, supraclavicular, or axillary lymphadenopathy.  Breasts: She is large breasted.  There is a punctate biopsy wound at approximately 8:00 along the left breast.  No masses are appreciated.  Right breast without masses or lesions.  Extremities: Without upper extremity lymphedema.    LABORATORY DATA:  Lab Results  Component Value Date   WBC 5.6 12/10/2014   HGB 10.8* 12/10/2014   HCT 34.2* 12/10/2014   MCV 85.0 12/10/2014   PLT 344 12/10/2014   Lab Results  Component Value Date   NA 143 12/10/2014   K 3.5 12/10/2014   CL 108 04/11/2014   CO2 27 12/10/2014   Lab Results  Component Value Date   ALT 14 12/10/2014    AST 14 12/10/2014   ALKPHOS 115 12/10/2014   BILITOT <0.30 12/10/2014      IMPRESSION: Clinical stage 0 (Tis N0 M0) DCIS of the left breast.  I explained to the patient and her family that her local treatment options include the partial mastectomy or mastectomy.  With a partial mastectomy, we typically offer adjuvant radiation therapy.  She does have significant medical comorbidities, and we can decide whether not to offer her radiation therapy after her definitive surgery we can determine her risk for recurrent disease.  We discussed the potential acute and late toxicities of radiation therapy.  I spoke with Dr. Lindi Adie and he tells me that she may not want to take anti-estrogen pill.  There may be compliance issues.  If she receives radiation therapy she  would require standard fractionation based on her breast size.  She may be a candidate for deep inspiration breath-hold to avoid cardiac irradiation.  Her prognosis is excellent.  PLAN: As discussed above.   I spent 30 minutes minutes face to face with the patient and more than 50% of that time was spent in counseling and/or coordination of care.

## 2014-12-10 NOTE — Progress Notes (Signed)
Ms. Hainline is a very pleasant 64 y.o. female from [place], [place] with newly diagnosed grade 2 ductal carcinoma in situ of the left breast.  Biopsy results revealed the tumor's prognostic profile is ER positive and PR positive.  She presents today with her sister to the [facility]) for treatment consideration and recommendations from the breast surgeon, radiation oncologist, and medical oncologist.     I briefly met with Ms. Horwitz and her sister during her [facility] visit today. We discussed the purpose of the Survivorship Clinic, which will include monitoring for recurrence, coordinating completion of age and gender-appropriate cancer screenings, promotion of overall wellness, as well as managing potential late/long-term side effects of anti-cancer treatments.    The treatment plan for Ms. Malachi will likely include surgery,radiation therapy, and anti-estrogen therapy.  She will meet with the Genetics Counselor due to her family history of breast cancer. As of today, the intent of treatment for Ms. Andy is cure, therefore she will be eligible for the Survivorship Clinic upon her completion of treatment.  Her survivorship care plan (SCP) document will be drafted and updated throughout the course of her treatment trajectory. She will receive the SCP in an office visit with myself in the Survivorship Clinic once she has completed treatment.   Ms. Gao was encouraged to ask questions and all questions were answered to her satisfaction.  She was given my business card and encouraged to contact me with any concerns regarding survivorship.  I look forward to participating in her care.   Kenn File, [place] [phone]

## 2014-12-10 NOTE — Assessment & Plan Note (Signed)
Left breast DCIS low to intermediate grade with calcifications ER 100%, PR 100% TisN0 stage 0, spanning 5 mm  Pathology review: I discussed with the patient the difference between DCIS and invasive breast cancer. It is considered a precancerous lesion. DCIS is classified as a 0. It is generally detected through mammograms as calcifications. We discussed the significance of grades and its impact on prognosis. We also discussed the importance of ER and PR receptors and their implications to adjuvant treatment options. Prognosis of DCIS dependence on grade, comedo necrosis. It is anticipated that if not treated, 20-30% of DCIS can develop into invasive breast cancer.  Recommendation: 1. Breast conserving surgery 2. Followed by adjuvant radiation therapy 3. Followed by antiestrogen therapy with tamoxifen 5 years  Tamoxifen counseling: We discussed the risks and benefits of tamoxifen. These include but not limited to insomnia, hot flashes, mood changes, vaginal dryness, and weight gain. Although rare, serious side effects including endometrial cancer, risk of blood clots were also discussed. We strongly believe that the benefits far outweigh the risks. Patient understands these risks and consented to starting treatment. Planned treatment duration is 5 years.  Return to clinic after surgery to discuss the final pathology report and come up with an adjuvant treatment plan.

## 2014-12-10 NOTE — Progress Notes (Signed)
[facility] NOTE  Patient Care Team: Hoyt Koch, MD as PCP - General (Internal Medicine) [name], MD as Consulting Physician (General Surgery) Nicholas Lose, MD as Consulting Physician (Hematology and Oncology) Arloa Koh, MD as Consulting Physician (Radiation Oncology) Mauro Kaufmann, RN as Registered Nurse Rockwell Germany, RN as Registered Nurse  CHIEF COMPLAINTS/PURPOSE OF CONSULTATION:  Newly diagnosed breast cancer  HISTORY OF PRESENTING ILLNESS:  Caitlyn Bautista 64 y.o. female is here because of recent diagnosis of Left breast DCIS. She has a personal history of a benign breast biopsy in the right breast 1975. She had a screening mammogram detected left breast calcifications along with the mass. Around 8:00 position there were 5 mm span of calcifications. This was biopsied and came back as low to intermediate grade DCIS with calcifications. ER/PR 100% positive. She was presented at the  Multidisciplinary tumor board and she is here today to discuss a treatment plan. She has extensive past medical problems significant for bipolar disorder, depression, fibromyalgia and PTSD.  I reviewed her records extensively and collaborated the history with the patient.  SUMMARY OF ONCOLOGIC HISTORY:   Primary cancer of lower-inner quadrant of left female breast ([place])   11/26/2014 Mammogram Left mammogram 8:00 calcifications spanning 5 mm   12/02/2014 Initial Diagnosis Left breast biopsy: Low to intermediate grade DCIS with calcifications, ER 100%, PR 100%    In terms of breast cancer risk profile:  She menarched at early age of 70 years and had hysterectomy but most likely she went into menopause 5 years ago  She had 4 pregnancy, her first child was born at age 40  She has received birth control pills for approximately 1-2 years.  She was never exposed to fertility medications or hormone replacement therapy.  She has no family history of Breast/GYN/GI  cancer  MEDICAL HISTORY:  Past Medical History  Diagnosis Date  . PTSD (post-traumatic stress disorder)   . Anxiety   . Fibromyalgia   . Bipolar disorder ([place])   . Arthritis   . Hypertension   . Thyroid disease   . Carpal tunnel syndrome, bilateral   . Asthma   . History of chicken pox   . Depression   . Hyperlipidemia   . Incontinence of urine in female   . Arachnoiditis   . Cancer of lower-inner quadrant of female breast ([place]) 12/05/2014  . Carpal tunnel syndrome   . Scoliosis   . Allergy     SURGICAL HISTORY: Past Surgical History  Procedure Laterality Date  . Tubal ligation    . Breast lumpectomy  1970's  . Abdominal hysterectomy  1990's  . Dilation and curettage of uterus      SOCIAL HISTORY: Social History   Social History  . Marital Status: Single    Spouse Name: N/A  . Number of Children: 4  . Years of Education: 11   Occupational History  . Disabled    Social History Main Topics  . Smoking status: Never Smoker   . Smokeless tobacco: Never Used  . Alcohol Use: Yes     Comment: Rarely  . Drug Use: No  . Sexual Activity: No   Other Topics Concern  . Not on file   Social History Narrative   Regular exercise-no   Caffeine Use-yes    FAMILY HISTORY: Family History  Problem Relation Age of Onset  . Arthritis Other     Parent  . Hypertension Other     Parent  .  Early death Other     Parent  . Arthritis Other     Grandparent  . Cancer Other     Ovarian/Uterine Cancer-Grandmother  . Hypertension Other     Grandparent  . Arthritis Other     Other Blood Relative  . Cancer Other     Ovarian/Uterine Cancer-Other Blood Relative  . Breast cancer Other     Other Blood Relative  . Hypertension Other     Other Blood Relative  . Diabetes Other     Other Blood Relative    ALLERGIES:  is allergic to ace inhibitors; decongestant; nsaids; and other.  MEDICATIONS:  Current Outpatient Prescriptions  Medication Sig Dispense Refill  .  albuterol (PROVENTIL HFA;VENTOLIN HFA) 108 (90 BASE) MCG/ACT inhaler Inhale 2 puffs into the lungs daily. For wheezing. 1 Inhaler 0  . amLODipine (NORVASC) 10 MG tablet Take 1 tablet (10 mg total) by mouth daily. For hypertension. 90 tablet 3  . buPROPion (WELLBUTRIN XL) 150 MG 24 hr tablet Take 1 tablet (150 mg total) by mouth daily. 30 tablet 0  . doxepin (SINEQUAN) 25 MG capsule Take one capsule at bedtime if needed for insomnia. (Patient taking differently: Take 100 mg at bedtime if needed for insomnia.) 30 capsule 0  . DULoxetine (CYMBALTA) 60 MG capsule Take 60 mg by mouth daily. Take 2 in the AM and 3 at bedtime    . fexofenadine (ALLEGRA) 180 MG tablet Take 1 tablet (180 mg total) by mouth daily. For seasonal allergies.    . fluticasone (FLONASE) 50 MCG/ACT nasal spray Place 2 sprays into both nostrils daily. For seasonal allergies. 16 g 0  . HYDROcodone-acetaminophen (NORCO) 10-325 MG per tablet Take 1 tablet by mouth every 8 (eight) hours as needed. 90 tablet 0  . lithium carbonate 300 MG capsule Take two capsules (600mg ) by mouth with breakfast. Take 3 capsules (900mg ) with dinner for mood stabilization. 150 capsule 0  . mineral oil liquid Take 15 mLs by mouth daily as needed for moderate constipation.    . cholecalciferol (VITAMIN D) 1000 UNITS tablet Take 1 tablet (1,000 Units total) by mouth daily. (Patient not taking: Reported on 12/10/2014)    . diazepam (VALIUM) 5 MG tablet Take 1 tablet (5 mg total) by mouth 3 (three) times daily as needed for anxiety. (Patient not taking: Reported on 12/10/2014) 30 tablet 0  . pregabalin (LYRICA) 75 MG capsule Take 1 capsule (75 mg total) by mouth 2 (two) times daily. (Patient not taking: Reported on 12/10/2014) 60 capsule 2  . ranitidine (ZANTAC) 75 MG tablet Take 1 tablet (75 mg total) by mouth 2 (two) times daily. (Patient not taking: Reported on 12/10/2014)     No current facility-administered medications for this visit.    REVIEW OF SYSTEMS:    Constitutional: Denies fevers, chills or abnormal night sweats Eyes: Denies blurriness of vision, double vision or watery eyes Ears, nose, mouth, throat, and face: Denies mucositis or sore throat Respiratory: Denies cough, dyspnea or wheezes Cardiovascular: Denies palpitation, chest discomfort or lower extremity swelling Gastrointestinal:  Denies nausea, heartburn or change in bowel habits Skin: Denies abnormal skin rashes Lymphatics: Denies new lymphadenopathy or easy bruising Neurological:Denies numbness, tingling or new weaknesses Behavioral/Psych: Mood is stable, no new changes  Breast:  Denies any palpable lumps or discharge All other systems were reviewed with the patient and are negative.  PHYSICAL EXAMINATION: ECOG PERFORMANCE STATUS: 1 - Symptomatic but completely ambulatory  Filed Vitals:   12/10/14 1309  BP: 157/99  Pulse: 77  Temp: 98.4 F (36.9 C)  Resp: 20   Filed Weights   12/10/14 1309  Weight: 282 lb 1.6 oz (127.96 kg)    GENERAL:alert, no distress and comfortable SKIN: skin color, texture, turgor are normal, no rashes or significant lesions EYES: normal, conjunctiva are pink and non-injected, sclera clear OROPHARYNX:no exudate, no erythema and lips, buccal mucosa, and tongue normal  NECK: supple, thyroid normal size, non-tender, without nodularity LYMPH:  no palpable lymphadenopathy in the cervical, axillary or inguinal LUNGS: clear to auscultation and percussion with normal breathing effort HEART: regular rate & rhythm and no murmurs and no lower extremity edema ABDOMEN:abdomen soft, non-tender and normal bowel sounds Musculoskeletal:no cyanosis of digits and no clubbing  PSYCH: alert & oriented x 3 with fluent speech NEURO: no focal motor/sensory deficits BREAST: No palpable nodules in breast. No palpable axillary or supraclavicular lymphadenopathy (exam performed in the presence of a chaperone)   LABORATORY DATA:  I have reviewed the data as  listed Lab Results  Component Value Date   WBC 5.6 12/10/2014   HGB 10.8* 12/10/2014   HCT 34.2* 12/10/2014   MCV 85.0 12/10/2014   PLT 344 12/10/2014   Lab Results  Component Value Date   NA 143 12/10/2014   K 3.5 12/10/2014   CL 108 04/11/2014   CO2 27 12/10/2014    RADIOGRAPHIC STUDIES: I have personally reviewed the radiological reports and agreed with the findings in the report.  ASSESSMENT AND PLAN:  Primary cancer of lower-inner quadrant of left female breast ([place]) Left breast DCIS low to intermediate grade with calcifications ER 100%, PR 100% TisN0 stage 0, spanning 5 mm  Pathology review: I discussed with the patient the difference between DCIS and invasive breast cancer. It is considered a precancerous lesion. DCIS is classified as a 0. It is generally detected through mammograms as calcifications. We discussed the significance of grades and its impact on prognosis. We also discussed the importance of ER and PR receptors and their implications to adjuvant treatment options. Prognosis of DCIS dependence on grade, comedo necrosis. It is anticipated that if not treated, 20-30% of DCIS can develop into invasive breast cancer.  Recommendation: 1. Breast conserving surgery 2. Followed by adjuvant radiation therapy 3. Followed by antiestrogen therapy with tamoxifen 5 years  Tamoxifen counseling: We discussed the risks and benefits of tamoxifen. These include but not limited to insomnia, hot flashes, mood changes, vaginal dryness, and weight gain. Although rare, serious side effects including endometrial cancer, risk of blood clots were also discussed. We strongly believe that the benefits far outweigh the risks. Patient understands these risks and consented to starting treatment. Planned treatment duration is 5 years.  Return to clinic after surgery to discuss the final pathology report and come up with an adjuvant treatment plan. After listening to the risks and benefits of  tamoxifen, she does not appear to be very keen on taking this medication. We will discuss this after 2 returns back from surgery.  All questions were answered. The patient knows to call the clinic with any problems, questions or concerns.    Rulon Eisenmenger, MD 3:37 PM

## 2014-12-11 ENCOUNTER — Ambulatory Visit (HOSPITAL_BASED_OUTPATIENT_CLINIC_OR_DEPARTMENT_OTHER): Payer: Medicare Other | Admitting: Genetic Counselor

## 2014-12-11 ENCOUNTER — Encounter: Payer: Self-pay | Admitting: General Practice

## 2014-12-11 ENCOUNTER — Encounter: Payer: Self-pay | Admitting: Genetic Counselor

## 2014-12-11 ENCOUNTER — Other Ambulatory Visit: Payer: Medicare Other

## 2014-12-11 DIAGNOSIS — Z808 Family history of malignant neoplasm of other organs or systems: Secondary | ICD-10-CM | POA: Diagnosis not present

## 2014-12-11 DIAGNOSIS — Z800 Family history of malignant neoplasm of digestive organs: Secondary | ICD-10-CM

## 2014-12-11 DIAGNOSIS — Z809 Family history of malignant neoplasm, unspecified: Secondary | ICD-10-CM

## 2014-12-11 DIAGNOSIS — Z803 Family history of malignant neoplasm of breast: Secondary | ICD-10-CM | POA: Diagnosis not present

## 2014-12-11 DIAGNOSIS — C50312 Malignant neoplasm of lower-inner quadrant of left female breast: Secondary | ICD-10-CM | POA: Diagnosis not present

## 2014-12-11 DIAGNOSIS — Z8041 Family history of malignant neoplasm of ovary: Secondary | ICD-10-CM

## 2014-12-11 NOTE — Progress Notes (Signed)
[facility] Psychosocial Distress Screening Spiritual Care  Accompanied by Counseling Intern Vaughan Sine, met with Ms Bobrowski at [facility] to introduce [facility] team/resources, reviewing distress screen per protocol.  The patient scored a 5 on the Psychosocial Distress Thermometer which indicates moderate distress. Also assessed for distress and other psychosocial needs.   ONCBCN DISTRESS SCREENING 12/11/2014  Screening Type Initial Screening  Distress experienced in past week (1-10) 5  Practical problem type Insurance;Food  Emotional problem type Depression;Nervousness/Anxiety  Information Concerns Type Lack of info about treatment;Lack of info about complementary therapy choices  Physical Problem type Pain;Sleep/insomnia;Constipation/diarrhea;Tingling hands/feet;Skin dry/itchy  Referral to clinical social work Yes  Referral to support programs Yes  Other Spiritual Care, Counseling Intern   Ms Nicklaus was very receptive to emotional support.  Per pt, information concerns are resolved, and distress has decreased to 4.  She reports support from therapist and psychiatrist for depression/anxiety, also welcoming support from clinical folks in cancer-care context.  Per pt, she is on disability, uses [employer], and is concerned about affording food.    Follow up needed: Yes.  Counseling Intern plans to f/u by phone.  Will refer to SW re financial concerns.  Per pt permission, will refer to [facility] for mentor.  Please also page as needs arise.  Thank you.  [place], [place], [facility] Pager [phone] Voicemail  [phone]

## 2014-12-11 NOTE — Progress Notes (Addendum)
REFERRING PROVIDER: Nicholas Lose, MD  PRIMARY PROVIDER:  Hoyt Koch, MD  PRIMARY REASON FOR VISIT:  1. Primary cancer of lower-inner quadrant of left female breast ([place])   2. Family history of breast cancer in female   3. Family history of ovarian cancer   4. Family history of pancreatic cancer   5. Family history of cancer in mother   66. Family history of cancer      HISTORY OF PRESENT ILLNESS:   Ms. Caitlyn Bautista, a 64 y.o. female, was seen for a [place] cancer genetics consultation at the request of Dr. Lindi Adie due to a personal history of breast cancer and family history of breast, ovarian, and other cancers.  Ms. Caitlyn Bautista presents to clinic today to discuss the possibility of a hereditary predisposition to cancer, genetic testing, and to further clarify her future cancer risks, as well as potential cancer risks for family members.   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 Surgical decisions will be determined pending results of genetic testing.   CANCER HISTORY:    Primary cancer of lower-inner quadrant of left female breast ([place])   11/26/2014 Mammogram Left mammogram 8:00 calcifications spanning 5 mm   12/02/2014 Initial Diagnosis Left breast biopsy: Low to intermediate grade DCIS with calcifications, ER 100%, PR 100%     HORMONAL RISK FACTORS:  Menarche was at age 89.  First live birth at age 87.  OCP use for approximately less than 2 years.  Ovaries intact: one ovary was removed at the time of her hysterectomy; one ovary remains.  Hysterectomy: yes due to fibroids and endometriosis.  Menopausal status: postmenopausal.  HRT use: 0 years. Colonoscopy: yes; normal. Mammogram within the last year: had not had a mammogram since 2012 prior to this one. Number of breast biopsies: 1. Up to date with pelvic exams:  yes. Any excessive radiation exposure in the past:  no  Past Medical History  Diagnosis Date  . PTSD (post-traumatic  stress disorder)   . Anxiety   . Fibromyalgia   . Bipolar disorder ([place])   . Arthritis   . Hypertension   . Thyroid disease   . Carpal tunnel syndrome, bilateral   . Asthma   . History of chicken pox   . Depression   . Hyperlipidemia   . Incontinence of urine in female   . Arachnoiditis   . Cancer of lower-inner quadrant of female breast ([place]) 12/05/2014  . Carpal tunnel syndrome   . Scoliosis   . Allergy     Past Surgical History  Procedure Laterality Date  . Tubal ligation    . Breast lumpectomy  1970's  . Abdominal hysterectomy  1990's  . Dilation and curettage of uterus      Social History   Social History  . Marital Status: Single    Spouse Name: N/A  . Number of Children: 4  . Years of Education: 11   Occupational History  . Disabled    Social History Main Topics  . Smoking status: Never Smoker   . Smokeless tobacco: Never Used  . Alcohol Use: Yes     Comment: Rarely  . Drug Use: No  . Sexual Activity: No   Other Topics Concern  . Not on file   Social History Narrative   Regular exercise-no   Caffeine Use-yes     FAMILY HISTORY:  We obtained a detailed, 4-generation family history.  Significant diagnoses are listed  below: Family History  Problem Relation Age of Onset  . Arthritis Other     Parent  . Hypertension Other     Parent  . Arthritis Other     Grandparent  . Hypertension Other     Grandparent  . Arthritis Other     Other Blood Relative  . Hypertension Other     Other Blood Relative  . Diabetes Other     Other Blood Relative  . Cancer Mother     unspecified type; dx. 95 or younger  . Ovarian cancer Sister 102  . Breast cancer Maternal Aunt     bilateral - dx. 30s, 55s  . Ovarian cancer Maternal Grandmother 100  . Ovarian cancer Sister 11  . Cervical cancer Sister 85  . Pancreatic cancer Maternal Uncle     dx. 50s-60s  . Cancer Cousin     either ovarian or cervical cancer  . Cancer Daughter     hx of TAH-BSO due to  fibroids and bleeding    Family History Addendum:  Ms. Caitlyn Bautista has one daughter, age 17 from one partnership.  This daughter has a history of a TAH-BSO due to fibroids and abnormal bleeding, but she has never had cancer.  Ms. Caitlyn Bautista has three daughters ages 69-40 from another partnership--none of whom have had cancer.  She has two maternal half-sisters and four maternal half-brothers.  Both maternal half-sisters have had ovarian cancer (these half-sisters themselves have different fathers).  One sister was diagnosed with ovarian cancer at 63.  Two of three daughters of this sister have had multiple lumps removed from their breasts. Ms. Caitlyn Bautista other sister was diagnosed with ovarian and cervical cancer at 69.  None of Ms. Caitlyn Bautista's brothers have had cancer.    Ms. Caitlyn Bautista father has passed away, she has no information for him or his family since she was adopted by the father of her half-siblings and she has limited contact.    Ms. Caitlyn Bautista mother died of an unspecified cancer.  Ms. Caitlyn Bautista reports that her mother had a history of lots of tumors and cysts and she had a large operation prior to her passing, which leads Ms. Caitlyn Bautista to believe that she died of cancer.  Ms. Caitlyn Bautista mother had one full brother who was not in contact with his family.  She also had three maternal half-brothers and four maternal half-sisters.  All of the brothers died between their 45s-60s; one died of pancreatic cancer.  One sister was diagnosed with metachronous bilateral breast cancers in her 40s and 31s.  She passed away in her late 28s.  Ms. Caitlyn Bautista has no information for this aunt's daughters, but reports that her maternal first cousin, once-removed was diagnosed with either ovarian or cervical cancer.  Another maternal aunt died at 27; another passed away at an unspecified age.  One maternal aunt is still living, but Ms. Caitlyn Bautista has no further information for her.  Ms. Caitlyn Bautista maternal grandmother died of ovarian cancer at the  age of 14.  Her maternal grandfather died in his 75s-80s.  She has no further information for other relatives, and she reports that none of her siblings or other relatives have had genetic testing.    Patient's maternal ancestors are of Irish/Native American-Black[place]/African American descent, and paternal ancestors are of Caribbean/"Bohemian"/Native American/African American descent. Ms. Skilton reports potential Ashkenazi Jewish ancestry for both sides of her family. There is no known consanguinity.  GENETIC COUNSELING ASSESSMENT: Duane Trias is a 64 y.o. female with a  personal and family history of cancer which is somewhat suggestive of a hereditary breast/ovarian cancer syndrome and predisposition to cancer. We, therefore, discussed and recommended the following at today's visit.   DISCUSSION: We reviewed the characteristics, features and inheritance patterns of hereditary cancer syndromes, particularly those caused by mutations within the BRCA1/2 and Lynch syndrome genes. We also discussed genetic testing, including the appropriate family members to test, the process of testing, insurance coverage and turn-around-time for results. We discussed the implications of a negative, positive and/or variant of uncertain significant result. We recommended Ms. Mittelstadt pursue genetic testing for the 20-gene Breast/Ovarian Cancer Panel through [facility] Hope Pigeon, MD).  The Breast/Ovarian Cancer Panel offered by [facility] includes sequencing and deletion/duplication analysis for the following 19 genes:  ATM, BARD1, BRCA1, BRCA2, BRIP1, CDH1, CHEK2, FANCC, MLH1, MSH2, MSH6, NBN, PALB2, PMS2, PTEN, RAD51C, RAD51D, TP53, and XRCC2.  This panel also includes deletion/duplication analysis (without sequencing) for one gene, EPCAM.   Based on Ms. Delahoz's personal and family history of cancer, she meets medical criteria for genetic testing. Despite that she meets criteria, she may still have an out of pocket  cost. We discussed that if her out of pocket cost for testing is over $100, the laboratory will call and confirm whether she wants to proceed with testing.  If the out of pocket cost of testing is less than $100 she will be billed by the genetic testing laboratory.   PLAN: After considering the risks, benefits, and limitations, Ms. Mangine  provided informed consent to pursue genetic testing and the blood sample was sent to [facility] for analysis of the 20-gene Breast/Ovarian Cancer Panel. Results are generally available within approximately 2-3 weeks' time, however, since Ms. Szymborski will use this information for surgical decision-making, we will let [facility] know that we need these results STAT.  Thus, we should get these results back closer to the 2-week time frame.  At that point they will be disclosed by telephone to Ms. Crupi, as will any additional recommendations warranted by these results. Ms. Quast will receive a summary of her genetic counseling visit and a copy of her results once available. This information will also be available in Epic. We encouraged Ms. Batta to remain in contact with cancer genetics annually so that we can continuously update the family history and inform her of any changes in cancer genetics and testing that may be of benefit for her family. Ms. Dicola questions were answered to her satisfaction today. Our contact information was provided should additional questions or concerns arise.  Thank you for the referral and allowing [place] to share in the care of your patient.   Jeanine Luz, MS Genetic Counselor [email or web address] Phone: [phone]  The patient was seen for a total of 60 minutes in face-to-face genetic counseling.  This patient was discussed with Drs. Magrinat, Lindi Adie and/or Burr Medico who agrees with the above.    _______________________________________________________________________ For Office Staff:  Number of people involved in session:  1 Was an Intern/ student involved with case: no

## 2014-12-11 NOTE — Progress Notes (Deleted)
Family History Addendum:  Ms. Correia has one daughter, age 62 from one partnership.  This daughter has a history of a TAH-BSO due to fibroids and abnormal bleeding, but she has never had cancer.  Ms. Finau has three daughters ages 26-40 from another partnership--none of whom have had cancer.  She has two maternal half-sisters and four maternal half-brothers.  Both maternal half-sisters have had ovarian cancer (these half-sisters themselves have different fathers).  One sister was diagnosed with ovarian cancer at 82.  Two of three daughters of this sister have had multiple lumps removed from their breasts. Ms. Stahlman other sister was diagnosed with ovarian and cervical cancer at 59.  None of Ms. Mccrone's brothers have had cancer.    Ms. Starnes father has passed away, she has no information for him or his family since she was adopted by the father of her half-siblings and she has limited contact.    Ms. Speers mother died of an unspecified cancer.  Ms. Gregg reports that her mother had a history of lots of tumors and cysts and she had a large operation prior to her passing, which leads Ms. Pontarelli to believe that she died of cancer.  Ms. Dearing mother had one full brother who was not in contact with his family.  She also had three maternal half-brothers and four maternal half-sisters.  All of the brothers died between their 59s-60s; one died of pancreatic cancer.  One sister was diagnosed with metachronous bilateral breast cancers in her 75s and 57s.  She passed away in her late 86s.  Ms. Prange has no information for this aunt's daughters, but reports that her maternal first cousin, once-removed was diagnosed with either ovarian or cervical cancer.  Another maternal aunt died at 74; another passed away at an unspecified age.  One maternal aunt is still living, but Ms. Fritzler has no further information for her.  Ms. Makris maternal grandmother died of ovarian cancer at the age of 22.  Her maternal  grandfather died in his 42s-80s.  She has no further information for other relatives, and she reports that none of her siblings or other relatives have had genetic testing.

## 2014-12-15 NOTE — Progress Notes (Signed)
Note created by Dr. Gudena during office visit. Copy to patient, original to scan. 

## 2014-12-16 ENCOUNTER — Encounter: Payer: Self-pay | Admitting: Hematology and Oncology

## 2014-12-16 ENCOUNTER — Telehealth: Payer: Self-pay | Admitting: *Deleted

## 2014-12-16 ENCOUNTER — Telehealth: Payer: Self-pay | Admitting: Internal Medicine

## 2014-12-16 ENCOUNTER — Encounter: Payer: Self-pay | Admitting: *Deleted

## 2014-12-16 NOTE — Telephone Encounter (Signed)
Patient would like to notify provider that she was diagnosised with breast cancer.  She is currently being treated by the cancer center.

## 2014-12-16 NOTE — Progress Notes (Signed)
Pt called w/ financial concerns.  After reviewing pt's chart Dr. Lindi Adie would like her to return to clinic after surgery to discuss the final pathology report and come up with an adjuvant treatment plan.  I relayed this info to her and once a treatment plan has been established I can research copay assistance foundations.  She mentioned she is going to take radiation so I gave her Meredith's contact info to discuss any financial assistance programs with her.  She verbalized understanding.

## 2014-12-16 NOTE — Telephone Encounter (Signed)
Spoke with patient from [facility] 12/10/14.  She is doing well. She requested financial and social work numbers.  Gave contact information.  Encouraged her to call with any needs or concerns.

## 2014-12-16 NOTE — Progress Notes (Signed)
[place] Work  Clinical Social Work was referred by chaplin for assessment of psychosocial needs.  Clinical Social Worker contacted patient at home to offer support and assess for needs.  Patient expressed multiple concerns including emotional and financial.  Patient discussed her insurance and increased medical bills.  CSW and patient explored resource options including Medicaid and food stamps.  CSW encouraged patient to contact the financial advocate to explore assistance available and insurance concerns.  CSW offered additional support and explained CSW role.  CSW and patient are scheduled to meet on 10/19 to further discuss available support.           Johnnye Lana, MSW, LCSW, OSW-C Clinical Social Worker [facility] [phone]

## 2014-12-24 ENCOUNTER — Ambulatory Visit: Payer: Self-pay | Admitting: Genetic Counselor

## 2014-12-24 ENCOUNTER — Telehealth: Payer: Self-pay | Admitting: Genetic Counselor

## 2014-12-24 DIAGNOSIS — Z1379 Encounter for other screening for genetic and chromosomal anomalies: Secondary | ICD-10-CM

## 2014-12-24 NOTE — Telephone Encounter (Signed)
Discussed with Ms. Clippard that her genetic testing was negative for pathogenic mutations within any of 20 genes that would cause her to be at an increased risk for breast, ovarian, or other related cancers.  Additionally, no uncertain changes were found.  We discussed that a negative test result can mean a few things.  It could mean that Ms. Sedlar's cancer is sporadic and is not related to any genetic risk.  On the other hand, it cannot totally rule out a genetic cause for her breast cancer, since we could just not be testing the right genes at this point in time.  Another potential scenario is that there could be a mutation in the family that explains the family history of cancer (in particular her sisters' histories of ovarian cancer) that she herself just did not inherit.  Thus, we discussed that it would be helpful for her maternal half-sisters (these sisters are also half-sisters to each other) to have genetic counseling and testing to give [place] more information about this family history of cancer.  Ms. Heuerman reported that these sisters do live in the area, so we would be happy to facilitate testing here, if they are interested.  She is welcome to pass along my contact information to them.  We discussed that since we still do not have an explanation for Ms. Consuegra's breast cancer or for the family history, we are still going to screen her more cautiously in the future.  Additionally, women in the family who have not begun mammogram screening can start that at the age of 44 or 32 years younger than the earliest diagnosis in the family--whichever comes first.  We also consider women in the family to be at a somewhat increased risk for ovarian cancer, based on the family history.  This is another reason why genetic testing for Ms. Rutledge's half-sisters can be helpful--for better defining ovarian cancer risks for Ms. Angela Adam.  Ms. Tiedeman still has one ovary remaining, we discussed that she should at least have a  discussion with her doctor about her risks and about the option for oophorectomy vs. Continuing CA-125 screening.  Ms. Co is welcome to call me with any questions she may have; the same is true for her family members.

## 2014-12-29 ENCOUNTER — Other Ambulatory Visit: Payer: Self-pay | Admitting: General Surgery

## 2014-12-29 DIAGNOSIS — D0512 Intraductal carcinoma in situ of left breast: Secondary | ICD-10-CM

## 2014-12-30 ENCOUNTER — Encounter: Payer: Self-pay | Admitting: *Deleted

## 2014-12-30 NOTE — Progress Notes (Signed)
[place] Work  Holiday representative received phone call from patient requesting Part B of the SCAt application be submitted.  Patient currently has been approved for SCAT, but is up for renewal.  CSW completed and submitted Part B of the application.  SCAT will contact patient once it is received and reviewed.   Johnnye Lana, MSW, LCSW, OSW-C Clinical Social Worker [facility] [phone]

## 2015-01-02 DIAGNOSIS — Z1379 Encounter for other screening for genetic and chromosomal anomalies: Secondary | ICD-10-CM | POA: Insufficient documentation

## 2015-01-02 DIAGNOSIS — Z01810 Encounter for preprocedural cardiovascular examination: Secondary | ICD-10-CM | POA: Insufficient documentation

## 2015-01-02 NOTE — Progress Notes (Signed)
GENETIC TEST RESULTS  HPI: Ms. Caitlyn Bautista was previously seen in the [facility] clinic due to a personal history of breast cancer, family history of breast, ovarian, and other cancers, and concerns regarding a hereditary predisposition to cancer. Please refer to our prior cancer genetics clinic note from December 11, 2014 for more information regarding Ms. Caitlyn Bautista's medical, social and family histories, and our assessment and recommendations, at the time. Ms. Caitlyn Bautista recent genetic test results were disclosed to her, as were recommendations warranted by these results. These results and recommendations are discussed in more detail below.  GENETIC TEST RESULTS: At the time of Ms. Caitlyn Bautista's visit on 12/11/14, we recommended she pursue genetic testing of the 20-gene Breast/Ovarian Cancer Panel through [facility] Caitlyn Pigeon, MD).  Based on ancestry, BRCA1/2 the Ashkenazi Founder Panel was performed first and was negative.  Date of report is December 15, 2014.  Thus, testing reflexed to the 20-gene Breast/Ovarian Cancer Panel.  The Breast/Ovarian Cancer Panel offered by [facility] includes sequencing and deletion/duplication analysis for the following 19 genes:  ATM, BARD1, BRCA1, BRCA2, BRIP1, CDH1, CHEK2, FANCC, MLH1, MSH2, MSH6, NBN, PALB2, PMS2, PTEN, RAD51C, RAD51D, TP53, and XRCC2.  This panel also includes deletion/duplication analysis (without sequencing) for one gene, EPCAM.  Those results are now back, the report date is December 22, 2014.  Genetic testing was normal, and did not reveal a deleterious mutation in these genes.  Additionally, no variants of uncertain significance (VUSes) were found. The test report will be scanned into EPIC and will be located under the Results Review tab in the Pathology>Molecular Pathology section.   We discussed with Caitlyn Bautista that since the current genetic testing is not perfect, it is possible there may be a gene mutation in one of these genes that  current testing cannot detect, but that chance is small. We also discussed, that it is possible that another gene that has not yet been discovered, or that we have not yet tested, is responsible for the cancer diagnoses in the family, and it is, therefore, important to remain in touch with cancer genetics in the future so that we can continue to offer Ms. Caitlyn Bautista the most up to date genetic testing.   CANCER SCREENING RECOMMENDATIONS:  Given Ms. Caitlyn Bautista's personal and family histories, we must interpret these negative results with some caution.  Families with features suggestive of hereditary risk for cancer tend to have multiple family members with cancer, diagnoses in multiple generations and diagnoses before the age of 50. Ms. Caitlyn Bautista's family exhibits some of these features. Thus this result may simply reflect our current inability to detect all mutations within these genes or there may be a different gene that has not yet been discovered or tested.  Another potential scenario, especially since, Ms. Caitlyn Bautista was diagnosed with her breast cancer at a later age, is that there is a hereditary cancer syndrome that explains the family history of cancer, but that Ms. Caitlyn Bautista herself did not inherit.  It would be helpful if more of Ms. Caitlyn Bautista's relatives had genetic counseling and testing to give [place] more information about the cancer risks.    Based on the family history of ovarian cancer and the fact that Ms. Caitlyn Bautista still has one ovary remaining, we discussed that she should at least have a discussion with her doctor about her risks and about the option for oophorectomy vs. continuing CA-125 screening.   RECOMMENDATIONS FOR FAMILY MEMBERS: Women in this family are at an increased risk of developing  cancer, over the general population risk, simply due to the family history of cancer. We recommended women in this family have a yearly mammogram beginning at age 61, or 30 years younger than the earliest onset of cancer,  an an annual clinical breast exam, and perform monthly breast self-exams. Women in this family should also have a gynecological exam as recommended by their primary provider. All family members should have a colonoscopy by age 79.   We discussed that it would be helpful for her two maternal half-sisters (these sisters are also half-sisters to each other) to have genetic counseling and testing to give [place] more information about this family history of cancer.  Both of these sisters were diagnosed with ovarian cancer at early ages.  Ms. Caitlyn Bautista reported that these sisters do live in the area, so we would be happy to facilitate testing here, if they are interested.  She is welcome to pass along my contact information to them.  We discussed that since we still do not have an explanation for Ms. Caitlyn Bautista's breast cancer or for the family history, we are still going to screen her more cautiously in the future.  Testing for Ms. Caitlyn Bautista's maternal half-sisters may be helpful to better define the ovarian cancer risks for the women in the family.    FOLLOW-UP: Lastly, we discussed with Ms. Caitlyn Bautista that cancer genetics is a rapidly advancing field and it is possible that new genetic tests will be appropriate for her and/or her family members in the future. We encouraged her to remain in contact with cancer genetics on an annual basis so we can update her personal and family histories and let her know of advances in cancer genetics that may benefit this family.   Our contact number was provided. Ms. Caitlyn Bautista questions were answered to her satisfaction, and she knows she is welcome to call us at anytime with additional questions or concerns.   Jeanine Luz, MS Genetic Counselor [email or web address] Phone: [phone]

## 2015-01-13 ENCOUNTER — Other Ambulatory Visit: Payer: Self-pay | Admitting: *Deleted

## 2015-01-14 ENCOUNTER — Other Ambulatory Visit: Payer: Self-pay | Admitting: General Surgery

## 2015-01-14 ENCOUNTER — Telehealth: Payer: Self-pay | Admitting: Hematology and Oncology

## 2015-01-14 DIAGNOSIS — D0512 Intraductal carcinoma in situ of left breast: Secondary | ICD-10-CM

## 2015-01-14 NOTE — Telephone Encounter (Signed)
Called and left a message with a new  surgical follow up appointment

## 2015-01-15 ENCOUNTER — Other Ambulatory Visit: Payer: Self-pay | Admitting: General Surgery

## 2015-01-15 DIAGNOSIS — D0512 Intraductal carcinoma in situ of left breast: Secondary | ICD-10-CM

## 2015-01-21 ENCOUNTER — Telehealth: Payer: Self-pay | Admitting: *Deleted

## 2015-01-21 ENCOUNTER — Encounter (HOSPITAL_COMMUNITY)
Admission: RE | Admit: 2015-01-21 | Discharge: 2015-01-21 | Disposition: A | Discharge status: Home / Self Care | Payer: Medicare Other | Source: Ambulatory Visit | Attending: General Surgery | Admitting: General Surgery

## 2015-01-21 ENCOUNTER — Encounter (HOSPITAL_COMMUNITY): Payer: Self-pay

## 2015-01-21 ENCOUNTER — Other Ambulatory Visit: Payer: Self-pay

## 2015-01-21 DIAGNOSIS — Z01812 Encounter for preprocedural laboratory examination: Secondary | ICD-10-CM | POA: Diagnosis not present

## 2015-01-21 DIAGNOSIS — D0512 Intraductal carcinoma in situ of left breast: Secondary | ICD-10-CM | POA: Diagnosis not present

## 2015-01-21 DIAGNOSIS — R9431 Abnormal electrocardiogram [ECG] [EKG]: Secondary | ICD-10-CM | POA: Diagnosis not present

## 2015-01-21 DIAGNOSIS — Z01818 Encounter for other preprocedural examination: Secondary | ICD-10-CM | POA: Diagnosis present

## 2015-01-21 HISTORY — DX: Personal history of pneumonia (recurrent): Z87.01

## 2015-01-21 HISTORY — DX: Frequency of micturition: R35.0

## 2015-01-21 HISTORY — DX: Urgency of urination: R39.15

## 2015-01-21 HISTORY — DX: Other cervical disc displacement, unspecified cervical region: M50.20

## 2015-01-21 HISTORY — DX: Anemia, unspecified: D64.9

## 2015-01-21 HISTORY — DX: Personal history of other diseases of the respiratory system: Z87.09

## 2015-01-21 LAB — CBC
HCT: 37 % (ref 36.0–46.0)
HEMOGLOBIN: 11.7 g/dL — AB (ref 12.0–15.0)
MCH: 27.5 pg (ref 26.0–34.0)
MCHC: 31.6 g/dL (ref 30.0–36.0)
MCV: 87.1 fL (ref 78.0–100.0)
PLATELETS: 366 10*3/uL (ref 150–400)
RBC: 4.25 MIL/uL (ref 3.87–5.11)
RDW: 16.4 % — AB (ref 11.5–15.5)
WBC: 9.5 10*3/uL (ref 4.0–10.5)

## 2015-01-21 LAB — BASIC METABOLIC PANEL
ANION GAP: 7 (ref 5–15)
BUN: 10 mg/dL (ref 6–20)
CALCIUM: 9.4 mg/dL (ref 8.9–10.3)
CO2: 26 mmol/L (ref 22–32)
Chloride: 107 mmol/L (ref 101–111)
Creatinine, Ser: 0.92 mg/dL (ref 0.44–1.00)
GLUCOSE: 84 mg/dL (ref 65–99)
POTASSIUM: 4.2 mmol/L (ref 3.5–5.1)
SODIUM: 140 mmol/L (ref 135–145)

## 2015-01-21 LAB — NO BLOOD PRODUCTS

## 2015-01-21 NOTE — Telephone Encounter (Signed)
Left msg on triage stating she is schedule to have breast surgery next week went for pre-op visit today and was told to contact her PCP for a zpac to be call in. Have a scratchy throat, and cough. MD is out of the office the rest of the week. Pls advise...Johny Chess

## 2015-01-21 NOTE — Progress Notes (Signed)
PCP - Dr. Pricilla Holm Cardiologist - denies  EKG- 01/21/15 CXR - denies  Echo- states she has had an Echo but does not know when or where Stress test - > 10 years ago Cardiac cath -denies  Patient denies chest pain and shortness of breath at PAT appointment.  Patient states that she does have a sore throat but has not had a fever.  Patient is concerned about being sick prior to surgery.  Dr. Ethlyn Gallery office made aware and patient encouraged to follow-up with PCP.  Patient also states that she is supposed to have a breast reconstruction.  No orders for reconstruction at PAT appointment.  Spoke with Raquel Sarna at Dr. Ethlyn Gallery office who stated that patient is scheduled to have a reconstruction completed by Dr. Migdalia Dk.  Dr. Leafy Ro office notified that orders are missing for PAT appointment.

## 2015-01-21 NOTE — Pre-Procedure Instructions (Signed)
Caitlyn Bautista  01/21/2015     Your procedure is scheduled on : Wednesday January 28, 2015 at 1:30 PM.  Report to [place] immediately after leaving the breast center.  Call this number if you have problems the morning of surgery: [phone]    Remember:  Do not eat food or drink liquids after midnight.  Take these medicines the morning of surgery with A SIP OF WATER : Albuterol inhaler if needed (Please bring inhaler with you), Amlodipine (Norvasc), Bupropion (Wellbutrin), Diazepam (Vaium) if needed, Duloxetine (Cymbalta), Fexofenadine (Allegra), Flonase nasal spray, Hydrocodone if needed, Lithium, Pregabalin (Lyrica), Ranitidine (Zantac)   Stop taking any aspirin, vitamins, herbal medications, Ibuprofen, Advil, Motrin, Aleve, Goodys, etc on Friday November 18th.   Do not wear jewelry, make-up or nail polish.  Do not wear lotions, powders, or perfumes.  You may NOT wear deodorant.  Do not shave 48 hours prior to surgery.    Do not bring valuables to the hospital.  [facility] is not responsible for any belongings or valuables.  Contacts, dentures or bridgework may not be worn into surgery.  Leave your suitcase in the car.  After surgery it may be brought to your room.  For patients admitted to the hospital, discharge time will be determined by your treatment team.  Patients discharged the day of surgery will not be allowed to drive home.   Name and phone number of your driver:   Special instructions:  Shower using CHG soap the night before and the morning of your surgery  Please read over the following fact sheets that you were given. Pain Booklet, Coughing and Deep Breathing and Surgical Site Infection Prevention

## 2015-01-21 NOTE — Progress Notes (Signed)
   01/21/15 1137  OBSTRUCTIVE SLEEP APNEA  Have you ever been diagnosed with sleep apnea through a sleep study? No  Do you snore loudly (loud enough to be heard through closed doors)?  1  Do you often feel tired, fatigued, or sleepy during the daytime (such as falling asleep during driving or talking to someone)? 1  Has anyone observed you stop breathing during your sleep? 0  Do you have, or are you being treated for high blood pressure? 1  BMI more than 35 kg/m2? 1  Age > 50 (1-yes) 1  Neck circumference greater than:Female 16 inches or larger, Female 17inches or larger? 1  Female Gender (Yes=1) 0  Obstructive Sleep Apnea Score 6

## 2015-01-22 MED ORDER — AZITHROMYCIN 500 MG PO TABS: 500.0000 mg | ORAL_TABLET | Freq: Every day | ORAL | Status: DC

## 2015-01-22 NOTE — Telephone Encounter (Signed)
Notified pt antibiotic was sent to [place]...Caitlyn Bautista

## 2015-01-22 NOTE — Telephone Encounter (Signed)
done

## 2015-01-27 ENCOUNTER — Other Ambulatory Visit: Payer: Self-pay | Admitting: Plastic Surgery

## 2015-01-27 DIAGNOSIS — C50912 Malignant neoplasm of unspecified site of left female breast: Secondary | ICD-10-CM

## 2015-01-27 MED ORDER — CHLORHEXIDINE GLUCONATE 4 % EX LIQD: 1.0000 "application " | Freq: Once | CUTANEOUS | Status: DC

## 2015-01-27 MED ORDER — CEFAZOLIN SODIUM-DEXTROSE 2-3 GM-% IV SOLR: 2.0000 g | INTRAVENOUS | Status: DC

## 2015-01-28 ENCOUNTER — Ambulatory Visit (HOSPITAL_COMMUNITY)
Admission: RE | Admit: 2015-01-28 | Discharge: 2015-01-28 | Disposition: A | Discharge status: Home / Self Care | Payer: Medicare Other | Source: Ambulatory Visit | Attending: General Surgery | Admitting: General Surgery

## 2015-01-28 ENCOUNTER — Encounter (HOSPITAL_COMMUNITY): Payer: Self-pay | Admitting: *Deleted

## 2015-01-28 ENCOUNTER — Ambulatory Visit (HOSPITAL_COMMUNITY): Payer: Medicare Other | Admitting: Certified Registered Nurse Anesthetist

## 2015-01-28 ENCOUNTER — Ambulatory Visit (HOSPITAL_COMMUNITY): Payer: Medicare Other | Admitting: Emergency Medicine

## 2015-01-28 ENCOUNTER — Ambulatory Visit
Admission: RE | Admit: 2015-01-28 | Discharge: 2015-01-28 | Disposition: A | Discharge status: Home / Self Care | Payer: Medicare Other | Source: Ambulatory Visit | Attending: General Surgery | Admitting: General Surgery

## 2015-01-28 ENCOUNTER — Encounter (HOSPITAL_COMMUNITY)
Admission: RE | Disposition: A | Discharge status: Home / Self Care | Payer: Self-pay | Source: Ambulatory Visit | Attending: General Surgery

## 2015-01-28 DIAGNOSIS — C50912 Malignant neoplasm of unspecified site of left female breast: Secondary | ICD-10-CM

## 2015-01-28 DIAGNOSIS — M797 Fibromyalgia: Secondary | ICD-10-CM | POA: Insufficient documentation

## 2015-01-28 DIAGNOSIS — G4730 Sleep apnea, unspecified: Secondary | ICD-10-CM | POA: Diagnosis not present

## 2015-01-28 DIAGNOSIS — N6012 Diffuse cystic mastopathy of left breast: Secondary | ICD-10-CM | POA: Insufficient documentation

## 2015-01-28 DIAGNOSIS — D0512 Intraductal carcinoma in situ of left breast: Secondary | ICD-10-CM | POA: Insufficient documentation

## 2015-01-28 DIAGNOSIS — D649 Anemia, unspecified: Secondary | ICD-10-CM | POA: Insufficient documentation

## 2015-01-28 DIAGNOSIS — G43909 Migraine, unspecified, not intractable, without status migrainosus: Secondary | ICD-10-CM | POA: Insufficient documentation

## 2015-01-28 DIAGNOSIS — Z803 Family history of malignant neoplasm of breast: Secondary | ICD-10-CM | POA: Insufficient documentation

## 2015-01-28 DIAGNOSIS — J45909 Unspecified asthma, uncomplicated: Secondary | ICD-10-CM | POA: Insufficient documentation

## 2015-01-28 DIAGNOSIS — G5603 Carpal tunnel syndrome, bilateral upper limbs: Secondary | ICD-10-CM | POA: Insufficient documentation

## 2015-01-28 DIAGNOSIS — K219 Gastro-esophageal reflux disease without esophagitis: Secondary | ICD-10-CM | POA: Diagnosis not present

## 2015-01-28 DIAGNOSIS — E7800 Pure hypercholesterolemia, unspecified: Secondary | ICD-10-CM | POA: Diagnosis not present

## 2015-01-28 DIAGNOSIS — F319 Bipolar disorder, unspecified: Secondary | ICD-10-CM | POA: Diagnosis not present

## 2015-01-28 DIAGNOSIS — M419 Scoliosis, unspecified: Secondary | ICD-10-CM | POA: Diagnosis not present

## 2015-01-28 DIAGNOSIS — E785 Hyperlipidemia, unspecified: Secondary | ICD-10-CM | POA: Diagnosis not present

## 2015-01-28 DIAGNOSIS — N6092 Unspecified benign mammary dysplasia of left breast: Secondary | ICD-10-CM | POA: Diagnosis not present

## 2015-01-28 DIAGNOSIS — M1990 Unspecified osteoarthritis, unspecified site: Secondary | ICD-10-CM | POA: Insufficient documentation

## 2015-01-28 DIAGNOSIS — F4310 Post-traumatic stress disorder, unspecified: Secondary | ICD-10-CM | POA: Diagnosis not present

## 2015-01-28 DIAGNOSIS — I10 Essential (primary) hypertension: Secondary | ICD-10-CM | POA: Diagnosis not present

## 2015-01-28 DIAGNOSIS — N62 Hypertrophy of breast: Secondary | ICD-10-CM | POA: Insufficient documentation

## 2015-01-28 DIAGNOSIS — M549 Dorsalgia, unspecified: Secondary | ICD-10-CM | POA: Insufficient documentation

## 2015-01-28 DIAGNOSIS — M542 Cervicalgia: Secondary | ICD-10-CM | POA: Insufficient documentation

## 2015-01-28 DIAGNOSIS — Z888 Allergy status to other drugs, medicaments and biological substances status: Secondary | ICD-10-CM | POA: Diagnosis not present

## 2015-01-28 DIAGNOSIS — Z8049 Family history of malignant neoplasm of other genital organs: Secondary | ICD-10-CM | POA: Diagnosis not present

## 2015-01-28 DIAGNOSIS — F419 Anxiety disorder, unspecified: Secondary | ICD-10-CM | POA: Diagnosis not present

## 2015-01-28 HISTORY — PX: BREAST LUMPECTOMY WITH NEEDLE LOCALIZATION: SHX5759

## 2015-01-28 HISTORY — PX: BREAST REDUCTION SURGERY: SHX8

## 2015-01-28 HISTORY — PX: BREAST LUMPECTOMY: SHX2

## 2015-01-28 SURGERY — BREAST LUMPECTOMY WITH NEEDLE LOCALIZATION
Anesthesia: General | Site: Breast | Laterality: Left

## 2015-01-28 MED ORDER — SUCCINYLCHOLINE CHLORIDE 20 MG/ML IJ SOLN
INTRAMUSCULAR | Status: DC | PRN
  Administered 2015-01-28: 140 mg via INTRAVENOUS

## 2015-01-28 MED ORDER — 0.9 % SODIUM CHLORIDE (POUR BTL) OPTIME
TOPICAL | Status: DC | PRN
  Administered 2015-01-28: 1000 mL

## 2015-01-28 MED ORDER — CEPHALEXIN 500 MG PO CAPS: 500.0000 mg | ORAL_CAPSULE | Freq: Four times a day (QID) | ORAL | Status: DC

## 2015-01-28 MED ORDER — MIDAZOLAM HCL 5 MG/5ML IJ SOLN
INTRAMUSCULAR | Status: DC | PRN
  Administered 2015-01-28: 2 mg via INTRAVENOUS

## 2015-01-28 MED ORDER — HYDROCODONE-ACETAMINOPHEN 5-325 MG PO TABS: 1.0000 | ORAL_TABLET | ORAL | Status: DC | PRN

## 2015-01-28 MED ORDER — HYDROMORPHONE HCL 1 MG/ML IJ SOLN
INTRAMUSCULAR | Status: AC
  Filled 2015-01-28: qty 1

## 2015-01-28 MED ORDER — MIDAZOLAM HCL 2 MG/2ML IJ SOLN
INTRAMUSCULAR | Status: AC
  Filled 2015-01-28: qty 2

## 2015-01-28 MED ORDER — HYDROMORPHONE HCL 1 MG/ML IJ SOLN
0.2500 mg | INTRAMUSCULAR | Status: DC | PRN
  Administered 2015-01-28 (×2): 0.5 mg via INTRAVENOUS

## 2015-01-28 MED ORDER — LIDOCAINE HCL (CARDIAC) 20 MG/ML IV SOLN
INTRAVENOUS | Status: DC | PRN
  Administered 2015-01-28: 100 mg via INTRAVENOUS

## 2015-01-28 MED ORDER — BUPIVACAINE-EPINEPHRINE 0.25% -1:200000 IJ SOLN
INTRAMUSCULAR | Status: DC | PRN
  Administered 2015-01-28: 30 mL

## 2015-01-28 MED ORDER — PHENYLEPHRINE HCL 10 MG/ML IJ SOLN
10.0000 mg | INTRAVENOUS | Status: DC | PRN
  Administered 2015-01-28: 20 ug/min via INTRAVENOUS

## 2015-01-28 MED ORDER — FENTANYL CITRATE (PF) 250 MCG/5ML IJ SOLN
INTRAMUSCULAR | Status: AC
  Filled 2015-01-28: qty 5

## 2015-01-28 MED ORDER — LACTATED RINGERS IV SOLN
INTRAVENOUS | Status: DC
  Administered 2015-01-28 (×2): via INTRAVENOUS

## 2015-01-28 MED ORDER — PHENYLEPHRINE HCL 10 MG/ML IJ SOLN
INTRAMUSCULAR | Status: DC | PRN
  Administered 2015-01-28 (×2): 120 ug via INTRAVENOUS
  Administered 2015-01-28 (×2): 80 ug via INTRAVENOUS

## 2015-01-28 MED ORDER — ONDANSETRON HCL 4 MG/2ML IJ SOLN
INTRAMUSCULAR | Status: DC | PRN
  Administered 2015-01-28: 4 mg via INTRAVENOUS

## 2015-01-28 MED ORDER — MEPERIDINE HCL 25 MG/ML IJ SOLN: 6.2500 mg | INTRAMUSCULAR | Status: DC | PRN

## 2015-01-28 MED ORDER — PROPOFOL 10 MG/ML IV BOLUS
INTRAVENOUS | Status: AC
  Filled 2015-01-28: qty 20

## 2015-01-28 MED ORDER — FENTANYL CITRATE (PF) 100 MCG/2ML IJ SOLN
INTRAMUSCULAR | Status: DC | PRN
  Administered 2015-01-28: 50 ug via INTRAVENOUS
  Administered 2015-01-28: 100 ug via INTRAVENOUS
  Administered 2015-01-28 (×2): 50 ug via INTRAVENOUS

## 2015-01-28 MED ORDER — ROCURONIUM BROMIDE 100 MG/10ML IV SOLN
INTRAVENOUS | Status: DC | PRN
  Administered 2015-01-28: 20 mg via INTRAVENOUS

## 2015-01-28 MED ORDER — PROPOFOL 10 MG/ML IV BOLUS
INTRAVENOUS | Status: DC | PRN
  Administered 2015-01-28: 200 mg via INTRAVENOUS

## 2015-01-28 MED ORDER — PROMETHAZINE HCL 25 MG/ML IJ SOLN: 6.2500 mg | INTRAMUSCULAR | Status: DC | PRN

## 2015-01-28 MED ORDER — CEFAZOLIN SODIUM 10 G IJ SOLR
3.0000 g | INTRAMUSCULAR | Status: AC
  Administered 2015-01-28: 3 g via INTRAVENOUS
  Filled 2015-01-28: qty 3000

## 2015-01-28 MED ORDER — SODIUM CHLORIDE 0.9 % IJ SOLN
INTRAMUSCULAR | Status: DC | PRN
  Administered 2015-01-28: 50 mL

## 2015-01-28 MED ORDER — BUPIVACAINE-EPINEPHRINE (PF) 0.25% -1:200000 IJ SOLN
INTRAMUSCULAR | Status: AC
  Filled 2015-01-28: qty 30

## 2015-01-28 SURGICAL SUPPLY — 49 items
APPLIER CLIP 9.375 MED OPEN (MISCELLANEOUS) ×4
BINDER BREAST XXLRG (GAUZE/BANDAGES/DRESSINGS) ×4 IMPLANT
BLADE SURG 10 STRL SS (BLADE) ×4 IMPLANT
BLADE SURG 15 STRL LF DISP TIS (BLADE) ×2 IMPLANT
BLADE SURG 15 STRL SS (BLADE) ×2
CANISTER SUCTION 2500CC (MISCELLANEOUS) ×4 IMPLANT
CHLORAPREP W/TINT 26ML (MISCELLANEOUS) ×4 IMPLANT
CLIP APPLIE 9.375 MED OPEN (MISCELLANEOUS) ×2 IMPLANT
CONT SPEC 4OZ CLIKSEAL STRL BL (MISCELLANEOUS) ×12 IMPLANT
COVER SURGICAL LIGHT HANDLE (MISCELLANEOUS) ×4 IMPLANT
DERMABOND ADVANCED (GAUZE/BANDAGES/DRESSINGS) ×4
DERMABOND ADVANCED .7 DNX12 (GAUZE/BANDAGES/DRESSINGS) ×4 IMPLANT
DEVICE DUBIN SPECIMEN MAMMOGRA (MISCELLANEOUS) ×4 IMPLANT
DRAPE CHEST BREAST 15X10 FENES (DRAPES) ×4 IMPLANT
DRAPE UTILITY XL STRL (DRAPES) ×8 IMPLANT
DRSG PAD ABDOMINAL 8X10 ST (GAUZE/BANDAGES/DRESSINGS) ×8 IMPLANT
ELECT COATED BLADE 2.86 ST (ELECTRODE) ×4 IMPLANT
ELECT REM PT RETURN 9FT ADLT (ELECTROSURGICAL) ×4
ELECTRODE REM PT RTRN 9FT ADLT (ELECTROSURGICAL) ×2 IMPLANT
GAUZE SPONGE 4X4 12PLY STRL (GAUZE/BANDAGES/DRESSINGS) ×8 IMPLANT
GLOVE BIO SURGEON STRL SZ7.5 (GLOVE) ×8 IMPLANT
GLOVE BIOGEL PI IND STRL 6.5 (GLOVE) ×4 IMPLANT
GLOVE BIOGEL PI INDICATOR 6.5 (GLOVE) ×4
GOWN STRL REUS W/ TWL LRG LVL3 (GOWN DISPOSABLE) ×4 IMPLANT
GOWN STRL REUS W/TWL LRG LVL3 (GOWN DISPOSABLE) ×4
KIT BASIN OR (CUSTOM PROCEDURE TRAY) ×4 IMPLANT
KIT MARKER MARGIN INK (KITS) ×4 IMPLANT
KIT ROOM TURNOVER OR (KITS) ×4 IMPLANT
LIQUID BAND (GAUZE/BANDAGES/DRESSINGS) ×4 IMPLANT
MARKER SKIN DUAL TIP RULER LAB (MISCELLANEOUS) ×4 IMPLANT
NEEDLE HYPO 25GX1X1/2 BEV (NEEDLE) ×4 IMPLANT
NS IRRIG 1000ML POUR BTL (IV SOLUTION) ×4 IMPLANT
PACK SURGICAL SETUP 50X90 (CUSTOM PROCEDURE TRAY) ×4 IMPLANT
PAD ARMBOARD 7.5X6 YLW CONV (MISCELLANEOUS) ×4 IMPLANT
PENCIL BUTTON HOLSTER BLD 10FT (ELECTRODE) ×4 IMPLANT
SPONGE LAP 18X18 X RAY DECT (DISPOSABLE) ×8 IMPLANT
SUT MNCRL AB 3-0 PS2 18 (SUTURE) ×12 IMPLANT
SUT MNCRL AB 4-0 PS2 18 (SUTURE) ×16 IMPLANT
SUT MON AB 4-0 PC3 18 (SUTURE) ×4 IMPLANT
SUT MON AB 5-0 PS2 18 (SUTURE) ×20 IMPLANT
SUT SILK 2 0 SH (SUTURE) IMPLANT
SUT VIC AB 3-0 SH 18 (SUTURE) ×4 IMPLANT
SYR BULB 3OZ (MISCELLANEOUS) ×4 IMPLANT
SYR CONTROL 10ML LL (SYRINGE) ×8 IMPLANT
TOWEL OR 17X24 6PK STRL BLUE (TOWEL DISPOSABLE) ×4 IMPLANT
TOWEL OR 17X26 10 PK STRL BLUE (TOWEL DISPOSABLE) ×4 IMPLANT
TUBE CONNECTING 12'X1/4 (SUCTIONS)
TUBE CONNECTING 12X1/4 (SUCTIONS) IMPLANT
YANKAUER SUCT BULB TIP NO VENT (SUCTIONS) IMPLANT

## 2015-01-28 NOTE — Anesthesia Preprocedure Evaluation (Signed)
Anesthesia Evaluation  Patient identified by MRN, date of birth, ID band Patient awake    Reviewed: Allergy & Precautions, NPO status , Patient's Chart, lab work & pertinent test results  Airway Mallampati: II  TM Distance: >3 FB Neck ROM: Full    Dental no notable dental hx.    Pulmonary asthma ,    Pulmonary exam normal breath sounds clear to auscultation       Cardiovascular hypertension, Pt. on medications Normal cardiovascular exam Rhythm:Regular Rate:Normal     Neuro/Psych PSYCHIATRIC DISORDERS Anxiety Depression Bipolar Disorder negative neurological ROS     GI/Hepatic Neg liver ROS, GERD  Medicated,  Endo/Other  negative endocrine ROS  Renal/GU negative Renal ROS     Musculoskeletal  (+) Arthritis , Fibromyalgia -  Abdominal   Peds  Hematology  (+) anemia ,   Anesthesia Other Findings   Reproductive/Obstetrics negative OB ROS                             Anesthesia Physical Anesthesia Plan  ASA: II  Anesthesia Plan: General   Post-op Pain Management:    Induction: Intravenous  Airway Management Planned:   Additional Equipment:   Intra-op Plan:   Post-operative Plan: Extubation in OR  Informed Consent: I have reviewed the patients History and Physical, chart, labs and discussed the procedure including the risks, benefits and alternatives for the proposed anesthesia with the patient or authorized representative who has indicated his/her understanding and acceptance.   Dental advisory given  Plan Discussed with: CRNA  Anesthesia Plan Comments:         Anesthesia Quick Evaluation

## 2015-01-28 NOTE — Interval H&P Note (Signed)
History and Physical Interval Note:  01/28/2015 1:22 PM  Caitlyn Bautista  has presented today for surgery, with the diagnosis of LEFT BREAST DCIS  The various methods of treatment have been discussed with the patient and family. After consideration of risks, benefits and other options for treatment, the patient has consented to  Procedure(s): LEFT BREAST LUMPECTOMY WITH NEEDLE LOCALIZATION (Left) as a surgical intervention .  The patient's history has been reviewed, patient examined, no change in status, stable for surgery.  I have reviewed the patient's chart and labs.  Questions were answered to the patient's satisfaction.     TOTH III,Journee Kohen S

## 2015-01-28 NOTE — Op Note (Signed)
01/28/2015  4:22 PM  PATIENT:  Caitlyn Bautista  64 y.o. female  PRE-OPERATIVE DIAGNOSIS:  LEFT BREAST DCIS  POST-OPERATIVE DIAGNOSIS:  LEFT BREAST DCIS  PROCEDURE:  Procedure(s): LEFT BREAST LUMPECTOMY WITH NEEDLE LOCALIZATION (Left)  SURGEON:  Surgeon(s) and Role:     Jovita Kussmaul, MD   PHYSICIAN ASSISTANT:   ASSISTANTS: Dr. Marla Roe   ANESTHESIA:   general  EBL:  Total I/O In: 1000 [I.V.:1000] Out: -   BLOOD ADMINISTERED:none  DRAINS: none   LOCAL MEDICATIONS USED:  MARCAINE     SPECIMEN:  Source of Specimen:  left breast tissue with additional anterior margin  DISPOSITION OF SPECIMEN:  PATHOLOGY  COUNTS:  YES  TOURNIQUET:  * No tourniquets in log *  DICTATION: .Dragon Dictation   After informed consent was obtained the patient was brought to the operating room and placed in the supine position on the operating table. After adequate induction of general anesthesia the patient's left breast was prepped with ChloraPrep, allowed to dry, and draped in usual sterile manner. Earlier in the day the patient underwent a wire localization procedure and the wire was entering the left breast medially.  At this point the incisions for the breast reduction were made by Dr. Marla Roe. Once the dissection reached the area of the wire then a fairly large portion of tissue was removed from the medial breast which included the area of the wire. The specimen was then marked with the appropriate paint colors. A specimen radiograph was obtained that showed the clip and wire to be in the specimen. An additional anterior margin was taken sharply with Metzenbaum scissors. This was marked with ankle on the new anterior margin. This was sent to pathology as well. At this point the rest of the reduction surgery was performed by Dr. Marla Roe and this portion of the procedure will be dictated separately. At the end of the case all needle spongecounts were correct. The patient tolerated the procedure  well. The patient was then awakened and taken to recovery in stable condition.  PLAN OF CARE: Discharge to home after PACU  PATIENT DISPOSITION:  PACU - hemodynamically stable.   Delay start of Pharmacological VTE agent (>24hrs) due to surgical blood loss or risk of bleeding: not applicable

## 2015-01-28 NOTE — Anesthesia Procedure Notes (Signed)
Procedure Name: Intubation Date/Time: 01/28/2015 1:58 PM Performed by: Tressia Miners LEFFEW Pre-anesthesia Checklist: Patient identified, Patient being monitored, Timeout performed, Emergency Drugs available and Suction available Patient Re-evaluated:Patient Re-evaluated prior to inductionOxygen Delivery Method: Circle System Utilized Preoxygenation: Pre-oxygenation with 100% oxygen Intubation Type: IV induction Ventilation: Two handed mask ventilation required and Oral airway inserted - appropriate to patient size Laryngoscope Size: 3 Grade View: Grade I Tube type: Oral Tube size: 7.5 mm Number of attempts: 1 Airway Equipment and Method: Stylet and Video-laryngoscopy Placement Confirmation: ETT inserted through vocal cords under direct vision,  positive ETCO2 and breath sounds checked- equal and bilateral Secured at: 21 cm Tube secured with: Tape Dental Injury: Teeth and Oropharynx as per pre-operative assessment

## 2015-01-28 NOTE — Anesthesia Postprocedure Evaluation (Signed)
Anesthesia Post Note  Patient: Caitlyn Bautista  Procedure(s) Performed: Procedure(s) (LRB): LEFT BREAST LUMPECTOMY WITH NEEDLE LOCALIZATION (Left) BREAST REDUCTION (Left)  Patient location during evaluation: PACU Anesthesia Type: General Level of consciousness: awake Pain management: pain level controlled Vital Signs Assessment: post-procedure vital signs reviewed and stable Respiratory status: spontaneous breathing Cardiovascular status: stable Postop Assessment: No signs of nausea or vomiting Anesthetic complications: no    Last Vitals:  Filed Vitals:   01/28/15 1741 01/28/15 1745  BP: 126/77   Pulse: 84 86  Temp:    Resp: 15 22    Last Pain:  Filed Vitals:   01/28/15 1826  PainSc: Asleep                 Cedrik Heindl

## 2015-01-28 NOTE — H&P (Signed)
Caitlyn Bautista 12/10/2014 8:30 AM Location: [facility] Patient #: [id number] DOB: 09/24/1950 Undefined / Language: Caitlyn Bautista / Race: Black or African American Female   History of Present Illness Caitlyn Bautista. Marlou Starks MD; 12/10/2014 3:48 PM) The patient is a 64 year old female who presents with breast cancer. We are asked to see the patient in consultation by Dr. Valere Dross to evaluate her for a left breast cancer. The patient is a 64 year old black female who recently went for a routine screening mammogram. At that time she was found to have a 5 mm area of abnormal appearing microcalcification in the lower inner quadrant of the left breast. 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 She was ER and PR positive. She denies any breast pain or discharge from the nipple. She does not take any hormone replacement. She does have a family history significant for breast cancer in a maternal aunt and ovarian cancer in 2 sisters and a grandmother. she still has one of her ovaries.   Other Problems Caitlyn Bautista, CMA; 12/10/2014 8:30 AM) Anxiety Disorder Arthritis Asthma Back Pain Bladder Problems Breast Cancer Chest pain Depression Gastroesophageal Reflux Disease Hemorrhoids High blood pressure Hypercholesterolemia Lump In Breast Migraine Headache Oophorectomy Left. Sleep Apnea Thyroid Disease  Past Surgical History Caitlyn Bautista, [place]; 12/10/2014 8:30 AM) Breast Biopsy Bilateral. multiple Hysterectomy (due to cancer) - Partial Oral Surgery  Diagnostic Studies History Caitlyn Bautista, [place]; 12/10/2014 8:30 AM) Colonoscopy 1-5 years ago Mammogram within last year Pap Smear 1-5 years ago  Medication History Caitlyn Bautista, [place]; 12/10/2014 8:30 AM) No Current Medications Medications Reconciled  Social History Caitlyn Bautista, [place]; 12/10/2014 8:30 AM) Alcohol use Moderate alcohol use. Caffeine use Carbonated beverages, Coffee, Tea. No drug  use Tobacco use Never smoker.  Family History Caitlyn Bautista, [place]; 12/10/2014 8:30 AM) Alcohol Abuse Family Members In General. Arthritis Daughter, Family Members In General, Mother, Sister. Bleeding disorder Daughter, Mother, Sister. Breast Cancer Family Members In General. Cancer Family Members In General. Cervical Cancer Family Members In General, Sister. Depression Daughter, Family Members In General, Mother. Diabetes Mellitus Family Members In General, Sister. Heart Disease Sister. Hypertension Daughter, Mother, Sister. Ischemic Bowel Disease Sister. Migraine Headache Daughter, Sister. Ovarian Cancer Sister. Respiratory Condition Family Members In General. Seizure disorder Brother.  Pregnancy / Birth History Caitlyn Bautista, [place]; 12/10/2014 8:30 AM) Age at menarche 47 years. Age of menopause <47 Contraceptive History Contraceptive implant, Intrauterine device, Oral contraceptives. Gravida 4 Irregular periods Maternal age 58-20 Para 4    Review of Systems Caitlyn Bautista CMA; 12/10/2014 8:30 AM) General Present- Chills, Fatigue and Night Sweats. Not Present- Appetite Loss, Fever, Weight Gain and Weight Loss. Skin Present- Change in Wart/Mole and Dryness. Not Present- Hives, Jaundice, New Lesions, Non-Healing Wounds, Rash and Ulcer. HEENT Present- Hearing Loss, Seasonal Allergies, Visual Disturbances and Wears glasses/contact lenses. Not Present- Earache, Hoarseness, Nose Bleed, Oral Ulcers, Ringing in the Ears, Sinus Pain, Sore Throat and Yellow Eyes. Respiratory Present- Difficulty Breathing and Snoring. Not Present- Bloody sputum, Chronic Cough and Wheezing. Breast Present- Breast Pain. Not Present- Breast Mass, Nipple Discharge and Skin Changes. Cardiovascular Present- Difficulty Breathing Lying Down and Leg Cramps. Not Present- Chest Pain, Palpitations, Rapid Heart Rate, Shortness of Breath and Swelling of Extremities. Gastrointestinal Present- Change  in Bowel Habits and Indigestion. Not Present- Abdominal Pain, Bloating, Bloody Stool, Chronic diarrhea, Constipation, Difficulty Swallowing, Excessive gas, Gets full quickly at meals, Hemorrhoids, Nausea, Rectal Pain and Vomiting. Female Genitourinary Present- Frequency, Nocturia and Urgency. Not  Present- Painful Urination and Pelvic Pain. Musculoskeletal Present- Back Pain, Joint Pain, Joint Stiffness, Muscle Pain and Muscle Weakness. Not Present- Swelling of Extremities. Neurological Present- Decreased Memory, Headaches, Numbness, Tremor, Trouble walking and Weakness. Not Present- Fainting, Seizures and Tingling. Psychiatric Present- Anxiety, Bipolar, Depression and Fearful. Not Present- Change in Sleep Pattern and Frequent crying. Endocrine Present- Hair Changes. Not Present- Cold Intolerance, Excessive Hunger, Heat Intolerance, Hot flashes and New Diabetes. Hematology Present- Excessive bleeding. Not Present- Easy Bruising, Gland problems, HIV and Persistent Infections.   Physical Exam [name] S. Marlou Starks MD; 12/10/2014 3:49 PM) General Mental Status-Alert. General Appearance-Consistent with stated age. Hydration-Well hydrated. Voice-Normal.  Head and Neck Head-normocephalic, atraumatic with no lesions or palpable masses. Trachea-midline. Thyroid Gland Characteristics - normal size and consistency.  Eye Eyeball - Bilateral-Extraocular movements intact. Sclera/Conjunctiva - Bilateral-No scleral icterus.  Chest and Lung Exam Chest and lung exam reveals -quiet, even and easy respiratory effort with no use of accessory muscles and on auscultation, normal breath sounds, no adventitious sounds and normal vocal resonance. Inspection Chest Wall - Normal. Back - normal.  Breast Note: There is no palpable mass in either breast. There is no palpable axillary, supraclavicular, or cervical lymphadenopathy.   Cardiovascular Cardiovascular examination reveals -normal heart  sounds, regular rate and rhythm with no murmurs and normal pedal pulses bilaterally.  Abdomen Inspection Inspection of the abdomen reveals - No Hernias. Skin - Scar - no surgical scars. Palpation/Percussion Palpation and Percussion of the abdomen reveal - Soft, Non Tender, No Rebound tenderness, No Rigidity (guarding) and No hepatosplenomegaly. Auscultation Auscultation of the abdomen reveals - Bowel sounds normal.  Neurologic Neurologic evaluation reveals -alert and oriented x 3 with no impairment of recent or remote memory. Mental Status-Normal.  Musculoskeletal Normal Exam - Left-Upper Extremity Strength Normal and Lower Extremity Strength Normal. Normal Exam - Right-Upper Extremity Strength Normal and Lower Extremity Strength Normal.  Lymphatic Head & Neck  General Head & Neck Lymphatics: Bilateral - Description - Normal. Axillary  General Axillary Region: Bilateral - Description - Normal. Tenderness - Non Tender. Femoral & Inguinal  Generalized Femoral & Inguinal Lymphatics: Bilateral - Description - Normal. Tenderness - Non Tender.    Assessment & Plan [name] S. Marlou Starks MD; 12/10/2014 3:51 PM) DCIS (DUCTAL CARCINOMA IN SITU), LEFT (D05.12) Impression: The patient appears to have a small area of DCIS in the lower inner quadrant of the left breast. I have talked to her in detail about the different options for treatment and at this point she favors breast conservation. She is having genetic testing done and states that if she comes back genetically positive that this would change what she would like to do and she would like to consider bilateral mastectomies. If she is genetically negative then she would prefer breast conservation and would need localization for the surgery. This could be done with a radioactive seed localized lumpectomy. She will meet with genetics in the near future and we will go ahead and refer her to plastic surgery to talk about reconstruction. We will  call her once we have her genetics testing back and then plan for surgery at that point. Current Plans Pt Education - Breast Cancer: discussed with patient and provided information.  She has decided on left partial mastectomy with reduction Signed by Luella Cook, MD (12/10/2014 3:51 PM)

## 2015-01-28 NOTE — Transfer of Care (Signed)
Immediate Anesthesia Transfer of Care Note  Patient: Caitlyn Bautista  Procedure(s) Performed: Procedure(s): LEFT BREAST LUMPECTOMY WITH NEEDLE LOCALIZATION (Left) BREAST REDUCTION (Left)  Patient Location: PACU  Anesthesia Type:General  Level of Consciousness: lethargic and responds to stimulation  Airway & Oxygen Therapy: Patient Spontanous Breathing and Patient connected to nasal cannula oxygen  Post-op Assessment: Report given to RN  Post vital signs: Reviewed and stable  Last Vitals:  Filed Vitals:   01/28/15 1116  BP: 122/59  Pulse: 76  Temp: 37.2 C  Resp: 18    Complications: No apparent anesthesia complications

## 2015-01-28 NOTE — H&P (Signed)
Caitlyn Bautista is an 64 y.o. female.   Chief Complaint: left breast cancer HPI: The patient is a 64 yrs old bf here for treatment of left breast cancer with immediate reconstruction. She was diagnosed with left breast lower inner quadrant DCIS. She had a screening mammogram 11/18/14 and a left breast mass with calcifications was detected at the 8 o'clock position. The biopsy (9/27) showed intermediate grade DCIS with calcifications, ER/PR positive. She is waiting on genetics. She is being seen by Dr. Marlou Starks and Dr. Lindi Adie. She had a right breast lumpectomy in 1970s which she reports as negative for cancer. She is currently disabled. She is 5 feet 7 inches tall, weighs 280 pounds and preop Bra = 42 DDD. Her sternal notch to NAC on the right is 35 and 34 cm on the left the IMF to NAC on the right is 9 and 12 on the left.   Past Medical History  Diagnosis Date  . PTSD (post-traumatic stress disorder)   . Anxiety   . Fibromyalgia   . Arthritis   . Hypertension   . Thyroid disease     "very mild"  . Carpal tunnel syndrome, bilateral   . Asthma   . History of chicken pox   . Hyperlipidemia     pt. denies  . Incontinence of urine in female   . Arachnoiditis   . Cancer of lower-inner quadrant of female breast ([place]) 12/05/2014  . Carpal tunnel syndrome   . Scoliosis   . Allergy   . Depression     bipolar depression  . Bipolar disorder ([place])   . Cervical herniated disc   . History of pneumonia 1996  . History of bronchitis   . Urinary frequency   . Urinary urgency   . Anemia     Past Surgical History  Procedure Laterality Date  . Tubal ligation    . Abdominal hysterectomy  1990's  . Dilation and curettage of uterus    . Breast lumpectomy  1970's    right  . Colonoscopy      Family History  Problem Relation Age of Onset  . Arthritis Other     Parent  . Hypertension Other     Parent  . Arthritis Other     Grandparent  . Hypertension Other     Grandparent  . Arthritis Other      Other Blood Relative  . Hypertension Other     Other Blood Relative  . Diabetes Other     Other Blood Relative  . Cancer Mother     unspecified type; dx. 5 or younger  . Ovarian cancer Sister 75  . Breast cancer Maternal Aunt     bilateral - dx. 30s, 64s  . Ovarian cancer Maternal Grandmother 100  . Ovarian cancer Sister 74  . Cervical cancer Sister 65  . Pancreatic cancer Maternal Uncle     dx. 50s-60s  . Cancer Cousin     either ovarian or cervical cancer  . Cancer Daughter     hx of TAH-BSO due to fibroids and bleeding   Social History:  reports that she has never smoked. She has never used smokeless tobacco. She reports that she drinks alcohol. She reports that she does not use illicit drugs.  Allergies:  Allergies  Allergen Reactions  . Ace Inhibitors Other (See Comments) and Nausea And Vomiting    Cough, asthma attack  . Other     allergic to dust mites; grass and various others  .  Decongestant [Pseudoephedrine Hcl] Other (See Comments) and Rash    High blood pressure  . Nsaids Swelling and Rash    Medications Prior to Admission  Medication Sig Dispense Refill  . amLODipine (NORVASC) 10 MG tablet Take 1 tablet (10 mg total) by mouth daily. For hypertension. 90 tablet 3  . azithromycin (ZITHROMAX) 500 MG tablet Take 1 tablet (500 mg total) by mouth daily. 3 tablet 0  . buPROPion (WELLBUTRIN XL) 150 MG 24 hr tablet Take 1 tablet (150 mg total) by mouth daily. 30 tablet 0  . diazepam (VALIUM) 5 MG tablet Take 1 tablet (5 mg total) by mouth 3 (three) times daily as needed for anxiety. 30 tablet 0  . DULoxetine (CYMBALTA) 60 MG capsule Take 60 mg by mouth 2 (two) times daily.     . fexofenadine (ALLEGRA) 180 MG tablet Take 1 tablet (180 mg total) by mouth daily. For seasonal allergies.    . fluticasone (FLONASE) 50 MCG/ACT nasal spray Place 2 sprays into both nostrils daily. For seasonal allergies. (Patient taking differently: Place 2 sprays into both nostrils daily as  needed for allergies. For seasonal allergies.) 16 g 0  . HYDROcodone-acetaminophen (NORCO) 10-325 MG per tablet Take 1 tablet by mouth every 8 (eight) hours as needed. 90 tablet 0  . lithium carbonate 300 MG capsule Take two capsules (639m) by mouth with breakfast. Take 3 capsules (9079m with dinner for mood stabilization. (Patient taking differently: Take 300 mg by mouth 2 (two) times daily with a meal. ) 150 capsule 0  . mineral oil liquid Take 15 mLs by mouth daily as needed for moderate constipation.    . ranitidine (ZANTAC) 75 MG tablet Take 1 tablet (75 mg total) by mouth 2 (two) times daily.    . Marland Kitchenlbuterol (PROVENTIL HFA;VENTOLIN HFA) 108 (90 BASE) MCG/ACT inhaler Inhale 2 puffs into the lungs daily. For wheezing. 1 Inhaler 0  . pregabalin (LYRICA) 75 MG capsule Take 1 capsule (75 mg total) by mouth 2 (two) times daily. 60 capsule 2    No results found for this or any previous visit (from the past 48 hour(s)). [facility] Breast Loc Dev   1st Lesion  Inc Mammo Guide  01/28/2015  CLINICAL DATA:  Preoperative localization for removal of left breast lesion. EXAM: NEEDLE LOCALIZATION OF THE LEFT BREAST WITH MAMMO GUIDANCE COMPARISON:  Previous exams. FINDINGS: Patient presents for needle localization prior to surgical excision of left breast lesion. I met with the patient and we discussed the procedure of needle localization including benefits and alternatives. We discussed the high likelihood of a successful procedure. We discussed the risks of the procedure, including infection, bleeding, tissue injury, and further surgery. Informed, written consent was given. The usual time-out protocol was performed immediately prior to the procedure. Using mammographic guidance, sterile technique, 2% lidocaine and a 5 cm modified Kopans needle, the clip was localized using amedial approach. The images were marked for Dr. ToMarlou StarksIMPRESSION: Needle localization left breast. No apparent complications. Electronically  Signed   By: RaAltamese Cabal.D.   On: 01/28/2015 10:50    Review of Systems  Constitutional: Negative.   HENT: Negative.   Eyes: Negative.   Respiratory: Negative.   Cardiovascular: Negative.   Gastrointestinal: Negative.   Genitourinary: Negative.   Musculoskeletal: Negative.   Skin: Negative.   Neurological: Negative.   Psychiatric/Behavioral: Negative.     Blood pressure 122/59, pulse 76, temperature 99 F (37.2 C), temperature source Oral, resp. rate 18, height 5'  7" (1.702 m), weight 125.646 kg (277 lb), SpO2 100 %. Physical Exam  Constitutional: She is oriented to person, place, and time. She appears well-developed and well-nourished.  HENT:  Head: Normocephalic and atraumatic.  Eyes: Conjunctivae and EOM are normal. Pupils are equal, round, and reactive to light.  Cardiovascular: Normal rate.   Respiratory: Effort normal.  GI: Soft.  Neurological: She is alert and oriented to person, place, and time.  Skin: Skin is warm.  Psychiatric: She has a normal mood and affect. Her behavior is normal. Judgment and thought content normal.     Assessment/Plan Plan for immediate reconstruction with reduction of the left breast.  The right will be reduced once she is finished with radiation.  Wallace Going 01/28/2015, 1:43 PM

## 2015-01-28 NOTE — Brief Op Note (Signed)
01/28/2015  1:51 PM  PATIENT:  Caitlyn Bautista  64 y.o. female  PRE-OPERATIVE DIAGNOSIS:  LEFT BREAST DCIS  POST-OPERATIVE DIAGNOSIS:  * No post-op diagnosis entered *  PROCEDURE:  Procedure(s): LEFT BREAST LUMPECTOMY WITH NEEDLE LOCALIZATION (Left) BREAST REDUCTION (Left)  SURGEON:  Surgeon(s) and Role: Panel 1:    * Jovita Kussmaul, MD - Primary  Panel 2:    * Loel Lofty Dhyan Noah, DO - Primary  PHYSICIAN ASSISTANT: none  ASSISTANTS: none   ANESTHESIA:   general  EBL:     BLOOD ADMINISTERED:none  DRAINS: none   LOCAL MEDICATIONS USED:  MARCAINE    and LIDOCAINE   SPECIMEN:  Source of Specimen:  breast tissue from left  DISPOSITION OF SPECIMEN:  PATHOLOGY  COUNTS:  YES  TOURNIQUET:  * No tourniquets in log *  DICTATION: .Dragon Dictation  PLAN OF CARE: Discharge to home after PACU  PATIENT DISPOSITION:  PACU - hemodynamically stable.   Delay start of Pharmacological VTE agent (>24hrs) due to surgical blood loss or risk of bleeding: no

## 2015-01-28 NOTE — Op Note (Signed)
Breast Reduction Op note:    DATE OF PROCEDURE: 01/28/2015  LOCATION: [facility] outpatient  SURGEON: Dr. Lyndee Leo Sanger  ASSISTANT: Dr. Lorine Bears  PREOPERATIVE DIAGNOSIS 1. Left breast cancer 2. Macromastia 3. Neck Pain /  Back Pain  POSTOPERATIVE DIAGNOSIS 1. Left breast cancer 2. Macromastia 3 .Neck Pain /  Back Pain  PROCEDURES 1. Immediate left breast reconstruction with reduction.  Left reduction [id number]  COMPLICATIONS: None.  DRAINS: none  INDICATIONS FOR PROCEDURE The patient is a 64 y.o. year-old female with a history of symptomatic macromastia with concominant back pain, neck pain, shoulder grooving from her bra.    CONSENT Informed consent was obtained directly from the patient. The risks, benefits and alternatives were fully discussed. Specific risks including but not limited to bleeding, infection, hematoma, seroma, scarring, pain, nipple necrosis, asymmetry, poor cosmetic results, and need for further surgery were discussed. The patient had ample opportunity to have her questions answered to her satisfaction.  DESCRIPTION OF PROCEDURE  Patient was brought into the operating room and placed in a supine position.  SCDs were placed and appropriate padding was performed.  Antibiotics were given. The patient underwent general anesthesia and the chest was prepped and draped in a sterile fashion.  A timeout was performed and all information was confirmed to be correct. General surgery performed the partial mastectomy of the medial aspect of the left breast.  Preoperative markings were confirmed.  Incision lines were injected with 1% Xylocaine with epinephrine.  After waiting for vasoconstriction, the marked lines were incised.  An inferior pedical breast reduction was performed by de-epithelializing the pedicle, using bovie to create the lateral and medial pedicles, and removing breast tissue from the superior and lateral portions of the breast. The medial aspect was  resected by general surgery as it had a needle placed for the area of the partial mastectomy. Care was taken to not undermine the breast pedicle. Hemostasis was achieved.  The nipple was gently rotated into position and the deep layers closed with 3-0 Monocryl.  The patient was sat upright and size and shape were evaluated.  The pocket was irrigated and hemostasis confirmed.  The deep tissues were approximated with 3-0 Monocryl sutures and the skin was closed with deep dermal and subcuticular 4-0 Monocryl sutures followed by 5-0 Monocryl.  The nipple areola complex was brought out with the skin de-epithelialized at the location to make place for the complex.  The area was secured with 4-0 Monocryl at the deep layers followed by 5-0 Monocryl.  The nipple and skin flaps had good capillary refill at the end of the procedure. The patient tolerated the procedure well. Dermabond and a breast binder was placed. The patient was allowed to wake from anesthesia and taken to the recovery room in satisfactory condition.

## 2015-02-02 ENCOUNTER — Encounter (HOSPITAL_COMMUNITY): Payer: Self-pay | Admitting: General Surgery

## 2015-02-05 NOTE — Discharge Summary (Signed)
Physician Discharge Summary  Patient ID: Caitlyn Bautista MRN: [id number] DOB/AGE: June 24, 1950 64 y.o.  Admit date: 01/28/2015 Discharge date: 02/05/2015  Admission Diagnoses: left breast cancer  Discharge Diagnoses:  Active Problems:   * No active hospital problems. *   Discharged Condition: good  Hospital Course: The patient was taken to the OR for left breast partial mastectomy with immediate reconstruction with reduction.  Consults: None  Significant Diagnostic Studies: none  Treatments: surgery  Discharge Exam: Blood pressure 126/77, pulse 86, temperature 97.6 F (36.4 C), temperature source Oral, resp. rate 22, height 5\' 7"  (1.702 m), weight 125.646 kg (277 lb), SpO2 94 %. General appearance: alert, cooperative, no distress and tolerating liquids well. Head: Normocephalic, without obvious abnormality, atraumatic Incision/Wound: intact.  Disposition: 01-Home or Self Care  Discharge Instructions    Diet - low sodium heart healthy    Complete by:  As directed      Increase activity slowly    Complete by:  As directed             Medication List    TAKE these medications        albuterol 108 (90 BASE) MCG/ACT inhaler  Commonly known as:  PROVENTIL HFA;VENTOLIN HFA  Inhale 2 puffs into the lungs daily. For wheezing.     amLODipine 10 MG tablet  Commonly known as:  NORVASC  Take 1 tablet (10 mg total) by mouth daily. For hypertension.     azithromycin 500 MG tablet  Commonly known as:  ZITHROMAX  Take 1 tablet (500 mg total) by mouth daily.     buPROPion 150 MG 24 hr tablet  Commonly known as:  WELLBUTRIN XL  Take 1 tablet (150 mg total) by mouth daily.     cephALEXin 500 MG capsule  Commonly known as:  KEFLEX  Take 1 capsule (500 mg total) by mouth 4 (four) times daily.     diazepam 5 MG tablet  Commonly known as:  VALIUM  Take 1 tablet (5 mg total) by mouth 3 (three) times daily as needed for anxiety.     DULoxetine 60 MG capsule  Commonly known as:   CYMBALTA  Take 60 mg by mouth 2 (two) times daily.     fexofenadine 180 MG tablet  Commonly known as:  ALLEGRA  Take 1 tablet (180 mg total) by mouth daily. For seasonal allergies.     fluticasone 50 MCG/ACT nasal spray  Commonly known as:  FLONASE  Place 2 sprays into both nostrils daily. For seasonal allergies.     HYDROcodone-acetaminophen 10-325 MG tablet  Commonly known as:  NORCO  Take 1 tablet by mouth every 8 (eight) hours as needed.     HYDROcodone-acetaminophen 5-325 MG tablet  Commonly known as:  NORCO/VICODIN  Take 1-2 tablets by mouth every 4 (four) hours as needed for moderate pain or severe pain.     lithium carbonate 300 MG capsule  Take two capsules (600mg ) by mouth with breakfast. Take 3 capsules (900mg ) with dinner for mood stabilization.     mineral oil liquid  Take 15 mLs by mouth daily as needed for moderate constipation.     pregabalin 75 MG capsule  Commonly known as:  LYRICA  Take 1 capsule (75 mg total) by mouth 2 (two) times daily.     ranitidine 75 MG tablet  Commonly known as:  ZANTAC  Take 1 tablet (75 mg total) by mouth 2 (two) times daily.  Follow-up Information    Follow up with Merrie Roof, MD In 2 weeks.   Specialty:  General Surgery   Contact information:   [address] [place] [address] [phone]       Follow up with Wallace Going, DO In 1 week.   Specialty:  Plastic Surgery   Contact information:   [place] [address] [id number]       Signed: Wallace Going 02/05/2015, 8:06 PM

## 2015-02-07 ENCOUNTER — Encounter (HOSPITAL_COMMUNITY): Payer: Self-pay

## 2015-02-10 ENCOUNTER — Encounter: Payer: Self-pay | Admitting: Radiation Oncology

## 2015-02-10 NOTE — Progress Notes (Signed)
Location of Breast Cancer: Left Breast   Histology per Pathology Report: Left Breast 01/28/15   12/02/14 Diagnosis Breast, left, needle core biopsy, 8:00 o'clock - DUCTAL CARCINOMA IN SITU WITH CALCIFICATIONS. - SEE COMMENT. 1 of  Receptor Status: ER(100%), PR (100%), Her2-neu ()  Ms.Caitlyn Bautista - personal history of a benign breast biopsy in the right breast 1975. She had a screening mammogram detected left breast calcifications along with the mass. Around 8:00 position there were 5 mm span of calcifications. This was biopsied and came back as low to intermediate grade DCIS with calcifications   Past/Anticipated interventions by surgeon, if any:   Past/Anticipated interventions by medical oncology, if any: Antiestrogen therapy with tamoxifen 5 years following Radiation Therapy  Lymphedema issues, if RWE:RXVQ   Pain issues, if any: Tenderness of Left Breast  SAFETY ISSUES:  Prior radiation? No  Pacemaker/ICD? No  Possible current pregnancy?N/A  Is the patient on methotrexate? No  Current Complaints / other details:    She menarched at early age of 69 years and had hysterectomy but most likely she went into menopause 5 years ago  She had 4 pregnancy, her first child was born at age 18  She has received birth control pills for approximately 1-2 years.  She was never exposed to fertility medications or hormone replacement therapy.  She has no family history of Breast/GYN/GI cancer    Deirdre Evener, RN 02/10/2015,7:02 PM

## 2015-02-11 ENCOUNTER — Ambulatory Visit
Admission: RE | Admit: 2015-02-11 | Discharge: 2015-02-11 | Disposition: A | Discharge status: Home / Self Care | Payer: Medicare Other | Source: Ambulatory Visit | Attending: Radiation Oncology | Admitting: Radiation Oncology

## 2015-02-11 ENCOUNTER — Encounter: Payer: Self-pay | Admitting: Hematology and Oncology

## 2015-02-11 ENCOUNTER — Encounter: Payer: Self-pay | Admitting: Radiation Oncology

## 2015-02-11 ENCOUNTER — Ambulatory Visit (HOSPITAL_BASED_OUTPATIENT_CLINIC_OR_DEPARTMENT_OTHER): Payer: Medicare Other | Admitting: Hematology and Oncology

## 2015-02-11 VITALS — BP 159/84 | HR 76 | Temp 98.4°F | Resp 18 | Ht 67.0 in | Wt 276.4 lb

## 2015-02-11 VITALS — BP 122/63 | HR 75 | Temp 97.9°F | Ht 67.0 in | Wt 281.5 lb

## 2015-02-11 DIAGNOSIS — C50312 Malignant neoplasm of lower-inner quadrant of left female breast: Secondary | ICD-10-CM | POA: Insufficient documentation

## 2015-02-11 DIAGNOSIS — Z510 Encounter for antineoplastic radiation therapy: Secondary | ICD-10-CM | POA: Insufficient documentation

## 2015-02-11 DIAGNOSIS — Z170 Estrogen receptor positive status [ER+]: Secondary | ICD-10-CM | POA: Diagnosis not present

## 2015-02-11 DIAGNOSIS — D0512 Intraductal carcinoma in situ of left breast: Secondary | ICD-10-CM | POA: Diagnosis not present

## 2015-02-11 NOTE — Addendum Note (Signed)
Encounter addended by: Benn Moulder, RN on: 02/11/2015  5:55 PM<BR>     Documentation filed: Charges VN

## 2015-02-11 NOTE — Progress Notes (Addendum)
CC: Dr. [name], Dr. Pricilla Holm, Dr. Eppie Gibson, Dr. Nicholas Lose, Dr. Audelia Hives  Follow-up note:  Diagnosis: Stage 0 (Tis N0 M0) DCIS of the left breast  History: Caitlyn Bautista is a pleasant 64 year old female who is seen today for review and scheduling of her radiation therapy in the management of her DCIS of the left breast.  I first saw the patient at the breast multidisciplinary clinic on 12/10/2014. At the time of a screening mammogram on 11/18/2014 she was found to have a possible left breast mass with calcifications. Additional views on September 21 showed calcifications over an area of 5 mm within the lower inner quadrant of the left breast at approximately 8:00. A biopsy on 12/02/2014 was diagnostic for intermediate grade DCIS with calcifications. Her DCIS is ER and PR positive, both at 100%. She was seen by Dr. Audelia Hives as well, and she underwent a left partial mastectomy with immediate left reconstruction with reduction mammoplasty on 01/28/2015.  On review of her pathology she was found to have a 1.8 cm area of DCIS with the closest margin being anterior at 0.3 cm.  Further excision of the anterior margin was without evidence for persistent disease.  Margins were felt to be at least 2 cm.  Her DCIS was intermediate grade.  She is doing well postoperatively and she is pleased with her cosmesis.  Physical examination: Alert and oriented. Filed Vitals:   02/11/15 1551  BP: 122/63  Pulse: 75  Temp: 97.9 F (36.6 C)   Nodes: There is no palpable cervical, supraclavicular, or axillary lymphadenopathy.  Breasts: Her left reduction mammoplasty wounds are healing well with skin glue.  No masses are appreciated.  Extremities: Without edema.  Impression: Stage 0 (Tis N0 M0) intermediate grade DCIS of the left breast.  We discussed the role of adjuvant radiation therapy.  We discussed the potential acute and late toxicities of radiation therapy.  We also  discussed fractionation schedules and also possible deep inspiration breath-hold to avoid cardiac toxicity.  I would like to obtain a baseline [facility] to confirm removal of all suspicious microcalcifications, and this will serve as a baseline study.  With her reduction mammoplasty I feel that we need to wait until early January before proceeding with mammography at the [facility].  This will be followed by CT simulation and the start of radiation therapy towards mid to late January.  With my retirement, Dr. Isidore Moos will assume her care beginning with CT simulation.  Based on her anatomy she should undergo standard fractionation to her left breast, no boost.  Consent is signed today.  Lastly, antiestrogen therapy will be considered following completion of radiation therapy.  Plan: As above.  40 minutes was spent face-to-face the patient, primarily counseling patient and coordinating her care.

## 2015-02-11 NOTE — Progress Notes (Signed)
Patient Care Team: Hoyt Koch, MD as PCP - General (Internal Medicine) [name], MD as Consulting Physician (General Surgery) Nicholas Lose, MD as Consulting Physician (Hematology and Oncology) Arloa Koh, MD as Consulting Physician (Radiation Oncology) Mauro Kaufmann, RN as Registered Nurse Rockwell Germany, RN as Registered Nurse Sylvan Cheese, NP as Nurse Practitioner (Nurse Practitioner)  DIAGNOSIS: Primary cancer of lower-inner quadrant of left female breast [facility])   Staging form: Breast, AJCC 7th Edition     Clinical stage from 12/10/2014: Stage 0 (Tis (DCIS), N0, M0) - Unsigned   SUMMARY OF ONCOLOGIC HISTORY:   Primary cancer of lower-inner quadrant of left female breast ([place])   11/26/2014 Mammogram Left mammogram 8:00 calcifications spanning 5 mm   12/02/2014 Initial Diagnosis Left breast biopsy: 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 She is slightly sore from recent surgery but otherwise healing well. She feels very tired.  REVIEW OF SYSTEMS:   Constitutional: Denies fevers, chills or abnormal weight loss Eyes: Denies blurriness of vision Ears, nose, mouth, throat, and face: Denies mucositis or sore throat Respiratory: Denies cough, dyspnea or wheezes Cardiovascular: Denies palpitation, chest discomfort or lower extremity swelling Gastrointestinal:  Denies nausea, heartburn or change in bowel habits Skin: Denies abnormal skin rashes Lymphatics: Denies new lymphadenopathy or easy  bruising Neurological:Denies numbness, tingling or new weaknesses Behavioral/Psych: Mood is stable, no new changes  Breast: discomfort from recent surgery All other systems were reviewed with the patient and are negative.  I have reviewed the past medical history, past surgical history, social history and family history with the patient and they are unchanged from previous note.  ALLERGIES:  is allergic to ace inhibitors; other; decongestant; and nsaids.  MEDICATIONS:  Current Outpatient Prescriptions  Medication Sig Dispense Refill  . albuterol (PROVENTIL HFA;VENTOLIN HFA) 108 (90 BASE) MCG/ACT inhaler Inhale 2 puffs into the lungs daily. For wheezing. 1 Inhaler 0  . amLODipine (NORVASC) 10 MG tablet Take 1 tablet (10 mg total) by mouth daily. For hypertension. 90 tablet 3  . azithromycin (ZITHROMAX) 500 MG tablet Take 1 tablet (500 mg total) by mouth daily. 3 tablet 0  . buPROPion (WELLBUTRIN XL) 150 MG 24 hr tablet Take 1 tablet (150 mg total) by mouth daily. 30 tablet 0  . cephALEXin (KEFLEX) 500 MG capsule Take 1 capsule (500 mg total) by mouth 4 (four) times daily. 40 capsule 0  . diazepam (VALIUM) 5 MG tablet Take 1 tablet (5 mg total) by mouth 3 (three) times daily as needed for anxiety. 30 tablet 0  . DULoxetine (CYMBALTA) 60 MG capsule Take 60 mg by mouth 2 (two) times daily.     . fexofenadine (ALLEGRA) 180 MG tablet Take 1 tablet (180 mg total) by mouth daily. For seasonal allergies.    . fluticasone (FLONASE) 50 MCG/ACT nasal spray Place 2 sprays into both nostrils daily. For seasonal allergies. (Patient taking differently: Place 2 sprays into both nostrils daily as needed for allergies. For seasonal allergies.) 16 g 0  . HYDROcodone-acetaminophen (NORCO) 10-325 MG per tablet Take 1 tablet by mouth  every 8 (eight) hours as needed. 90 tablet 0  . HYDROcodone-acetaminophen (NORCO/VICODIN) 5-325 MG tablet Take 1-2 tablets by mouth every 4 (four) hours as needed for moderate pain or  severe pain. 50 tablet 0  . lithium carbonate 300 MG capsule Take two capsules (600mg ) by mouth with breakfast. Take 3 capsules (900mg ) with dinner for mood stabilization. (Patient taking differently: Take 300 mg by mouth 2 (two) times daily with a meal. ) 150 capsule 0  . mineral oil liquid Take 15 mLs by mouth daily as needed for moderate constipation.    . pregabalin (LYRICA) 75 MG capsule Take 1 capsule (75 mg total) by mouth 2 (two) times daily. 60 capsule 2  . ranitidine (ZANTAC) 75 MG tablet Take 1 tablet (75 mg total) by mouth 2 (two) times daily.     No current facility-administered medications for this visit.    PHYSICAL EXAMINATION: ECOG PERFORMANCE STATUS: 1 - Symptomatic but completely ambulatory  Filed Vitals:   02/11/15 1112  BP: 159/84  Pulse: 76  Temp: 98.4 F (36.9 C)  Resp: 18   Filed Weights   02/11/15 1112  Weight: 276 lb 6.4 oz (125.374 kg)    GENERAL:alert, no distress and comfortable SKIN: skin color, texture, turgor are normal, no rashes or significant lesions EYES: normal, Conjunctiva are pink and non-injected, sclera clear OROPHARYNX:no exudate, no erythema and lips, buccal mucosa, and tongue normal  NECK: supple, thyroid normal size, non-tender, without nodularity LYMPH:  no palpable lymphadenopathy in the cervical, axillary or inguinal LUNGS: clear to auscultation and percussion with normal breathing effort HEART: regular rate & rhythm and no murmurs and no lower extremity edema ABDOMEN:abdomen soft, non-tender and normal bowel sounds Musculoskeletal:no cyanosis of digits and no clubbing  NEURO: alert & oriented x 3 with fluent speech, no focal motor/sensory deficits  LABORATORY DATA:  I have reviewed the data as listed   Chemistry      Component Value Date/Time   NA 140 01/21/2015 1200   NA 143 12/10/2014 1223   K 4.2 01/21/2015 1200   K 3.5 12/10/2014 1223   CL 107 01/21/2015 1200   CO2 26 01/21/2015 1200   CO2 27 12/10/2014 1223   BUN  10 01/21/2015 1200   BUN 12.1 12/10/2014 1223   CREATININE 0.92 01/21/2015 1200   CREATININE 0.8 12/10/2014 1223      Component Value Date/Time   CALCIUM 9.4 01/21/2015 1200   CALCIUM 8.8 12/10/2014 1223   ALKPHOS 115 12/10/2014 1223   ALKPHOS 127* 04/11/2014 1017   AST 14 12/10/2014 1223   AST 12 04/11/2014 1017   ALT 14 12/10/2014 1223   ALT 15 04/11/2014 1017   BILITOT <0.30 12/10/2014 1223   BILITOT 0.3 04/11/2014 1017       Lab Results  Component Value Date   WBC 9.5 01/21/2015   HGB 11.7* 01/21/2015   HCT 37.0 01/21/2015   MCV 87.1 01/21/2015   PLT 366 01/21/2015   NEUTROABS 2.9 12/10/2014   ASSESSMENT & PLAN:  Primary cancer of lower-inner quadrant of left female breast ([place]) left lumpectomy 01/28/2015: DCIS 1.8 cm, margins negative, closest margin 0.3 cm, ALH,fibrocystic changes with calcifications, Tis N0 stage 0, ER 100%, PR 100% low to intermediate grade  Pathology review: We discussed the pathology report in great detail explaining the tumor size as well as the margin status along with the ER/PR receptors. I provided them with a copy of this report.  Recommendation: 1. Adjuvant radiation therapy, patient has an  appointment 02/12/2015 3. Followed by antiestrogen therapy with tamoxifen 5 years  Return to clinic after radiation is completed to start antiestrogen therapy with tamoxifen.    No orders of the defined types were placed in this encounter.   The patient has a good understanding of the overall plan. she agrees with it. she will call with any problems that may develop before the next visit here.   Rulon Eisenmenger, MD 02/11/2015

## 2015-02-11 NOTE — Addendum Note (Signed)
Encounter addended by: Arloa Koh, MD on: 02/11/2015  5:20 PM<BR>     Documentation filed: Dx Association, Flowsheet VN, Orders

## 2015-02-11 NOTE — Assessment & Plan Note (Signed)
left lumpectomy 01/28/2015: DCIS 1.8 cm, margins negative, closest margin 0.3 cm, ALH,fibrocystic changes with calcifications, Tis N0 stage 0, ER 100%, PR 100% low to intermediate grade  Pathology review: We discussed the pathology report in great detail explaining the tumor size as well as the margin status along with the ER/PR receptors. I provided them with a copy of this report.  Recommendation: 1. Adjuvant radiation therapy, patient has an appointment 02/12/2015 3. Followed by antiestrogen therapy with tamoxifen 5 years  Return to clinic after radiation is completed to start antiestrogen therapy with tamoxifen.

## 2015-02-12 ENCOUNTER — Telehealth: Payer: Self-pay | Admitting: *Deleted

## 2015-02-12 NOTE — Telephone Encounter (Signed)
Called patient to inform of mammogram on 03-12-15 - arrival time - 3:30 pm @ [facility], spoke with patient and she is aware of this test

## 2015-02-13 NOTE — Addendum Note (Signed)
Encounter addended by: Benn Moulder, RN on: 02/13/2015  7:03 PM<BR>     Documentation filed: [facility] Medal VN

## 2015-02-16 ENCOUNTER — Other Ambulatory Visit: Payer: Self-pay | Admitting: Family

## 2015-03-08 HISTORY — PX: REDUCTION MAMMAPLASTY: SUR839

## 2015-03-16 ENCOUNTER — Ambulatory Visit: Payer: Medicare Other | Admitting: Radiation Oncology

## 2015-03-20 ENCOUNTER — Telehealth: Payer: Self-pay | Admitting: Oncology

## 2015-03-20 NOTE — Telephone Encounter (Signed)
Kynzley called and said she has a mammogram scheduled for 03/27/15 before her CT SIM on 03/30/15.  She saw Dr. Marla Roe and was told she would not be able to tolerate a mammogram.  Dr. Marla Roe recommended a MRI or a CT scan instead.  Advised her that Dr. Isidore Moos will be in the office on Monday and will be notified.

## 2015-03-30 ENCOUNTER — Other Ambulatory Visit: Payer: Self-pay | Admitting: *Deleted

## 2015-03-30 ENCOUNTER — Ambulatory Visit
Admission: RE | Admit: 2015-03-30 | Discharge: 2015-03-30 | Disposition: A | Discharge status: Home / Self Care | Payer: Medicare Other | Source: Ambulatory Visit | Attending: Radiation Oncology | Admitting: Radiation Oncology

## 2015-03-30 DIAGNOSIS — C50312 Malignant neoplasm of lower-inner quadrant of left female breast: Secondary | ICD-10-CM

## 2015-03-30 DIAGNOSIS — Z510 Encounter for antineoplastic radiation therapy: Secondary | ICD-10-CM | POA: Diagnosis not present

## 2015-03-30 NOTE — Progress Notes (Signed)
  Radiation Oncology         (336) [phone] ________________________________  Name: Caitlyn Bautista MRN: [id number]  Date: 03/30/2015  DOB: 10/02/1950  SIMULATION AND TREATMENT PLANNING NOTE    Outpatient  DIAGNOSIS:     ICD-9-CM ICD-10-CM   1. Primary cancer of lower-inner quadrant of left female breast ([place]) 174.3 C50.312     NARRATIVE:  The patient did not undergo post operative mammogram as intended prior to simulation; her plastic surgeon, Dr. Marla Roe, was concerned this would be harmful to the patient's post operative tissues at this time.  Due to widely clear margins, the decision was made to proceed with simulation in absence of a baseline mammogram.  She was brought to the [facility].  Identity was confirmed.  All relevant records and images related to the planned course of therapy were reviewed.  The patient freely provided informed written consent to proceed with treatment after reviewing the details related to the planned course of therapy. The consent form was witnessed and verified by the simulation staff.    Then, the patient was set-up in a stable reproducible supine position for radiation therapy with her ipsilateral arm over her head, and her upper body secured in a custom-made Vac-lok device.  CT images were obtained.  Surface markings were placed.  The CT images were loaded into the planning software.    Patient had back pain and challenges holding still.  Breath hold technique was therefore not used.  TREATMENT PLANNING NOTE: Treatment planning then occurred.  Operative notes reviewed. The radiation prescription was entered and confirmed.     A total of 3 medically necessary complex treatment devices were fabricated and supervised by me: 2 fields with MLCs for custom blocks to protect heart, and lungs;  and, a Vac-lok. MORE COMPLEX DEVICES MAY BE MADE IN DOSIMETRY FOR FIELD IN FIELD BEAMS FOR DOSE HOMOGENEITY.  I have requested : 3D Simulation  I have  requested a DVH of the following structures: lungs, heart, bowel.    The patient will receive 50.4 Gy in 28 fractions to the left breast with 2 tangential fields.  This will not be followed by a boost.  Optical Surface Tracking Plan:  Since intensity modulated radiotherapy (IMRT) and 3D conformal radiation treatment methods are predicated on accurate and precise positioning for treatment, intrafraction motion monitoring is medically necessary to ensure accurate and safe treatment delivery. The ability to quantify intrafraction motion without excessive ionizing radiation dose can only be performed with optical surface tracking. Accordingly, surface imaging offers the opportunity to obtain 3D measurements of patient position throughout IMRT and 3D treatments without excessive radiation exposure. I am ordering optical surface tracking for this patient's upcoming course of radiotherapy.  ________________________________   Reference:  Ursula Alert, J, et al. Surface imaging-based analysis of intrafraction motion for breast radiotherapy patients.Journal of [place], n. 6, nov. 2014. ISSN [id number].  Available at: <http://www.jacmp.org/index.php/jacmp/article/view/4957>.    -----------------------------------  Eppie Gibson, MD

## 2015-03-31 ENCOUNTER — Telehealth: Payer: Self-pay | Admitting: Hematology and Oncology

## 2015-03-31 NOTE — Telephone Encounter (Signed)
Called and left a message with xrt follow up

## 2015-04-06 ENCOUNTER — Ambulatory Visit: Payer: Medicare Other | Admitting: Radiation Oncology

## 2015-04-06 DIAGNOSIS — Z510 Encounter for antineoplastic radiation therapy: Secondary | ICD-10-CM | POA: Diagnosis not present

## 2015-04-07 ENCOUNTER — Ambulatory Visit
Admission: RE | Admit: 2015-04-07 | Discharge: 2015-04-07 | Disposition: A | Discharge status: Home / Self Care | Payer: Medicare Other | Source: Ambulatory Visit | Attending: Radiation Oncology | Admitting: Radiation Oncology

## 2015-04-07 DIAGNOSIS — Z510 Encounter for antineoplastic radiation therapy: Secondary | ICD-10-CM | POA: Diagnosis not present

## 2015-04-08 ENCOUNTER — Ambulatory Visit
Admission: RE | Admit: 2015-04-08 | Discharge: 2015-04-08 | Disposition: A | Discharge status: Home / Self Care | Payer: Medicare Other | Source: Ambulatory Visit | Attending: Radiation Oncology | Admitting: Radiation Oncology

## 2015-04-08 DIAGNOSIS — Z510 Encounter for antineoplastic radiation therapy: Secondary | ICD-10-CM | POA: Diagnosis not present

## 2015-04-09 ENCOUNTER — Ambulatory Visit
Admission: RE | Admit: 2015-04-09 | Discharge: 2015-04-09 | Disposition: A | Discharge status: Home / Self Care | Payer: Medicare Other | Source: Ambulatory Visit | Attending: Radiation Oncology | Admitting: Radiation Oncology

## 2015-04-09 DIAGNOSIS — Z510 Encounter for antineoplastic radiation therapy: Secondary | ICD-10-CM | POA: Diagnosis not present

## 2015-04-10 ENCOUNTER — Ambulatory Visit
Admission: RE | Admit: 2015-04-10 | Discharge: 2015-04-10 | Disposition: A | Discharge status: Home / Self Care | Payer: Medicare Other | Source: Ambulatory Visit | Attending: Radiation Oncology | Admitting: Radiation Oncology

## 2015-04-10 DIAGNOSIS — Z510 Encounter for antineoplastic radiation therapy: Secondary | ICD-10-CM | POA: Diagnosis not present

## 2015-04-13 ENCOUNTER — Ambulatory Visit
Admission: RE | Admit: 2015-04-13 | Discharge: 2015-04-13 | Disposition: A | Discharge status: Home / Self Care | Payer: Medicare Other | Source: Ambulatory Visit | Attending: Radiation Oncology | Admitting: Radiation Oncology

## 2015-04-13 ENCOUNTER — Encounter: Payer: Self-pay | Admitting: Radiation Oncology

## 2015-04-13 VITALS — BP 126/77 | HR 79 | Temp 97.7°F | Ht 67.0 in | Wt 290.3 lb

## 2015-04-13 DIAGNOSIS — C50312 Malignant neoplasm of lower-inner quadrant of left female breast: Secondary | ICD-10-CM | POA: Insufficient documentation

## 2015-04-13 DIAGNOSIS — Z510 Encounter for antineoplastic radiation therapy: Secondary | ICD-10-CM | POA: Diagnosis not present

## 2015-04-13 MED ORDER — ALRA NON-METALLIC DEODORANT (RAD-ONC)
1.0000 "application " | Freq: Once | TOPICAL | Status: AC
  Administered 2015-04-13: 1 via TOPICAL

## 2015-04-13 MED ORDER — RADIAPLEXRX EX GEL
Freq: Once | CUTANEOUS | Status: AC
Start: 2015-04-13 — End: 2015-04-13
  Administered 2015-04-13: 12:00:00 via TOPICAL

## 2015-04-13 MED ORDER — ALRA NON-METALLIC DEODORANT (RAD-ONC): 1.0000 "application " | Freq: Once | TOPICAL | Status: DC

## 2015-04-13 MED ORDER — RADIAPLEXRX EX GEL: Freq: Once | CUTANEOUS | Status: DC

## 2015-04-13 NOTE — Progress Notes (Signed)
   Weekly Management Note:  Outpatient    ICD-9-CM ICD-10-CM   1. Primary cancer of lower-inner quadrant of left female breast ([facility]) 174.3 C50.312 hyaluronate sodium (RADIAPLEXRX) gel     non-metallic deodorant (ALRA) 1 application    Current Dose:  7.2 Gy  Projected Dose: 50.4 Gy   Narrative:  The patient presents for routine under treatment assessment.  CBCT/MVCT images/Port film x-rays were reviewed.  The chart was checked. Doing well. Has some shooting pains in breast.  Physical Findings:  height is 5\' 7"  (1.702 m) and weight is 290 lb 4.8 oz (131.679 kg). Her temperature is 97.7 F (36.5 C). Her blood pressure is 126/77 and her pulse is 79.   Wt Readings from Last 3 Encounters:  04/13/15 290 lb 4.8 oz (131.679 kg)  02/11/15 281 lb 8 oz (127.688 kg)  02/11/15 276 lb 6.4 oz (125.374 kg)   No skin irritation thus far over left breast  Impression:  The patient is tolerating radiotherapy.  Plan:  Continue radiotherapy as planned. Shooting pains are normal given her surgery and current treatment. Patient reassured.  ________________________________   Eppie Gibson, M.D.

## 2015-04-13 NOTE — Progress Notes (Signed)
Ms. Meddaugh is here for her 4th fraction of radiation to her Left Breast. She complains of  sharp pains in her Left Breast which she rates a 5/10. The pain are also intermittent at times, she is taking hydrocodone 10/325 once a day minimum. She at times will take it twice daily when severe. She admits to some fatigue, and sleeps at home during the day. She reports the skin over her breast is normal in appearance. She has been given cream and education was provided today.   BP 126/77 mmHg  Pulse 79  Temp(Src) 97.7 F (36.5 C)  Ht 5\' 7"  (1.702 m)  Wt 290 lb 4.8 oz (131.679 kg)  BMI 45.46 kg/m2   Wt Readings from Last 3 Encounters:  04/13/15 290 lb 4.8 oz (131.679 kg)  02/11/15 281 lb 8 oz (127.688 kg)  02/11/15 276 lb 6.4 oz (125.374 kg)

## 2015-04-13 NOTE — Progress Notes (Signed)

## 2015-04-14 ENCOUNTER — Ambulatory Visit
Admission: RE | Admit: 2015-04-14 | Discharge: 2015-04-14 | Disposition: A | Discharge status: Home / Self Care | Payer: Medicare Other | Source: Ambulatory Visit | Attending: Radiation Oncology | Admitting: Radiation Oncology

## 2015-04-14 DIAGNOSIS — Z510 Encounter for antineoplastic radiation therapy: Secondary | ICD-10-CM | POA: Diagnosis not present

## 2015-04-15 ENCOUNTER — Ambulatory Visit
Admission: RE | Admit: 2015-04-15 | Discharge: 2015-04-15 | Disposition: A | Discharge status: Home / Self Care | Payer: Medicare Other | Source: Ambulatory Visit | Attending: Radiation Oncology | Admitting: Radiation Oncology

## 2015-04-15 DIAGNOSIS — Z510 Encounter for antineoplastic radiation therapy: Secondary | ICD-10-CM | POA: Diagnosis not present

## 2015-04-16 ENCOUNTER — Encounter: Payer: Self-pay | Admitting: *Deleted

## 2015-04-16 ENCOUNTER — Ambulatory Visit
Admission: RE | Admit: 2015-04-16 | Discharge: 2015-04-16 | Disposition: A | Discharge status: Home / Self Care | Payer: Medicare Other | Source: Ambulatory Visit | Attending: Radiation Oncology | Admitting: Radiation Oncology

## 2015-04-16 DIAGNOSIS — Z510 Encounter for antineoplastic radiation therapy: Secondary | ICD-10-CM | POA: Diagnosis not present

## 2015-04-16 NOTE — Progress Notes (Signed)
Left vm to return call to assess needs during xrt. Contact information provided.

## 2015-04-17 ENCOUNTER — Ambulatory Visit
Admission: RE | Admit: 2015-04-17 | Discharge: 2015-04-17 | Disposition: A | Discharge status: Home / Self Care | Payer: Medicare Other | Source: Ambulatory Visit | Attending: Radiation Oncology | Admitting: Radiation Oncology

## 2015-04-17 DIAGNOSIS — Z510 Encounter for antineoplastic radiation therapy: Secondary | ICD-10-CM | POA: Diagnosis not present

## 2015-04-20 ENCOUNTER — Ambulatory Visit: Admission: RE | Admit: 2015-04-20 | Payer: Medicare Other | Source: Ambulatory Visit | Admitting: Radiation Oncology

## 2015-04-20 ENCOUNTER — Ambulatory Visit: Payer: Medicare Other

## 2015-04-20 ENCOUNTER — Ambulatory Visit
Admission: RE | Admit: 2015-04-20 | Discharge: 2015-04-20 | Disposition: A | Discharge status: Home / Self Care | Payer: Medicare Other | Source: Ambulatory Visit | Attending: Radiation Oncology | Admitting: Radiation Oncology

## 2015-04-21 ENCOUNTER — Ambulatory Visit
Admission: RE | Admit: 2015-04-21 | Discharge: 2015-04-21 | Disposition: A | Discharge status: Home / Self Care | Payer: Medicare Other | Source: Ambulatory Visit | Attending: Radiation Oncology | Admitting: Radiation Oncology

## 2015-04-21 DIAGNOSIS — Z510 Encounter for antineoplastic radiation therapy: Secondary | ICD-10-CM | POA: Diagnosis not present

## 2015-04-22 ENCOUNTER — Ambulatory Visit
Admission: RE | Admit: 2015-04-22 | Discharge: 2015-04-22 | Disposition: A | Discharge status: Home / Self Care | Payer: Medicare Other | Source: Ambulatory Visit | Attending: Radiation Oncology | Admitting: Radiation Oncology

## 2015-04-22 ENCOUNTER — Encounter: Payer: Self-pay | Admitting: Radiation Oncology

## 2015-04-22 VITALS — BP 120/78 | HR 80 | Temp 97.9°F | Ht 67.0 in | Wt 291.1 lb

## 2015-04-22 DIAGNOSIS — C50312 Malignant neoplasm of lower-inner quadrant of left female breast: Secondary | ICD-10-CM

## 2015-04-22 DIAGNOSIS — Z510 Encounter for antineoplastic radiation therapy: Secondary | ICD-10-CM | POA: Diagnosis not present

## 2015-04-22 NOTE — Progress Notes (Signed)
Ms. Gruden is here for her 10th fraction of radiation to her Left Breast. She denies any skin changes and is using her radiaplex cream as directed. She reports some fatigue, and takes naps during the day. She is able to do her normal daily activities.  BP 120/78 mmHg  Pulse 80  Temp(Src) 97.9 F (36.6 C)  Ht 5\' 7"  (1.702 m)  Wt 291 lb 1.6 oz (132.042 kg)  BMI 45.58 kg/m2

## 2015-04-22 NOTE — Progress Notes (Signed)
   Weekly Management Note:  Outpatient    ICD-9-CM ICD-10-CM   1. Primary cancer of lower-inner quadrant of left female breast ([facility]) 174.3 C50.312     Current Dose:  18 Gy  Projected Dose: 50.4 Gy   Narrative:  The patient presents for routine under treatment assessment.  CBCT/MVCT images/Port film x-rays were reviewed.  The chart was checked. Doing well. Some fatigue.  Physical Findings:  height is 5\' 7"  (1.702 m) and weight is 291 lb 1.6 oz (132.042 kg). Her temperature is 97.9 F (36.6 C). Her blood pressure is 120/78 and her pulse is 80.   Wt Readings from Last 3 Encounters:  04/22/15 291 lb 1.6 oz (132.042 kg)  04/13/15 290 lb 4.8 oz (131.679 kg)  02/11/15 281 lb 8 oz (127.688 kg)   Mild hyperpigmentation over left breast  Impression:  The patient is tolerating radiotherapy.  Plan:  Continue radiotherapy as planned.  ________________________________   Eppie Gibson, M.D.

## 2015-04-23 ENCOUNTER — Ambulatory Visit
Admission: RE | Admit: 2015-04-23 | Discharge: 2015-04-23 | Disposition: A | Discharge status: Home / Self Care | Payer: Medicare Other | Source: Ambulatory Visit | Attending: Radiation Oncology | Admitting: Radiation Oncology

## 2015-04-23 DIAGNOSIS — Z510 Encounter for antineoplastic radiation therapy: Secondary | ICD-10-CM | POA: Diagnosis not present

## 2015-04-24 ENCOUNTER — Ambulatory Visit
Admission: RE | Admit: 2015-04-24 | Discharge: 2015-04-24 | Disposition: A | Discharge status: Home / Self Care | Payer: Medicare Other | Source: Ambulatory Visit | Attending: Radiation Oncology | Admitting: Radiation Oncology

## 2015-04-24 DIAGNOSIS — Z510 Encounter for antineoplastic radiation therapy: Secondary | ICD-10-CM | POA: Diagnosis not present

## 2015-04-27 ENCOUNTER — Ambulatory Visit
Admission: RE | Admit: 2015-04-27 | Discharge: 2015-04-27 | Disposition: A | Discharge status: Home / Self Care | Payer: Medicare Other | Source: Ambulatory Visit | Attending: Radiation Oncology | Admitting: Radiation Oncology

## 2015-04-27 DIAGNOSIS — Z510 Encounter for antineoplastic radiation therapy: Secondary | ICD-10-CM | POA: Diagnosis not present

## 2015-04-28 ENCOUNTER — Encounter: Payer: Self-pay | Admitting: Radiation Oncology

## 2015-04-28 ENCOUNTER — Ambulatory Visit
Admission: RE | Admit: 2015-04-28 | Discharge: 2015-04-28 | Disposition: A | Discharge status: Home / Self Care | Payer: Medicare Other | Source: Ambulatory Visit | Attending: Radiation Oncology | Admitting: Radiation Oncology

## 2015-04-28 VITALS — BP 115/66 | HR 79 | Temp 98.2°F | Wt 291.6 lb

## 2015-04-28 DIAGNOSIS — Z923 Personal history of irradiation: Secondary | ICD-10-CM | POA: Insufficient documentation

## 2015-04-28 DIAGNOSIS — Z510 Encounter for antineoplastic radiation therapy: Secondary | ICD-10-CM | POA: Diagnosis not present

## 2015-04-28 DIAGNOSIS — C50312 Malignant neoplasm of lower-inner quadrant of left female breast: Secondary | ICD-10-CM

## 2015-04-28 MED ORDER — RADIAPLEXRX EX GEL
Freq: Once | CUTANEOUS | Status: AC
Start: 2015-04-28 — End: 2015-04-28
  Administered 2015-04-28: 10:00:00 via TOPICAL

## 2015-04-28 NOTE — Progress Notes (Signed)
Ms. Veith is here for her 14th fraction of radiation to her Left Breast. She admits to some fatigue, and rests during the day often. She is receiving help from her daughter at home. She has some hyperpigmentation to her upper Left Breast. She gets some redness around her nipple area, which is also tender. She is using radiaplex cream three times daily. She reports itching to her breast, and I have suggested hydrocortizone ointment to help with this.   BP 115/66 mmHg  Pulse 79  Temp(Src) 98.2 F (36.8 C)  Wt 291 lb 9.6 oz (132.269 kg)   Wt Readings from Last 3 Encounters:  04/28/15 291 lb 9.6 oz (132.269 kg)  04/22/15 291 lb 1.6 oz (132.042 kg)  04/13/15 290 lb 4.8 oz (131.679 kg)

## 2015-04-28 NOTE — Progress Notes (Signed)
  Radiation Oncology         (336) [phone] ________________________________  Name: Caitlyn Bautista MRN: [id number]  Date: 04/28/2015  DOB: 04/14/1950  Weekly Radiation Therapy Management    ICD-9-CM ICD-10-CM   1. Primary cancer of lower-inner quadrant of left female breast ([facility]) 174.3 C50.312 hyaluronate sodium (RADIAPLEXRX) gel     Current Dose: 25.2 Gy     Planned Dose:  50.4 Gy  Narrative . . . . . . . . The patient presents for routine under treatment assessment. Caitlyn Bautista is here for her 14th fraction of radiation to her Left Breast. She admits to some fatigue and rests during the day often. She is receiving help from her daughter at home. She reports left breast tenderness. She is using radiaplex cream three times daily. She reports itching to her breast.                                 Set-up films were reviewed.                                 The chart was checked. Physical Findings. . .  weight is 291 lb 9.6 oz (132.269 kg). Her temperature is 98.2 F (36.8 C). Her blood pressure is 115/66 and her pulse is 79.   The patient presents to the clinic in a wheelchair. Mild hyperpigmentation changes to the left breast, no skin breakdown. The lungs are clear. The heart has a regular rhythm and rate Impression . . . . . . . The patient is tolerating radiation. Plan . . . . . . . . . . . . Continue treatment as planned. ________________________________   Blair Promise, PhD, MD  This document serves as a record of services personally performed by Gery Pray, MD. It was created on his behalf by Darcus Austin, a trained medical scribe. The creation of this record is based on the scribe's personal observations and the provider's statements to them. This document has been checked and approved by the attending provider.

## 2015-04-29 ENCOUNTER — Ambulatory Visit
Admission: RE | Admit: 2015-04-29 | Discharge: 2015-04-29 | Disposition: A | Discharge status: Home / Self Care | Payer: Medicare Other | Source: Ambulatory Visit | Attending: Radiation Oncology | Admitting: Radiation Oncology

## 2015-04-29 DIAGNOSIS — Z510 Encounter for antineoplastic radiation therapy: Secondary | ICD-10-CM | POA: Diagnosis not present

## 2015-04-30 ENCOUNTER — Ambulatory Visit
Admission: RE | Admit: 2015-04-30 | Discharge: 2015-04-30 | Disposition: A | Discharge status: Home / Self Care | Payer: Medicare Other | Source: Ambulatory Visit | Attending: Radiation Oncology | Admitting: Radiation Oncology

## 2015-04-30 DIAGNOSIS — Z510 Encounter for antineoplastic radiation therapy: Secondary | ICD-10-CM | POA: Diagnosis not present

## 2015-05-01 ENCOUNTER — Ambulatory Visit
Admission: RE | Admit: 2015-05-01 | Discharge: 2015-05-01 | Disposition: A | Discharge status: Home / Self Care | Payer: Medicare Other | Source: Ambulatory Visit | Attending: Radiation Oncology | Admitting: Radiation Oncology

## 2015-05-01 DIAGNOSIS — Z510 Encounter for antineoplastic radiation therapy: Secondary | ICD-10-CM | POA: Diagnosis not present

## 2015-05-04 ENCOUNTER — Ambulatory Visit
Admission: RE | Admit: 2015-05-04 | Discharge: 2015-05-04 | Disposition: A | Discharge status: Home / Self Care | Payer: Medicare Other | Source: Ambulatory Visit | Attending: Radiation Oncology | Admitting: Radiation Oncology

## 2015-05-04 VITALS — BP 112/76 | HR 77 | Temp 98.2°F | Ht 67.0 in | Wt 287.7 lb

## 2015-05-04 DIAGNOSIS — C50312 Malignant neoplasm of lower-inner quadrant of left female breast: Secondary | ICD-10-CM

## 2015-05-04 DIAGNOSIS — Z510 Encounter for antineoplastic radiation therapy: Secondary | ICD-10-CM | POA: Diagnosis not present

## 2015-05-04 NOTE — Progress Notes (Addendum)
Caitlyn Bautista is here for her 18th fraction of radiation to her Left Breast. She admits to fatigue. She states she falls asleep a lot during the day, and goes to bed early at night. She rates the pain in her Left Breast a 5/10. She is taking 1 Norco in the morning and at nighttime. She reports her breast is very tender, and painful to have clothing rub on it. The area is tender around her nipple, the skin is darkened. There is some redness noted underneath her breast. She is using the radiaplex cream as directed.   BP 112/76 mmHg  Pulse 77  Temp(Src) 98.2 F (36.8 C)  Ht 5\' 7"  (1.702 m)  Wt 287 lb 11.2 oz (130.5 kg)  BMI 45.05 kg/m2   Wt Readings from Last 3 Encounters:  05/04/15 287 lb 11.2 oz (130.5 kg)  04/28/15 291 lb 9.6 oz (132.269 kg)  04/22/15 291 lb 1.6 oz (132.042 kg)

## 2015-05-04 NOTE — Progress Notes (Signed)
   Weekly Management Note:  Outpatient    ICD-9-CM ICD-10-CM   1. Primary cancer of lower-inner quadrant of left female breast ([facility]) 174.3 C50.312     Current Dose:  32.4 Gy  Projected Dose: 50.4 Gy   Narrative:  The patient presents for routine under treatment assessment.  CBCT/MVCT images/Port film x-rays were reviewed.  The chart was checked. Admits to feeling more depressed today; denies thoughts of self harm or harming others. She reports she is taking her medications and has an appt with her therapist next week. She is followed by a psychiatrist as well for bipolar d/o  Pain addressed with Norco PRN. Fatigued.  Physical Findings:  height is 5\' 7"  (1.702 m) and weight is 287 lb 11.2 oz (130.5 kg). Her temperature is 98.2 F (36.8 C). Her blood pressure is 112/76 and her pulse is 77.   Wt Readings from Last 3 Encounters:  05/04/15 287 lb 11.2 oz (130.5 kg)  04/28/15 291 lb 9.6 oz (132.269 kg)  04/22/15 291 lb 1.6 oz (132.042 kg)   Mild hyperpigmentation over left breast - skin intact.   Impression:  The patient is tolerating radiotherapy.  Plan:  Continue radiotherapy as planned.  Offered to call social work or Clinical biochemist today. She declines.  Provided empathetic listening and words of support. Suggested she call her therapist to move up appointment.  I will ask social work and chaplain to reach out to patient.  ________________________________   Eppie Gibson, M.D.

## 2015-05-05 ENCOUNTER — Ambulatory Visit
Admission: RE | Admit: 2015-05-05 | Discharge: 2015-05-05 | Disposition: A | Discharge status: Home / Self Care | Payer: Medicare Other | Source: Ambulatory Visit | Attending: Radiation Oncology | Admitting: Radiation Oncology

## 2015-05-05 DIAGNOSIS — Z510 Encounter for antineoplastic radiation therapy: Secondary | ICD-10-CM | POA: Diagnosis not present

## 2015-05-06 ENCOUNTER — Ambulatory Visit
Admission: RE | Admit: 2015-05-06 | Discharge: 2015-05-06 | Disposition: A | Discharge status: Home / Self Care | Payer: Medicare Other | Source: Ambulatory Visit | Attending: Radiation Oncology | Admitting: Radiation Oncology

## 2015-05-06 DIAGNOSIS — Z510 Encounter for antineoplastic radiation therapy: Secondary | ICD-10-CM | POA: Diagnosis not present

## 2015-05-07 ENCOUNTER — Encounter: Payer: Self-pay | Admitting: *Deleted

## 2015-05-07 ENCOUNTER — Ambulatory Visit
Admission: RE | Admit: 2015-05-07 | Discharge: 2015-05-07 | Disposition: A | Discharge status: Home / Self Care | Payer: Medicare Other | Source: Ambulatory Visit | Attending: Radiation Oncology | Admitting: Radiation Oncology

## 2015-05-07 DIAGNOSIS — Z510 Encounter for antineoplastic radiation therapy: Secondary | ICD-10-CM | POA: Diagnosis not present

## 2015-05-07 NOTE — Progress Notes (Signed)
[place] Work  Clinical Social Work was referred by Pension scheme manager for assessment of psychosocial needs.  Clinical Social Worker has made several attempts this week to catch pt at treatment and keeps missing her. CSW attempted to contact patient via phone to offer support and assess for needs.  CSW left supportive message explaining role of CSW/Pt and Family Support Team, resources available and encouraged pt to return call. CSW will continue to try to catch pt at treatment and awaits return call.   Loren Racer, [place] Worker [place]  [place] Phone: [phone] Fax: [phone]

## 2015-05-08 ENCOUNTER — Ambulatory Visit
Admission: RE | Admit: 2015-05-08 | Discharge: 2015-05-08 | Disposition: A | Discharge status: Home / Self Care | Payer: Medicare Other | Source: Ambulatory Visit | Attending: Radiation Oncology | Admitting: Radiation Oncology

## 2015-05-08 DIAGNOSIS — Z510 Encounter for antineoplastic radiation therapy: Secondary | ICD-10-CM | POA: Diagnosis not present

## 2015-05-11 ENCOUNTER — Encounter: Payer: Self-pay | Admitting: Radiation Oncology

## 2015-05-11 ENCOUNTER — Ambulatory Visit
Admission: RE | Admit: 2015-05-11 | Discharge: 2015-05-11 | Disposition: A | Discharge status: Home / Self Care | Payer: Medicare Other | Source: Ambulatory Visit | Attending: Radiation Oncology | Admitting: Radiation Oncology

## 2015-05-11 ENCOUNTER — Encounter: Payer: Self-pay | Admitting: *Deleted

## 2015-05-11 VITALS — BP 120/85 | HR 82 | Temp 98.3°F | Ht 67.0 in | Wt 293.5 lb

## 2015-05-11 DIAGNOSIS — Z923 Personal history of irradiation: Secondary | ICD-10-CM | POA: Insufficient documentation

## 2015-05-11 DIAGNOSIS — C50312 Malignant neoplasm of lower-inner quadrant of left female breast: Secondary | ICD-10-CM | POA: Insufficient documentation

## 2015-05-11 DIAGNOSIS — Z510 Encounter for antineoplastic radiation therapy: Secondary | ICD-10-CM | POA: Diagnosis not present

## 2015-05-11 MED ORDER — SONAFINE EX EMUL
1.0000 "application " | Freq: Once | CUTANEOUS | Status: AC
  Administered 2015-05-11: 1 via TOPICAL
  Filled 2015-05-11: qty 45

## 2015-05-11 NOTE — Progress Notes (Signed)
[place] Work  Holiday representative met with patient at [facility] after treatment to offer support and assess for needs.  CSW had left pt vm last week after missing her after her radiation treatments. Pt open to meeting with CSW and shared her recent concerns about her emotional state. Pt shared common emotions related to cancer treatment and CSW validated these emotions. Pt reports she has a regular therapist and sees her this Thursday. She is open to additional support at [facility] and plans to meet with CSW in her office for a counseling session on 05/13/15 after treatment that day. Pt reports she is in better spirits overall than last week. She is open to [facility] and additional supports. CSW will see Wed.   Clinical Social Work interventions: Supportive listening  Loren Racer, [place] Worker [place]  [place] Phone: [phone] Fax: [phone]

## 2015-05-11 NOTE — Progress Notes (Signed)
   Weekly Management Note:  Outpatient    ICD-9-CM ICD-10-CM   1. Primary cancer of lower-inner quadrant of left female breast ([facility]) 174.3 C50.312     Current Dose:  41. 4 Gy  Projected Dose: 50.4 Gy   Narrative:  The patient presents for routine under treatment assessment.  CBCT/MVCT images/Port film x-rays were reviewed.  The chart was checked. Feeling better today.  She saw social work today. Some burning sensation over left breast  Physical Findings:  height is 5\' 7"  (1.702 m) and weight is 293 lb 8 oz (133.131 kg). Her temperature is 98.3 F (36.8 C). Her blood pressure is 120/85 and her pulse is 82.   Wt Readings from Last 3 Encounters:  05/11/15 293 lb 8 oz (133.131 kg)  05/04/15 287 lb 11.2 oz (130.5 kg)  04/28/15 291 lb 9.6 oz (132.269 kg)   moderate hyperpigmentation over left breast is relatively stable to slightly increased- skin intact.  Impression:  The patient is tolerating radiotherapy.  Plan:  Continue radiotherapy as planned.  sonafine cream for skin ________________________________   Eppie Gibson, M.D.

## 2015-05-11 NOTE — Addendum Note (Signed)
Encounter addended by: Wynona Neat, [facility] on: 05/11/2015 11:05 AM<BR>     Documentation filed: Rx Order Verification

## 2015-05-11 NOTE — Addendum Note (Signed)
Encounter addended by: Ernst Spell, RN on: 05/11/2015 11:25 AM<BR>     Documentation filed: Inpatient MAR

## 2015-05-11 NOTE — Progress Notes (Signed)
Ms. Gurule is here for her 23rd fraction of radiation to her Left Breast. She admits to fatigue and feeling down over the last week. She has spoken with the social worker today, and plans to meet with Abby Potash on Wednesday after her treatment. She reports tenderness underneath her breast, to her axilla and areola area. Her skin is red, hyperpigmented to the entire breast and axilla. She is using the radiaplex cream as directed.  BP 120/85 mmHg  Pulse 82  Temp(Src) 98.3 F (36.8 C)  Ht 5\' 7"  (1.702 m)  Wt 293 lb 8 oz (133.131 kg)  BMI 45.96 kg/m2   Wt Readings from Last 3 Encounters:  05/11/15 293 lb 8 oz (133.131 kg)  05/04/15 287 lb 11.2 oz (130.5 kg)  04/28/15 291 lb 9.6 oz (132.269 kg)

## 2015-05-12 ENCOUNTER — Ambulatory Visit
Admission: RE | Admit: 2015-05-12 | Discharge: 2015-05-12 | Disposition: A | Discharge status: Home / Self Care | Payer: Medicare Other | Source: Ambulatory Visit | Attending: Radiation Oncology | Admitting: Radiation Oncology

## 2015-05-12 DIAGNOSIS — Z510 Encounter for antineoplastic radiation therapy: Secondary | ICD-10-CM | POA: Diagnosis not present

## 2015-05-12 DIAGNOSIS — C50312 Malignant neoplasm of lower-inner quadrant of left female breast: Secondary | ICD-10-CM | POA: Insufficient documentation

## 2015-05-13 ENCOUNTER — Ambulatory Visit
Admission: RE | Admit: 2015-05-13 | Discharge: 2015-05-13 | Disposition: A | Discharge status: Home / Self Care | Payer: Medicare Other | Source: Ambulatory Visit | Attending: Radiation Oncology | Admitting: Radiation Oncology

## 2015-05-13 ENCOUNTER — Encounter: Payer: Self-pay | Admitting: *Deleted

## 2015-05-13 DIAGNOSIS — Z510 Encounter for antineoplastic radiation therapy: Secondary | ICD-10-CM | POA: Diagnosis not present

## 2015-05-13 NOTE — Progress Notes (Signed)
[place] Work  Clinical Social Work met with pt at [employer] today in [place] office. Pt shared many concerns and anxieties about finances, insurance, support needs and her mental health. Pt has good coping techniques and uses coloring, visualization, reading her bible and watching tv as her coping techniques. These have been working well for her when she "remembers to slow down and use them". Pt appears to have good support from family, church and friends. She has some concerns about her eldest daughter distancing her self from her mother during her treatment. CSW educated pt that some children, even adult children have difficulty with mom being "mortal". CSW referred her to financial counselor in order to get approved for the [employer]. Pt gets section 8 housing asst and per her income, she should qualify. Pt open to breast cancer support group and Loss adjuster, chartered. CSW to complete referral today. CSW to explore other support resources as well. Pt is also interested in [facility]. CSW to follow and meet with pt on Thursday after treatment. Pt provided with a SCAT pass today.   Clinical Social Work interventions: Supportive Psychiatric nurse education and referral   Loren Racer, [place] Worker [place]  [place] Phone: [phone] Fax: [phone]

## 2015-05-14 ENCOUNTER — Ambulatory Visit
Admission: RE | Admit: 2015-05-14 | Discharge: 2015-05-14 | Disposition: A | Discharge status: Home / Self Care | Payer: Medicare Other | Source: Ambulatory Visit | Attending: Radiation Oncology | Admitting: Radiation Oncology

## 2015-05-14 DIAGNOSIS — Z510 Encounter for antineoplastic radiation therapy: Secondary | ICD-10-CM | POA: Diagnosis not present

## 2015-05-15 ENCOUNTER — Ambulatory Visit: Payer: Medicare Other

## 2015-05-15 ENCOUNTER — Ambulatory Visit
Admission: RE | Admit: 2015-05-15 | Discharge: 2015-05-15 | Disposition: A | Discharge status: Home / Self Care | Payer: Medicare Other | Source: Ambulatory Visit | Attending: Radiation Oncology | Admitting: Radiation Oncology

## 2015-05-15 DIAGNOSIS — Z510 Encounter for antineoplastic radiation therapy: Secondary | ICD-10-CM | POA: Diagnosis not present

## 2015-05-18 ENCOUNTER — Encounter: Payer: Self-pay | Admitting: *Deleted

## 2015-05-18 ENCOUNTER — Telehealth: Payer: Self-pay | Admitting: *Deleted

## 2015-05-18 ENCOUNTER — Ambulatory Visit
Admission: RE | Admit: 2015-05-18 | Discharge: 2015-05-18 | Disposition: A | Discharge status: Home / Self Care | Payer: Medicare Other | Source: Ambulatory Visit | Attending: Radiation Oncology | Admitting: Radiation Oncology

## 2015-05-18 ENCOUNTER — Encounter: Payer: Self-pay | Admitting: Radiation Oncology

## 2015-05-18 ENCOUNTER — Ambulatory Visit: Payer: Medicare Other

## 2015-05-18 VITALS — BP 126/58 | HR 75 | Temp 98.2°F | Resp 16 | Wt 294.0 lb

## 2015-05-18 DIAGNOSIS — Z923 Personal history of irradiation: Secondary | ICD-10-CM | POA: Insufficient documentation

## 2015-05-18 DIAGNOSIS — Z510 Encounter for antineoplastic radiation therapy: Secondary | ICD-10-CM | POA: Diagnosis not present

## 2015-05-18 DIAGNOSIS — C50312 Malignant neoplasm of lower-inner quadrant of left female breast: Secondary | ICD-10-CM

## 2015-05-18 MED ORDER — RADIAPLEXRX EX GEL
Freq: Once | CUTANEOUS | Status: AC
  Administered 2015-05-18: 11:00:00 via TOPICAL

## 2015-05-18 NOTE — Telephone Encounter (Signed)
Left message for a return phone call to follow up post XRT. 

## 2015-05-18 NOTE — Progress Notes (Signed)
[place] Work  Clinical Social Work continues to follow for assistance. Pt was to meet with CSW today after treatment to review assistance options and complete applications. Pt never showed to meet with CSW. CSW phoned pt and left message for pt to return call. CSW did submit request through [employer] for pt. CSW to await return call and can follow up on 05/20/15.   Clinical Social Work interventions: Resource assistance  Loren Racer, [place] Worker [place]  [place] Phone: [phone] Fax: [phone]

## 2015-05-18 NOTE — Progress Notes (Signed)
Week;y rad txs left breast, hyperpigmentation, skin intact, no c/o pain, appetite and enrgy level fair, 1 month f/u appt card given, also another tube of radiaplex gel per patient request 10:25 AM BP 126/58 mmHg  Pulse 75  Temp(Src) 98.2 F (36.8 C) (Oral)  Resp 16  Wt 294 lb (133.358 kg)  Wt Readings from Last 3 Encounters:  05/18/15 294 lb (133.358 kg)  05/11/15 293 lb 8 oz (133.131 kg)  05/04/15 287 lb 11.2 oz (130.5 kg)

## 2015-05-18 NOTE — Progress Notes (Signed)
   Weekly Management Note:  Outpatient    ICD-9-CM ICD-10-CM   1. Primary cancer of lower-inner quadrant of left female breast (HCC) 174.3 C50.312 hyaluronate sodium (RADIAPLEXRX) gel    Current Dose:  50.4 Gy  Projected Dose: 50.4 Gy   Narrative:  The patient presents for routine under treatment assessment.  CBCT/MVCT images/Port film x-rays were reviewed.  The chart was checked. Skin darker over left breast; otherwise no new issues  Physical Findings:  weight is 294 lb (133.358 kg). Her oral temperature is 98.2 F (36.8 C). Her blood pressure is 126/58 and her pulse is 75. Her respiration is 16.   Wt Readings from Last 3 Encounters:  05/18/15 294 lb (133.358 kg)  05/11/15 293 lb 8 oz (133.131 kg)  05/04/15 287 lb 11.2 oz (130.5 kg)     hyperpigmentation over left breast is slightly increased- skin intact.  Impression:  The patient has tolerated radiotherapy.  Plan: skin care discussed F/u in 1mo ________________________________   Eppie Gibson, M.D.

## 2015-05-20 ENCOUNTER — Other Ambulatory Visit: Payer: Self-pay | Admitting: Adult Health

## 2015-05-20 DIAGNOSIS — C50312 Malignant neoplasm of lower-inner quadrant of left female breast: Secondary | ICD-10-CM

## 2015-05-29 NOTE — Progress Notes (Signed)
  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 She experienced some hyperpigmentation of the skin within the treatment field.   Plan: The patient has completed radiation treatment. The patient will return to radiation oncology clinic for routine followup in one month. I advised them to call or return sooner if they have any questions or concerns related to their recovery or treatment.  -----------------------------------  Eppie Gibson, MD   This document serves as a record of services personally performed by Eppie Gibson, MD. It was created on her behalf by Arlyce Harman, a trained medical scribe. The creation of this record is based on the scribe's personal observations and the provider's statements to them. This document has been checked and approved by the attending provider.

## 2015-06-01 ENCOUNTER — Ambulatory Visit: Payer: Self-pay | Admitting: Hematology and Oncology

## 2015-06-12 ENCOUNTER — Encounter: Payer: Self-pay | Admitting: *Deleted

## 2015-06-12 NOTE — Progress Notes (Signed)
Caitlyn Bautista  Clinical Social Bautista finally received return call from the several that [place] left a few weeks ago. Pt stated she was upset that she was not approved for financial assistance. CSW reminded pt that there was still needed paperwork needed to complete applications. CSW completed application for assistance to [employer] as well, weeks ago. CSW left additional supportive message and encouraged pt to return CSW call.  Clinical Social Bautista interventions: Resource education  Caitlyn Bautista, [place] Worker [place]  [place] Phone: [phone] Fax: [phone]

## 2015-06-19 ENCOUNTER — Encounter: Payer: Self-pay | Admitting: Radiation Oncology

## 2015-06-19 ENCOUNTER — Ambulatory Visit
Admission: RE | Admit: 2015-06-19 | Discharge: 2015-06-19 | Disposition: A | Discharge status: Home / Self Care | Payer: Medicare Other | Source: Ambulatory Visit | Attending: Radiation Oncology | Admitting: Radiation Oncology

## 2015-06-19 VITALS — BP 135/78 | HR 79 | Temp 98.5°F | Ht 67.0 in | Wt 294.6 lb

## 2015-06-19 DIAGNOSIS — C50312 Malignant neoplasm of lower-inner quadrant of left female breast: Secondary | ICD-10-CM

## 2015-06-19 NOTE — Progress Notes (Signed)
Caitlyn Bautista presents for follow up of radiation completed 05/18/15 to her Left Breast. She reports generalized pain of 10/10, she relates this to fibromyalgia and arthritis. She reports depression. She has been frustrated with social work and declines to see them at this time. I have offered her some food from our food pantry to assist with the difficulty she has financially obtaining food. She reports seeing Dr. Marlou Starks Wednesday March 29th for a follow up and mentioned pain in her breast. She states this pain is burning and stabbing which are constant. The stabbing is over her central breast, and the burning is closer to her axilla. She states this pain began about two week after radiation completed. She reports her areola will often swell and she can see veins at times appear.  BP 135/78 mmHg  Pulse 79  Temp(Src) 98.5 F (36.9 C)  Ht 5\' 7"  (1.702 m)  Wt 294 lb 9.6 oz (133.63 kg)  BMI 46.13 kg/m2   Wt Readings from Last 3 Encounters:  06/19/15 294 lb 9.6 oz (133.63 kg)  05/18/15 294 lb (133.358 kg)  05/11/15 293 lb 8 oz (133.131 kg)

## 2015-06-19 NOTE — Progress Notes (Signed)
Radiation Oncology         (336) [phone] ________________________________  Name: Caitlyn Bautista MRN: [id number]  Date: 06/19/2015  DOB: [birth year]  Follow-Up Visit Note  Outpatient  CC: Hoyt Koch, MD  Caitlyn Kussmaul, MD  Diagnosis and Prior Radiotherapy:    ICD-9-CM ICD-10-CM   1. 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    [name] presents for follow up of radiation completed 05/18/15 to her Left Breast. She reports generalized pain of 10/10, she relates this to fibromyalgia and arthritis. This pain has progressed since two weeks after completion of radiation treatment, a burning,stabbing pain. She reports depression. She has been frustrated with social work and declines to see them at this time. She reports seeing Dr. Marlou Starks Wednesday March 29th for a follow up and mentioned pain in her breast. She states this pain is burning and stabbing which are constant. The stabbing is over her central breast, and the burning is closer to her axilla. She states this pain began about two week after radiation completed. She reports her areola will often swell and she can see veins at times appear.            She denies any thoughts of self-harm. She is significantly stressed out concerning the bills for her treatment.                     She has an appointment with Dr. Lindi Adie on 06/30/15  ALLERGIES:  is allergic to ace inhibitors; other; decongestant; and nsaids.  Meds: Current Outpatient Prescriptions  Medication Sig Dispense Refill  . albuterol (PROVENTIL HFA;VENTOLIN HFA) 108 (90 BASE) MCG/ACT inhaler Inhale 2 puffs into the lungs daily. For wheezing. 1 Inhaler 0  . amLODipine (NORVASC)  10 MG tablet TAKE ONE TABLET BY MOUTH ONCE DAILY FOR  HYPERTENSION 90 tablet 1  . buPROPion (WELLBUTRIN XL) 150 MG 24 hr tablet Take 1 tablet (150 mg total) by mouth daily. 30 tablet 0  . diazepam (VALIUM) 5 MG tablet Take 1 tablet (5 mg total) by mouth 3 (three) times daily as needed for anxiety. 30 tablet 0  . DULoxetine (CYMBALTA) 60 MG capsule Take 60 mg by mouth 2 (two) times daily.     . fexofenadine (ALLEGRA) 180 MG tablet Take 1 tablet (180 mg total) by mouth daily. For seasonal allergies.    . fluticasone (FLONASE) 50 MCG/ACT nasal spray Place 2 sprays into both nostrils daily. For seasonal allergies. (Patient taking differently: Place 2 sprays into both nostrils daily as needed for allergies. For seasonal allergies.) 16 g 0  . gabapentin (NEURONTIN) 300 MG capsule Take 300 mg by mouth 3 (three) times daily.    Marland Kitchen HYDROcodone-acetaminophen (NORCO) 10-325 MG per tablet Take 1 tablet by mouth every 8 (eight) hours as needed. 90 tablet 0  . lithium carbonate 300 MG capsule Take two capsules (600mg ) by mouth with breakfast. Take 3 capsules (900mg ) with dinner for mood stabilization. (Patient taking differently: Take 300 mg by mouth 2 (two) times daily with a meal. ) 150 capsule 0  . mineral oil liquid Take 15 mLs by mouth  daily as needed for moderate constipation.    . ranitidine (ZANTAC) 75 MG tablet Take 1 tablet (75 mg total) by mouth 2 (two) times daily.    Marland Kitchen emollient (RADIAGEL) gel Apply 1 application topically as needed for wound care. Reported on [id number]    . non-metallic deodorant Jethro Poling) MISC Apply 1 application topically daily as needed. Reported on 06/19/2015     No current facility-administered medications for this encounter.    Physical Findings: The patient is in no acute distress. Patient is alert and oriented.  height is 5\' 7"  (1.702 m) and weight is 294 lb 9.6 oz (133.63 kg). Her temperature is 98.5 F (36.9 C). Her blood pressure is 135/78 and her pulse is 79. .    Significant healing over the left breast with faint hyperpigmentation laterally.  No significant edema /swelling or warmth.  Lab Findings: Lab Results  Component Value Date   WBC 9.5 01/21/2015   HGB 11.7* 01/21/2015   HCT 37.0 01/21/2015   MCV 87.1 01/21/2015   PLT 366 01/21/2015    Radiographic Findings: No results found.  Impression/Plan:  I encouraged her to continue with yearly mammography and followup with medical oncology. I will see her back on an as-needed basis. I have encouraged her to call if she has any issues or concerns in the future. I wished her the very best.  I gave her emotlional support regarding her multiple stressors. She declines seeing social work today.  Hydrogel pads provided for sensation of burning over left breast. I think this will improve with time. She states she cannot take NSAIDS due to other medications she is on.  _____________________________________   Eppie Gibson, MD  This document serves as a record of services personally performed by Eppie Gibson, MD. It was created on her behalf by Derek Mound, a trained medical scribe. The creation of this record is based on the scribe's personal observations and the provider's statements to them. This document has been checked and approved by the attending provider.

## 2015-06-30 ENCOUNTER — Ambulatory Visit: Payer: Self-pay | Admitting: Hematology and Oncology

## 2015-07-14 ENCOUNTER — Ambulatory Visit (HOSPITAL_BASED_OUTPATIENT_CLINIC_OR_DEPARTMENT_OTHER): Payer: Medicare Other | Admitting: Hematology and Oncology

## 2015-07-14 ENCOUNTER — Encounter: Payer: Self-pay | Admitting: Hematology and Oncology

## 2015-07-14 ENCOUNTER — Telehealth: Payer: Self-pay | Admitting: Hematology and Oncology

## 2015-07-14 VITALS — BP 160/77 | HR 86 | Temp 98.2°F | Resp 18 | Ht 67.0 in | Wt 302.3 lb

## 2015-07-14 DIAGNOSIS — R635 Abnormal weight gain: Secondary | ICD-10-CM

## 2015-07-14 DIAGNOSIS — C50312 Malignant neoplasm of lower-inner quadrant of left female breast: Secondary | ICD-10-CM | POA: Diagnosis not present

## 2015-07-14 MED ORDER — TAMOXIFEN CITRATE 20 MG PO TABS: 20.0000 mg | ORAL_TABLET | Freq: Every day | ORAL | Status: DC

## 2015-07-14 MED ORDER — "KENDALL HYDROGEL GAUZE 4""X4"" EX PADS": 1.0000 "application " | MEDICATED_PAD | Freq: Every day | CUTANEOUS | Status: DC

## 2015-07-14 MED ORDER — DIAB EX GEL: 1.0000 "application " | CUTANEOUS | Status: DC | PRN

## 2015-07-14 NOTE — Telephone Encounter (Signed)
appt made and avs printed °

## 2015-07-14 NOTE — Assessment & Plan Note (Signed)
left lumpectomy 01/28/2015: DCIS 1.8 cm, margins negative, closest margin 0.3 cm, ALH,fibrocystic changes with calcifications, Tis N0 stage 0, ER 100%, PR 100% low to intermediate grade Completed adjuvant radiation therapy 04/08/2015 to 05/18/2015 (Dr.Squire)  Recommendation: Antiestrogen therapy with tamoxifen 5 years We discussed the risks and benefits of tamoxifen. These include but not limited to insomnia, hot flashes, mood changes, vaginal dryness, and weight gain. Although rare, serious side effects including endometrial cancer, risk of blood clots were also discussed. We strongly believe that the benefits far outweigh the risks. Patient understands these risks and consented to starting treatment. Planned treatment duration is 5 years.  Return to clinic in 3 months for toxicity check and follow-up on tamoxifen

## 2015-07-14 NOTE — Progress Notes (Signed)
Patient Care Team: Hoyt Koch, MD as PCP - General (Internal Medicine) [name], MD as Consulting Physician (General Surgery) Nicholas Lose, MD as Consulting Physician (Hematology and Oncology) Arloa Koh, MD as Consulting Physician (Radiation Oncology) Mauro Kaufmann, RN as Registered Nurse Rockwell Germany, RN as Registered Nurse Sylvan Cheese, NP as Nurse Practitioner (Nurse Practitioner)  DIAGNOSIS: Primary cancer of lower-inner quadrant of left female breast [facility])   Staging form: Breast, AJCC 7th Edition     Clinical stage from 12/10/2014: Stage 0 (Tis (DCIS), N0, M0) - Unsigned     Pathologic stage from 01/30/2015: Stage Unknown (Tis (DCIS), NX, cM0) - Unsigned       Staging comments: Staged on surgical specimen by Dr. Saralyn Pilar  SUMMARY OF ONCOLOGIC HISTORY:   Primary cancer of lower-inner quadrant of left female breast ([place])   11/26/2014 Mammogram Left breast: 8:00 calcifications spanning 5 mm   12/02/2014 Initial Diagnosis Left breast biopsy: Low to intermediate grade DCIS with calcifications, ER+ 100%, PR+ 100%   12/02/2014 Clinical Stage Stage 0: Tis N0   12/11/2014 Procedure Breast/Ovarian panel revealed no deleterious mutations at ATM, BARD1, BRCA1, BRCA2, BRIP1, CDH1, CHEK2, FANCC, MLH1, MSH2, MSH6, NBN, PALB2, PMS2, PTEN, RAD51C, RAD51D, TP53, and XRCC2.   01/28/2015 Surgery Left lumpectomy: DCIS 1.8 cm, margins negative, closest margin 0.3 cm, ALH,fibrocystic changes with calcifications, ER+ 100%, PR+ 100% low to intermediate grade   01/28/2015 Pathologic Stage Stage 0: Tis Nx   04/08/2015 - 05/18/2015 Radiation Therapy Adjuvant radiation therapy (Dr.Squire): Left breast / 50.4 Gy in 28 fractions   07/14/2015 -  Anti-estrogen oral therapy Tamoxifen 20 mg daily to begin ~07/14/2015. Planned duration of therapy 5 years.    CHIEF COMPLIANT: follow-up after radiation therapy  INTERVAL HISTORY: Caitlyn Bautista is a 65 year old with above-mentioned history of left  breast cancer who completed adjuvant radiation therapy and is here today to discuss starting adjuvant tamoxifen therapy. She has healed and recovered very well from effects of radiation. 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 Her major complaint is severe profound weight gain of 50 pounds since this started.she is struggling with losing weight.  REVIEW OF SYSTEMS:   Constitutional: Denies fevers, chills or abnormal weight loss, wheelchair-bound Eyes: Denies blurriness of vision Ears, nose, mouth, throat, and face: Denies mucositis or sore throat Respiratory: Denies cough, dyspnea or wheezes Cardiovascular: Denies palpitation, chest discomfort Gastrointestinal:  Denies nausea, heartburn or change in bowel habits Skin: Denies abnormal skin rashes Lymphatics: Denies new lymphadenopathy or easy bruising Neurological:Denies numbness, tingling or new weaknesses Behavioral/Psych: Mood is stable, no new changes  Extremities: No lower extremity edema BAll other systems were reviewed with the patient and are negative.  I have reviewed the past medical history, past surgical history, social history and family history with the patient and they are unchanged from previous note.  ALLERGIES:  is allergic to ace inhibitors; other; decongestant; and nsaids.  MEDICATIONS:  Current Outpatient Prescriptions  Medication Sig Dispense Refill  . albuterol (PROVENTIL HFA;VENTOLIN HFA) 108 (90 BASE) MCG/ACT inhaler Inhale 2 puffs into the lungs daily. For wheezing. 1 Inhaler 0  . amLODipine (NORVASC) 10 MG tablet TAKE ONE TABLET BY MOUTH ONCE DAILY FOR  HYPERTENSION 90 tablet 1  . buPROPion (WELLBUTRIN XL) 150 MG 24 hr tablet Take 1 tablet (150 mg total) by mouth daily. 30 tablet 0  . diazepam (VALIUM) 5 MG tablet Take 1 tablet (5 mg total) by mouth 3 (three) times daily as needed for  anxiety. 30 tablet 0  . DULoxetine (CYMBALTA) 60 MG capsule Take 60 mg by mouth 2 (two) times daily.     Marland Kitchen  emollient (RADIAGEL) gel Apply 1 application topically as needed for wound care. Reported on 06/19/2015 85 g 0  . fexofenadine (ALLEGRA) 180 MG tablet Take 1 tablet (180 mg total) by mouth daily. For seasonal allergies.    . fluticasone (FLONASE) 50 MCG/ACT nasal spray Place 2 sprays into both nostrils daily. For seasonal allergies. (Patient taking differently: Place 2 sprays into both nostrils daily as needed for allergies. For seasonal allergies.) 16 g 0  . gabapentin (NEURONTIN) 300 MG capsule Take 300 mg by mouth 3 (three) times daily.    . Hydroactive Dressings (KENDALL HYDROGEL GAUZE 4"X4") PADS Apply 1 application topically daily. 1 each 1  . HYDROcodone-acetaminophen (NORCO) 10-325 MG per tablet Take 1 tablet by mouth every 8 (eight) hours as needed. 90 tablet 0  . lithium carbonate 300 MG capsule Take two capsules (681m) by mouth with breakfast. Take 3 capsules (9019m with dinner for mood stabilization. (Patient taking differently: Take 300 mg by mouth 2 (two) times daily with a meal. ) 150 capsule 0  . mineral oil liquid Take 15 mLs by mouth daily as needed for moderate constipation.    . non-metallic deodorant (AJethro PolingMISC Apply 1 application topically daily as needed. Reported on 06/19/2015    . ranitidine (ZANTAC) 75 MG tablet Take 1 tablet (75 mg total) by mouth 2 (two) times daily.    . tamoxifen (NOLVADEX) 20 MG tablet Take 1 tablet (20 mg total) by mouth daily. 90 tablet 3   No current facility-administered medications for this visit.    PHYSICAL EXAMINATION: ECOG PERFORMANCE STATUS: 1 - Symptomatic but completely ambulatory  Filed Vitals:   07/14/15 1134  BP: 160/77  Pulse: 86  Temp: 98.2 F (36.8 C)  Resp: 18   Filed Weights   07/14/15 1134  Weight: 302 lb 4.8 oz (137.122 kg)    GENERAL:alert, no distress and comfortable SKIN: skin color, texture, turgor are normal, no rashes or significant lesions EYES: normal, Conjunctiva are pink and non-injected, sclera  clear OROPHARYNX:no exudate, no erythema and lips, buccal mucosa, and tongue normal  NECK: supple, thyroid normal size, non-tender, without nodularity LYMPH:  no palpable lymphadenopathy in the cervical, axillary or inguinal LUNGS: clear to auscultation and percussion with normal breathing effort HEART: regular rate & rhythm and no murmurs and no lower extremity edema ABDOMEN:abdomen soft, non-tender and normal bowel sounds MUSCULOSKELETAL:no cyanosis of digits and no clubbing  NEURO: alert & oriented x 3 with fluent speech, no focal motor/sensory deficits EXTREMITIES: No lower extremity edema  LABORATORY DATA:  I have reviewed the data as listed   Chemistry      Component Value Date/Time   NA 140 01/21/2015 1200   NA 143 12/10/2014 1223   K 4.2 01/21/2015 1200   K 3.5 12/10/2014 1223   CL 107 01/21/2015 1200   CO2 26 01/21/2015 1200   CO2 27 12/10/2014 1223   BUN 10 01/21/2015 1200   BUN 12.1 12/10/2014 1223   CREATININE 0.92 01/21/2015 1200   CREATININE 0.8 12/10/2014 1223      Component Value Date/Time   CALCIUM 9.4 01/21/2015 1200   CALCIUM 8.8 12/10/2014 1223   ALKPHOS 115 12/10/2014 1223   ALKPHOS 127* 04/11/2014 1017   AST 14 12/10/2014 1223   AST 12 04/11/2014 1017   ALT 14 12/10/2014 1223   ALT 15 04/11/2014  1017   BILITOT <0.30 12/10/2014 1223   BILITOT 0.3 04/11/2014 1017       Lab Results  Component Value Date   WBC 9.5 01/21/2015   HGB 11.7* 01/21/2015   HCT 37.0 01/21/2015   MCV 87.1 01/21/2015   PLT 366 01/21/2015   NEUTROABS 2.9 12/10/2014     ASSESSMENT & PLAN:  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 These include but not limited to insomnia, hot flashes, mood changes, vaginal dryness, and weight gain. Although rare, serious side effects including endometrial cancer, risk of blood clots were also discussed. We strongly believe that the benefits far outweigh the risks. Patient understands these risks and consented to starting treatment. Planned treatment duration is 5 years.  Return to clinic in 3 months for toxicity check and follow-up on tamoxifen  No orders of the defined types were placed in this encounter.   The patient has a good understanding of the overall plan. she agrees with it. she will call with any problems that may develop before the next visit here.   ,  K, MD 07/14/2015      

## 2015-07-16 ENCOUNTER — Encounter: Payer: Self-pay | Admitting: Nurse Practitioner

## 2015-07-16 ENCOUNTER — Telehealth: Payer: Self-pay | Admitting: Hematology and Oncology

## 2015-07-16 ENCOUNTER — Telehealth: Payer: Self-pay

## 2015-07-16 NOTE — Telephone Encounter (Signed)
Called pharmacy back in regards to clarification they requested on prescriptions.  They will order RadiaPlexRx gel per pt request; however, they do not have access to order and receive the Kendall Hydrogel Wound Dressing.  Pharmacy states she has never received this from their location.  I called pt and left VM asking where she had gotten this before so that I could follow up with the correct location.  Pt instructed to call back with this information.

## 2015-07-16 NOTE — Telephone Encounter (Signed)
returned call and s.w pt and r/s appt....pt ok and aware °

## 2015-07-23 ENCOUNTER — Encounter: Payer: Self-pay | Admitting: Nurse Practitioner

## 2015-07-24 ENCOUNTER — Encounter: Payer: Self-pay | Admitting: Nurse Practitioner

## 2015-07-30 ENCOUNTER — Telehealth: Payer: Self-pay | Admitting: *Deleted

## 2015-07-30 NOTE — Telephone Encounter (Signed)
Lm for pt to rtn call if any questions about the care plan that would be mailed to her or if she wants to reschedule survivorship appt.

## 2015-08-05 ENCOUNTER — Encounter: Payer: Self-pay | Admitting: Nurse Practitioner

## 2015-08-05 DIAGNOSIS — C50312 Malignant neoplasm of lower-inner quadrant of left female breast: Secondary | ICD-10-CM

## 2015-08-05 NOTE — Progress Notes (Signed)
The Survivorship Care Plan was mailed to Caitlyn Bautista as she reported not being able to come in to the Survivorship Clinic for an in-person visit at this time. A letter was mailed to her outlining the purpose of the content of the care plan, as well as encouraging her to reach out to me with any questions or concerns.  My business card was included in the correspondence to the patient as well.  A copy of the care plan was also routed/faxed/mailed to Caitlyn Koch, MD, the patient's PCP.  I will not be placing any follow-up appointments to the Survivorship Clinic for Caitlyn Bautista, but I am happy to see her at any time in the future for any survivorship concerns that may arise. Thank you for allowing me to participate in her care!  Caitlyn Bautista, [place] [phone]

## 2015-09-09 ENCOUNTER — Telehealth: Payer: Self-pay | Admitting: *Deleted

## 2015-09-09 ENCOUNTER — Other Ambulatory Visit: Payer: Self-pay | Admitting: Internal Medicine

## 2015-09-09 NOTE — Telephone Encounter (Signed)
Duplicate msg pharmacy sent request.../lmb

## 2015-09-21 DIAGNOSIS — C50312 Malignant neoplasm of lower-inner quadrant of left female breast: Secondary | ICD-10-CM | POA: Diagnosis not present

## 2015-09-21 DIAGNOSIS — N651 Disproportion of reconstructed breast: Secondary | ICD-10-CM | POA: Diagnosis not present

## 2015-09-21 DIAGNOSIS — Z9010 Acquired absence of unspecified breast and nipple: Secondary | ICD-10-CM | POA: Diagnosis not present

## 2015-09-21 DIAGNOSIS — Z9012 Acquired absence of left breast and nipple: Secondary | ICD-10-CM | POA: Diagnosis not present

## 2015-10-13 ENCOUNTER — Ambulatory Visit: Payer: Self-pay | Admitting: Hematology and Oncology

## 2015-10-13 NOTE — Assessment & Plan Note (Deleted)
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

## 2015-10-15 ENCOUNTER — Other Ambulatory Visit: Payer: Self-pay | Admitting: Plastic Surgery

## 2015-10-15 DIAGNOSIS — N651 Disproportion of reconstructed breast: Secondary | ICD-10-CM

## 2015-10-16 ENCOUNTER — Encounter (HOSPITAL_BASED_OUTPATIENT_CLINIC_OR_DEPARTMENT_OTHER): Payer: Self-pay | Admitting: *Deleted

## 2015-10-16 NOTE — Progress Notes (Signed)
   10/16/15 0913  OBSTRUCTIVE SLEEP APNEA  Have you ever been diagnosed with sleep apnea through a sleep study? Yes  If yes, do you have and use a CPAP or BPAP machine every night? 0  Do you snore loudly (loud enough to be heard through closed doors)?  1  Do you often feel tired, fatigued, or sleepy during the daytime (such as falling asleep during driving or talking to someone)? 1  Has anyone observed you stop breathing during your sleep? 0  Do you have, or are you being treated for high blood pressure? 1  BMI more than 35 kg/m2? 1  Age > 60 (1-yes) 1  Female Gender (Yes=1) 0  Obstructive Sleep Apnea Score 5

## 2015-10-16 NOTE — Progress Notes (Signed)
Patient will come in for anesthesia consult before surgery. Hx OSA-no CPAP, BMI 47, HTN, asthma.

## 2015-10-21 NOTE — Progress Notes (Signed)
Anesthesia consult- Dr. Tobias Alexander evaluated pt and will proceed with surgery as scheduled.

## 2015-10-21 NOTE — Progress Notes (Signed)
Pt evaluated for airway check prior to surgery.  Class II. Previous intubation in 01/2016 for breast surgery video-laryngoscopy used with out difficulty.  Ok for surgery at [facility]

## 2015-10-22 ENCOUNTER — Encounter (HOSPITAL_BASED_OUTPATIENT_CLINIC_OR_DEPARTMENT_OTHER): Payer: Self-pay | Admitting: *Deleted

## 2015-10-22 ENCOUNTER — Encounter (HOSPITAL_BASED_OUTPATIENT_CLINIC_OR_DEPARTMENT_OTHER)
Admission: RE | Disposition: A | Discharge status: Home / Self Care | Payer: Self-pay | Source: Ambulatory Visit | Attending: Plastic Surgery

## 2015-10-22 ENCOUNTER — Ambulatory Visit (HOSPITAL_BASED_OUTPATIENT_CLINIC_OR_DEPARTMENT_OTHER)
Admission: RE | Admit: 2015-10-22 | Discharge: 2015-10-22 | Disposition: A | Discharge status: Home / Self Care | Payer: Medicare Other | Source: Ambulatory Visit | Attending: Plastic Surgery | Admitting: Plastic Surgery

## 2015-10-22 ENCOUNTER — Ambulatory Visit (HOSPITAL_BASED_OUTPATIENT_CLINIC_OR_DEPARTMENT_OTHER): Payer: Medicare Other | Admitting: Anesthesiology

## 2015-10-22 DIAGNOSIS — C50912 Malignant neoplasm of unspecified site of left female breast: Secondary | ICD-10-CM | POA: Diagnosis not present

## 2015-10-22 DIAGNOSIS — Z7951 Long term (current) use of inhaled steroids: Secondary | ICD-10-CM | POA: Diagnosis not present

## 2015-10-22 DIAGNOSIS — Z79899 Other long term (current) drug therapy: Secondary | ICD-10-CM | POA: Insufficient documentation

## 2015-10-22 DIAGNOSIS — Z6835 Body mass index (BMI) 35.0-35.9, adult: Secondary | ICD-10-CM | POA: Diagnosis not present

## 2015-10-22 DIAGNOSIS — F319 Bipolar disorder, unspecified: Secondary | ICD-10-CM | POA: Insufficient documentation

## 2015-10-22 DIAGNOSIS — Z79810 Long term (current) use of selective estrogen receptor modulators (SERMs): Secondary | ICD-10-CM | POA: Insufficient documentation

## 2015-10-22 DIAGNOSIS — G4730 Sleep apnea, unspecified: Secondary | ICD-10-CM | POA: Insufficient documentation

## 2015-10-22 DIAGNOSIS — Z923 Personal history of irradiation: Secondary | ICD-10-CM | POA: Diagnosis not present

## 2015-10-22 DIAGNOSIS — M542 Cervicalgia: Secondary | ICD-10-CM | POA: Diagnosis not present

## 2015-10-22 DIAGNOSIS — J45909 Unspecified asthma, uncomplicated: Secondary | ICD-10-CM | POA: Insufficient documentation

## 2015-10-22 DIAGNOSIS — M1990 Unspecified osteoarthritis, unspecified site: Secondary | ICD-10-CM | POA: Insufficient documentation

## 2015-10-22 DIAGNOSIS — Z483 Aftercare following surgery for neoplasm: Secondary | ICD-10-CM | POA: Diagnosis not present

## 2015-10-22 DIAGNOSIS — N6489 Other specified disorders of breast: Secondary | ICD-10-CM | POA: Insufficient documentation

## 2015-10-22 DIAGNOSIS — Z853 Personal history of malignant neoplasm of breast: Secondary | ICD-10-CM | POA: Diagnosis not present

## 2015-10-22 DIAGNOSIS — I10 Essential (primary) hypertension: Secondary | ICD-10-CM | POA: Insufficient documentation

## 2015-10-22 DIAGNOSIS — K219 Gastro-esophageal reflux disease without esophagitis: Secondary | ICD-10-CM | POA: Diagnosis not present

## 2015-10-22 DIAGNOSIS — N62 Hypertrophy of breast: Secondary | ICD-10-CM | POA: Diagnosis not present

## 2015-10-22 DIAGNOSIS — N6012 Diffuse cystic mastopathy of left breast: Secondary | ICD-10-CM | POA: Diagnosis not present

## 2015-10-22 DIAGNOSIS — Z9011 Acquired absence of right breast and nipple: Secondary | ICD-10-CM | POA: Insufficient documentation

## 2015-10-22 DIAGNOSIS — E669 Obesity, unspecified: Secondary | ICD-10-CM | POA: Insufficient documentation

## 2015-10-22 DIAGNOSIS — M549 Dorsalgia, unspecified: Secondary | ICD-10-CM | POA: Insufficient documentation

## 2015-10-22 HISTORY — DX: Sleep apnea, unspecified: G47.30

## 2015-10-22 HISTORY — PX: BREAST REDUCTION SURGERY: SHX8

## 2015-10-22 HISTORY — DX: Gastro-esophageal reflux disease without esophagitis: K21.9

## 2015-10-22 SURGERY — MAMMOPLASTY, REDUCTION
Anesthesia: General | Site: Breast | Laterality: Right

## 2015-10-22 MED ORDER — SUFENTANIL CITRATE 50 MCG/ML IV SOLN
INTRAVENOUS | Status: AC
  Filled 2015-10-22: qty 1

## 2015-10-22 MED ORDER — OXYCODONE HCL 5 MG/5ML PO SOLN: 5.0000 mg | Freq: Once | ORAL | Status: DC | PRN

## 2015-10-22 MED ORDER — PROMETHAZINE HCL 25 MG/ML IJ SOLN: 6.2500 mg | INTRAMUSCULAR | Status: DC | PRN

## 2015-10-22 MED ORDER — GLYCOPYRROLATE 0.2 MG/ML IJ SOLN: 0.2000 mg | Freq: Once | INTRAMUSCULAR | Status: DC | PRN

## 2015-10-22 MED ORDER — CHLORHEXIDINE GLUCONATE CLOTH 2 % EX PADS: 6.0000 | MEDICATED_PAD | Freq: Once | CUTANEOUS | Status: DC

## 2015-10-22 MED ORDER — PHENYLEPHRINE 40 MCG/ML (10ML) SYRINGE FOR IV PUSH (FOR BLOOD PRESSURE SUPPORT)
PREFILLED_SYRINGE | INTRAVENOUS | Status: AC
  Filled 2015-10-22: qty 10

## 2015-10-22 MED ORDER — MIDAZOLAM HCL 2 MG/2ML IJ SOLN
INTRAMUSCULAR | Status: AC
  Filled 2015-10-22: qty 2

## 2015-10-22 MED ORDER — LIDOCAINE HCL (PF) 1 % IJ SOLN
INTRAMUSCULAR | Status: AC
  Filled 2015-10-22: qty 30

## 2015-10-22 MED ORDER — LIDOCAINE HCL (CARDIAC) 20 MG/ML IV SOLN
INTRAVENOUS | Status: DC | PRN
  Administered 2015-10-22: 50 mg via INTRAVENOUS

## 2015-10-22 MED ORDER — EPHEDRINE SULFATE 50 MG/ML IJ SOLN
INTRAMUSCULAR | Status: AC
  Filled 2015-10-22: qty 1

## 2015-10-22 MED ORDER — SUFENTANIL CITRATE 50 MCG/ML IV SOLN
INTRAVENOUS | Status: DC | PRN
  Administered 2015-10-22 (×2): 10 ug via INTRAVENOUS

## 2015-10-22 MED ORDER — ROCURONIUM BROMIDE 100 MG/10ML IV SOLN
INTRAVENOUS | Status: DC | PRN
  Administered 2015-10-22: 50 mg via INTRAVENOUS

## 2015-10-22 MED ORDER — BUPIVACAINE-EPINEPHRINE (PF) 0.25% -1:200000 IJ SOLN
INTRAMUSCULAR | Status: AC
  Filled 2015-10-22: qty 30

## 2015-10-22 MED ORDER — ROCURONIUM BROMIDE 10 MG/ML (PF) SYRINGE
PREFILLED_SYRINGE | INTRAVENOUS | Status: AC
  Filled 2015-10-22: qty 10

## 2015-10-22 MED ORDER — CEFAZOLIN SODIUM-DEXTROSE 2-4 GM/100ML-% IV SOLN
INTRAVENOUS | Status: AC
  Filled 2015-10-22: qty 100

## 2015-10-22 MED ORDER — FENTANYL CITRATE (PF) 100 MCG/2ML IJ SOLN: 50.0000 ug | INTRAMUSCULAR | Status: DC | PRN

## 2015-10-22 MED ORDER — ONDANSETRON HCL 4 MG/2ML IJ SOLN
INTRAMUSCULAR | Status: AC
  Filled 2015-10-22: qty 2

## 2015-10-22 MED ORDER — LIDOCAINE HCL 1 % IJ SOLN
INTRAMUSCULAR | Status: DC | PRN
  Administered 2015-10-22: 20 mL

## 2015-10-22 MED ORDER — SODIUM CHLORIDE 0.9 % IR SOLN
Status: DC | PRN
  Administered 2015-10-22: 500 mL

## 2015-10-22 MED ORDER — DEXAMETHASONE SODIUM PHOSPHATE 4 MG/ML IJ SOLN
INTRAMUSCULAR | Status: DC | PRN
  Administered 2015-10-22: 10 mg via INTRAVENOUS

## 2015-10-22 MED ORDER — HYDROMORPHONE HCL 1 MG/ML IJ SOLN
INTRAMUSCULAR | Status: AC
  Filled 2015-10-22: qty 1

## 2015-10-22 MED ORDER — HYDROMORPHONE HCL 1 MG/ML IJ SOLN
0.2500 mg | INTRAMUSCULAR | Status: DC | PRN
  Administered 2015-10-22 (×3): 0.5 mg via INTRAVENOUS

## 2015-10-22 MED ORDER — SUGAMMADEX SODIUM 500 MG/5ML IV SOLN
INTRAVENOUS | Status: DC | PRN
  Administered 2015-10-22: 300 mg via INTRAVENOUS

## 2015-10-22 MED ORDER — DEXAMETHASONE SODIUM PHOSPHATE 10 MG/ML IJ SOLN
INTRAMUSCULAR | Status: AC
  Filled 2015-10-22: qty 1

## 2015-10-22 MED ORDER — SCOPOLAMINE 1 MG/3DAYS TD PT72
1.0000 | MEDICATED_PATCH | Freq: Once | TRANSDERMAL | Status: DC | PRN
Start: 2015-10-22 — End: 2015-10-22

## 2015-10-22 MED ORDER — OXYCODONE HCL 5 MG PO TABS: 5.0000 mg | ORAL_TABLET | Freq: Once | ORAL | Status: DC | PRN

## 2015-10-22 MED ORDER — BUPIVACAINE HCL (PF) 0.25 % IJ SOLN
INTRAMUSCULAR | Status: AC
  Filled 2015-10-22: qty 30

## 2015-10-22 MED ORDER — CEFAZOLIN SODIUM-DEXTROSE 2-4 GM/100ML-% IV SOLN
2.0000 g | INTRAVENOUS | Status: AC
  Administered 2015-10-22: 3 g via INTRAVENOUS

## 2015-10-22 MED ORDER — LIDOCAINE 2% (20 MG/ML) 5 ML SYRINGE
INTRAMUSCULAR | Status: AC
  Filled 2015-10-22: qty 5

## 2015-10-22 MED ORDER — ATROPINE SULFATE 0.4 MG/ML IV SOSY
PREFILLED_SYRINGE | INTRAVENOUS | Status: AC
  Filled 2015-10-22: qty 2.5

## 2015-10-22 MED ORDER — PROPOFOL 10 MG/ML IV BOLUS
INTRAVENOUS | Status: DC | PRN
  Administered 2015-10-22: 200 mg via INTRAVENOUS

## 2015-10-22 MED ORDER — MIDAZOLAM HCL 2 MG/2ML IJ SOLN
1.0000 mg | INTRAMUSCULAR | Status: DC | PRN
Start: 2015-10-22 — End: 2015-10-22
  Administered 2015-10-22: 2 mg via INTRAVENOUS

## 2015-10-22 MED ORDER — SUCCINYLCHOLINE CHLORIDE 200 MG/10ML IV SOSY
PREFILLED_SYRINGE | INTRAVENOUS | Status: AC
  Filled 2015-10-22: qty 10

## 2015-10-22 MED ORDER — TRIAMCINOLONE ACETONIDE 40 MG/ML IJ SUSP
INTRAMUSCULAR | Status: AC
  Filled 2015-10-22: qty 5

## 2015-10-22 MED ORDER — CEFAZOLIN IN D5W 1 GM/50ML IV SOLN
INTRAVENOUS | Status: AC
  Filled 2015-10-22: qty 50

## 2015-10-22 MED ORDER — TRIAMCINOLONE ACETONIDE 40 MG/ML IJ SUSP
INTRAMUSCULAR | Status: DC | PRN
  Administered 2015-10-22: 5 mL via INTRAMUSCULAR

## 2015-10-22 MED ORDER — LACTATED RINGERS IV SOLN
INTRAVENOUS | Status: DC
  Administered 2015-10-22 (×3): via INTRAVENOUS

## 2015-10-22 MED ORDER — EPINEPHRINE HCL 1 MG/ML IJ SOLN
INTRAMUSCULAR | Status: AC
  Filled 2015-10-22: qty 1

## 2015-10-22 MED ORDER — TRIAMCINOLONE ACETONIDE 40 MG/ML IJ SUSP: INTRAMUSCULAR | Status: DC | PRN

## 2015-10-22 SURGICAL SUPPLY — 64 items
BAG DECANTER FOR FLEXI CONT (MISCELLANEOUS) IMPLANT
BINDER BREAST LRG (GAUZE/BANDAGES/DRESSINGS) IMPLANT
BINDER BREAST MEDIUM (GAUZE/BANDAGES/DRESSINGS) IMPLANT
BINDER BREAST XLRG (GAUZE/BANDAGES/DRESSINGS) IMPLANT
BINDER BREAST XXLRG (GAUZE/BANDAGES/DRESSINGS) IMPLANT
BLADE HEX COATED 2.75 (ELECTRODE) ×3 IMPLANT
BLADE KNIFE PERSONA 10 (BLADE) ×6 IMPLANT
BLADE SURG 15 STRL LF DISP TIS (BLADE) ×1 IMPLANT
BLADE SURG 15 STRL SS (BLADE) ×2
BNDG GAUZE ELAST 4 BULKY (GAUZE/BANDAGES/DRESSINGS) ×6 IMPLANT
CANISTER SUCT 1200ML W/VALVE (MISCELLANEOUS) ×3 IMPLANT
CHLORAPREP W/TINT 26ML (MISCELLANEOUS) ×6 IMPLANT
COVER BACK TABLE 60X90IN (DRAPES) ×3 IMPLANT
COVER MAYO STAND STRL (DRAPES) ×3 IMPLANT
DECANTER SPIKE VIAL GLASS SM (MISCELLANEOUS) IMPLANT
DERMABOND ADVANCED (GAUZE/BANDAGES/DRESSINGS)
DERMABOND ADVANCED .7 DNX12 (GAUZE/BANDAGES/DRESSINGS) IMPLANT
DRAIN CHANNEL 19F RND (DRAIN) ×3 IMPLANT
DRAPE LAPAROSCOPIC ABDOMINAL (DRAPES) ×3 IMPLANT
DRSG PAD ABDOMINAL 8X10 ST (GAUZE/BANDAGES/DRESSINGS) ×12 IMPLANT
ELECT BLADE 4.0 EZ CLEAN MEGAD (MISCELLANEOUS)
ELECT REM PT RETURN 9FT ADLT (ELECTROSURGICAL) ×3
ELECTRODE BLDE 4.0 EZ CLN MEGD (MISCELLANEOUS) IMPLANT
ELECTRODE REM PT RTRN 9FT ADLT (ELECTROSURGICAL) ×1 IMPLANT
EVACUATOR SILICONE 100CC (DRAIN) ×3 IMPLANT
FILTER LIPOSUCTION (MISCELLANEOUS) IMPLANT
GAUZE SPONGE 4X4 12PLY STRL (GAUZE/BANDAGES/DRESSINGS) ×3 IMPLANT
GLOVE BIO SURGEON STRL SZ 6.5 (GLOVE) ×8 IMPLANT
GLOVE BIO SURGEONS STRL SZ 6.5 (GLOVE) ×4
GOWN STRL REUS W/ TWL LRG LVL3 (GOWN DISPOSABLE) ×2 IMPLANT
GOWN STRL REUS W/TWL LRG LVL3 (GOWN DISPOSABLE) ×4
NDL SAFETY ECLIPSE 18X1.5 (NEEDLE) IMPLANT
NEEDLE HYPO 18GX1.5 SHARP (NEEDLE)
NEEDLE HYPO 25X1 1.5 SAFETY (NEEDLE) ×3 IMPLANT
NS IRRIG 1000ML POUR BTL (IV SOLUTION) ×3 IMPLANT
PACK BASIN DAY SURGERY FS (CUSTOM PROCEDURE TRAY) ×3 IMPLANT
PAD ALCOHOL SWAB (MISCELLANEOUS) ×3 IMPLANT
PENCIL BUTTON HOLSTER BLD 10FT (ELECTRODE) ×3 IMPLANT
SLEEVE SCD COMPRESS KNEE MED (MISCELLANEOUS) ×3 IMPLANT
SPONGE LAP 18X18 X RAY DECT (DISPOSABLE) ×9 IMPLANT
STRIP SUTURE WOUND CLOSURE 1/2 (SUTURE) ×6 IMPLANT
SUT MNCRL AB 4-0 PS2 18 (SUTURE) ×12 IMPLANT
SUT MON AB 3-0 SH 27 (SUTURE) ×4
SUT MON AB 3-0 SH27 (SUTURE) ×2 IMPLANT
SUT MON AB 5-0 PS2 18 (SUTURE) ×6 IMPLANT
SUT PDS 3-0 CT2 (SUTURE)
SUT PDS AB 2-0 CT2 27 (SUTURE) IMPLANT
SUT PDS II 3-0 CT2 27 ABS (SUTURE) IMPLANT
SUT SILK 3 0 PS 1 (SUTURE) ×3 IMPLANT
SUT VIC AB 3-0 SH 27 (SUTURE)
SUT VIC AB 3-0 SH 27X BRD (SUTURE) IMPLANT
SUT VICRYL 4-0 PS2 18IN ABS (SUTURE) IMPLANT
SYR 3ML 23GX1 SAFETY (SYRINGE) IMPLANT
SYR 50ML LL SCALE MARK (SYRINGE) IMPLANT
SYR BULB IRRIGATION 50ML (SYRINGE) ×3 IMPLANT
SYR CONTROL 10ML LL (SYRINGE) ×3 IMPLANT
TAPE MEASURE VINYL STERILE (MISCELLANEOUS) ×3 IMPLANT
TOWEL OR 17X24 6PK STRL BLUE (TOWEL DISPOSABLE) ×9 IMPLANT
TUBE CONNECTING 20'X1/4 (TUBING) ×1
TUBE CONNECTING 20X1/4 (TUBING) ×2 IMPLANT
TUBING INFILTRATION IT-10001 (TUBING) IMPLANT
TUBING SET GRADUATE ASPIR 12FT (MISCELLANEOUS) IMPLANT
UNDERPAD 30X30 (UNDERPADS AND DIAPERS) IMPLANT
YANKAUER SUCT BULB TIP NO VENT (SUCTIONS) ×3 IMPLANT

## 2015-10-22 NOTE — Brief Op Note (Signed)
10/22/2015  9:54 AM  PATIENT:  Caitlyn Bautista  65 y.o. female  PRE-OPERATIVE DIAGNOSIS:  LEFT BREAST CANCER  POST-OPERATIVE DIAGNOSIS:  LEFT BREAST CANCER  PROCEDURE:  Procedure(s): RIGHT MAMMARY REDUCTION  (BREAST) FOR SYMMETRY (Right)  SURGEON:  Surgeon(s) and Role:    * Loel Lofty Mesa Janus, DO - Primary  PHYSICIAN ASSISTANT: Shawn Rayburn, PA  ASSISTANTS: none   ANESTHESIA:   general  EBL:  No intake/output data recorded.  BLOOD ADMINISTERED:none  DRAINS: JP in right breast pocket   LOCAL MEDICATIONS USED:  MARCAINE and LIDOCAINE   SPECIMEN:  Right breast tissue  DISPOSITION OF SPECIMEN:  PATHOLOGY  COUNTS:  YES  TOURNIQUET:  * No tourniquets in log *  DICTATION: .Dragon Dictation  PLAN OF CARE: Discharge to home after PACU  PATIENT DISPOSITION:  PACU - hemodynamically stable.   Delay start of Pharmacological VTE agent (>24hrs) due to surgical blood loss or risk of bleeding: no

## 2015-10-22 NOTE — Discharge Instructions (Signed)
May shower starting Saturday No heavy lifting Continue binder or sports bra   Post Anesthesia Home Care Instructions  Activity: Get plenty of rest for the remainder of the day. A responsible adult should stay with you for 24 hours following the procedure.  For the next 24 hours, DO NOT: -Drive a car -Paediatric nurse -Drink alcoholic beverages -Take any medication unless instructed by your physician -Make any legal decisions or sign important papers.  Meals: Start with liquid foods such as gelatin or soup. Progress to regular foods as tolerated. Avoid greasy, spicy, heavy foods. If nausea and/or vomiting occur, drink only clear liquids until the nausea and/or vomiting subsides. Call your physician if vomiting continues.  Special Instructions/Symptoms: Your throat may feel dry or sore from the anesthesia or the breathing tube placed in your throat during surgery. If this causes discomfort, gargle with warm salt water. The discomfort should disappear within 24 hours.  If you had a scopolamine patch placed behind your ear for the management of post- operative nausea and/or vomiting:  1. The medication in the patch is effective for 72 hours, after which it should be removed.  Wrap patch in a tissue and discard in the trash. Wash hands thoroughly with soap and water. 2. You may remove the patch earlier than 72 hours if you experience unpleasant side effects which may include dry mouth, dizziness or visual disturbances. 3. Avoid touching the patch. Wash your hands with soap and water after contact with the patch.

## 2015-10-22 NOTE — Interval H&P Note (Signed)
History and Physical Interval Note:  10/22/2015 9:46 AM  Caitlyn Bautista  has presented today for surgery, with the diagnosis of LEFT BREAST CANCER  The various methods of treatment have been discussed with the patient and family. After consideration of risks, benefits and other options for treatment, the patient has consented to  Procedure(s): RIGHT MAMMARY REDUCTION  (BREAST) FOR SYMMETRY (Right) as a surgical intervention .  The patient's history has been reviewed, patient examined, no change in status, stable for surgery.  I have reviewed the patient's chart and labs.  Questions were answered to the patient's satisfaction.     Wallace Going

## 2015-10-22 NOTE — H&P (Signed)
Caitlyn Bautista is an 65 y.o. female.   Chief Complaint: breast asymmetry HPI: Caitlyn Bautista is a 65 yo female who is seen for pre operative history and physical prior to right breast reduction for symmetry after breast cancer treatment on the left with partial mastectomy. History:  She has a history of left breast cancer.  She underwent a left partial mastectomy.  She then had a left reduction at the same time followed by radiation.  The last dose of radiation was in march.  She is doing well overall.  She has significant asymmetry.   Now the biggest problem is 3 cup size difference between the breasts due to the mastectomy. Her right breast is extremely large and fairly symmetric.  She has hyperpigmentation of the inframammary area on both sides.  The sternal to nipple distance on the right is 35.  The IMF distance is 9 cm.  She is 5 feet 7 inches tall and weighs 280 pounds.  Preoperative bra size was= 42DDD cup.  The estimated excess breast tissue to be removed at the time of surgery is 500 grams on the right.   Past Medical History:  Diagnosis Date  . Allergy   . Anemia   . Anxiety   . Arachnoiditis   . Arthritis   . Asthma   . Bipolar disorder ([place])   . Cancer of lower-inner quadrant of female breast ([place]) 12/05/2014  . Carpal tunnel syndrome   . Carpal tunnel syndrome, bilateral   . Cervical herniated disc   . Depression    bipolar depression  . Fibromyalgia   . GERD (gastroesophageal reflux disease)   . History of bronchitis   . History of chicken pox   . History of pneumonia 1996  . Hyperlipidemia    pt. denies  . Hypertension   . Incontinence of urine in female   . PTSD (post-traumatic stress disorder)   . Scoliosis   . Sleep apnea    does not use her CPAP  . Thyroid disease    "very mild"  . Urinary frequency   . Urinary urgency     Past Surgical History:  Procedure Laterality Date  . ABDOMINAL HYSTERECTOMY  1990's  . BREAST LUMPECTOMY  1970's   right  . BREAST  LUMPECTOMY WITH NEEDLE LOCALIZATION Left 01/28/2015   Procedure: LEFT BREAST LUMPECTOMY WITH NEEDLE LOCALIZATION;  Surgeon: [name], MD;  Location: [place];  Service: General;  Laterality: Left;  . BREAST REDUCTION SURGERY Left 01/28/2015   Procedure: BREAST REDUCTION;  Surgeon: Loel Lofty Ashlin Kreps, DO;  Location: [place];  Service: Plastics;  Laterality: Left;  . COLONOSCOPY    . DILATION AND CURETTAGE OF UTERUS    . TUBAL LIGATION      Family History  Problem Relation Age of Onset  . Cancer Mother     unspecified type; dx. 34 or younger  . Ovarian cancer Sister 8  . Breast cancer Maternal Aunt     bilateral - dx. 30s, 10s  . Ovarian cancer Maternal Grandmother 100  . Ovarian cancer Sister 8  . Cervical cancer Sister 52  . Pancreatic cancer Maternal Uncle     dx. 50s-60s  . Cancer Cousin     either ovarian or cervical cancer  . Cancer Daughter     hx of TAH-BSO due to fibroids and bleeding  . Arthritis Other     Parent  . Hypertension Other     Parent  . Arthritis Other  Grandparent  . Hypertension Other     Grandparent  . Arthritis Other     Other Blood Relative  . Hypertension Other     Other Blood Relative  . Diabetes Other     Other Blood Relative   Social History:  reports that she has never smoked. She has never used smokeless tobacco. She reports that she drinks alcohol. She reports that she does not use drugs.  Allergies:  Allergies  Allergen Reactions  . Ace Inhibitors Other (See Comments) and Nausea And Vomiting    Cough, asthma attack  . Other     allergic to dust mites; grass and various others  . Decongestant [Pseudoephedrine Hcl] Other (See Comments) and Rash    High blood pressure  . Nsaids Swelling and Rash    Anaprox    No prescriptions prior to admission.    No results found for this or any previous visit (from the past 48 hour(s)). No results found.  Review of Systems  Constitutional: Negative.   HENT: Negative.   Eyes:  Negative.   Respiratory: Negative.   Cardiovascular: Negative.   Gastrointestinal: Negative.   Genitourinary: Negative.   Musculoskeletal: Negative.   Skin: Negative.   Neurological: Negative.   Psychiatric/Behavioral: Negative.     Height 5\' 7"  (1.702 m), weight (!) 137 kg (302 lb). Physical Exam  Constitutional: She is oriented to person, place, and time. She appears well-developed and well-nourished.  HENT:  Head: Normocephalic and atraumatic.  Eyes: Conjunctivae and EOM are normal. Pupils are equal, round, and reactive to light.  Cardiovascular: Normal rate.   Respiratory: Effort normal. No respiratory distress.  GI: Soft.  Neurological: She is alert and oriented to person, place, and time.  Skin: Skin is warm.  Psychiatric: She has a normal mood and affect. Her behavior is normal. Judgment and thought content normal.     Assessment/Plan Breast symmetry from breast cancer surgery with right reduction.  Wallace Going, DO 10/22/2015, 7:11 AM

## 2015-10-22 NOTE — Op Note (Signed)
Breast Reduction Op note:    DATE OF PROCEDURE: 10/22/2015  LOCATION: [place]  SURGEON: Lyndee Leo Sanger Darien Mignogna, DO  ASSISTANT: Shawn Rayburn, PA  PREOPERATIVE DIAGNOSIS 1. Breast asymmetry after treatment for breast cancer / partial mastectomy 2. Macromastia 3. Neck Pain / Back Pain 4. History of breast cancer - left  POSTOPERATIVE DIAGNOSIS 1. Breast asymmetry after treatment for breast cancer / partial mastectomy 2. Macromastia 3. Neck Pain / Back Pain 4. History of breast cancer - left  PROCEDURES 1. Right breast reduction.  Right reduction [id number]  COMPLICATIONS: None.  DRAINS: jp in right breast  INDICATIONS FOR PROCEDURE @FNAMEA @ [place] is a 65 y.o. year-old female born on [birth year],with a history of symptomatic macromastia with concominant back pain, neck pain, shoulder grooving from her bra.  She also had left breast cancer and underwent a partial mastectomy with reduction for reconstruction several months ago.  She now presents for symmetry surgery for a right breast reduction. MRN: [id number]  CONSENT Informed consent was obtained directly from the patient. The risks, benefits and alternatives were fully discussed. Specific risks including but not limited to bleeding, infection, hematoma, seroma, scarring, pain, nipple necrosis, asymmetry, poor cosmetic results, and need for further surgery were discussed. The patient had ample opportunity to have her questions answered to her satisfaction.  DESCRIPTION OF PROCEDURE  Patient was brought into the operating room and placed in a supine position.  SCDs were placed and appropriate padding was performed.  Antibiotics were given. The patient underwent general anesthesia and the chest was prepped and draped in a sterile fashion.  A timeout was performed and all information was confirmed to be correct.  RIGHT BREAST: Preoperative markings were confirmed.  Incision lines were injected with 1%  Xylocaine with epinephrine.  After waiting for vasoconstriction, the marked lines were incised.  An inferior pedical breast reduction was performed by de-epithelializing the pedicle, using bovie to create the lateral and medial pedicles, and removing breast tissue from the superior, lateral, and medial portions of the breast.  Care was taken to not undermine the breast pedicle. Hemostasis was achieved.  The nipple was gently rotated into position and the skin was temporarily closed with staples.  The patient was sat upright and size and shape symmetry was confirmed.  The pocket was irrigated, a drain placed, and hemostasis confirmed.  The deep tissues were approximated with 3-0 Vicryl sutures and the skin was closed with deep dermal and subcuticular 4-0 Monocryl sutures followed by 5-0 Monocryl.  The nipple areola complex was brought out with the skin de-epithelialized at the location to make place for the complex.  The area was secured with 4-0 Monocryl at the deep layers followed by 5-0 Monocryl.  The nipple and skin flaps had good capillary refill at the end of the procedure. The patient tolerated the procedure well. The patient was allowed to wake from anesthesia and taken to the recovery room in satisfactory condition.

## 2015-10-22 NOTE — Anesthesia Postprocedure Evaluation (Signed)
Anesthesia Post Note  Patient: Caitlyn Bautista  Procedure(s) Performed: Procedure(s) (LRB): RIGHT MAMMARY REDUCTION  (BREAST) FOR SYMMETRY (Right)  Patient location during evaluation: PACU Anesthesia Type: General Level of consciousness: awake and alert Pain management: pain level controlled Vital Signs Assessment: post-procedure vital signs reviewed and stable Respiratory status: spontaneous breathing, nonlabored ventilation, respiratory function stable and patient connected to nasal cannula oxygen Cardiovascular status: blood pressure returned to baseline and stable Postop Assessment: no signs of nausea or vomiting Anesthetic complications: no    Last Vitals:  Vitals:   10/22/15 1200 10/22/15 1215  BP: 124/76 127/86  Pulse: 86 85  Resp: 12 15  Temp:      Last Pain:  Vitals:   10/22/15 1223  TempSrc:   PainSc: 8                  Marrion Finan JENNETTE

## 2015-10-22 NOTE — Anesthesia Preprocedure Evaluation (Addendum)
Anesthesia Evaluation  Patient identified by MRN, date of birth, ID band Patient awake    Reviewed: Allergy & Precautions, NPO status , Patient's Chart, lab work & pertinent test results  History of Anesthesia Complications Negative for: history of anesthetic complications  Airway Mallampati: III  TM Distance: >3 FB Neck ROM: Full    Dental no notable dental hx. (+) Dental Advisory Given, Loose, Poor Dentition, Chipped   Pulmonary asthma , sleep apnea ,    Pulmonary exam normal breath sounds clear to auscultation       Cardiovascular hypertension, negative cardio ROS Normal cardiovascular exam Rhythm:Regular Rate:Normal     Neuro/Psych PSYCHIATRIC DISORDERS Anxiety Depression Bipolar Disorder  Neuromuscular disease    GI/Hepatic Neg liver ROS, GERD  Medicated and Controlled,  Endo/Other  negative endocrine ROS  Renal/GU negative Renal ROS  negative genitourinary   Musculoskeletal  (+) Arthritis , Fibromyalgia -  Abdominal (+) + obese,   Peds negative pediatric ROS (+)  Hematology negative hematology ROS (+)   Anesthesia Other Findings   Reproductive/Obstetrics negative OB ROS                            Anesthesia Physical Anesthesia Plan  ASA: III  Anesthesia Plan: General   Post-op Pain Management:    Induction: Intravenous  Airway Management Planned: Oral ETT  Additional Equipment:   Intra-op Plan:   Post-operative Plan: Extubation in OR  Informed Consent: I have reviewed the patients History and Physical, chart, labs and discussed the procedure including the risks, benefits and alternatives for the proposed anesthesia with the patient or authorized representative who has indicated his/her understanding and acceptance.   Dental advisory given  Plan Discussed with: CRNA  Anesthesia Plan Comments:        Anesthesia Quick Evaluation

## 2015-10-22 NOTE — Anesthesia Procedure Notes (Signed)
Procedure Name: Intubation Date/Time: 10/22/2015 10:51 AM Performed by: Melynda Ripple D Pre-anesthesia Checklist: Patient identified, Emergency Drugs available, Suction available and Patient being monitored Patient Re-evaluated:Patient Re-evaluated prior to inductionOxygen Delivery Method: Circle system utilized Preoxygenation: Pre-oxygenation with 100% oxygen Intubation Type: IV induction Ventilation: Mask ventilation without difficulty Laryngoscope Size: Glidescope Grade View: Grade II Tube type: Oral Tube size: 7.0 mm Number of attempts: 2 Airway Equipment and Method: Stylet,  Oral airway and Video-laryngoscopy Placement Confirmation: ETT inserted through vocal cords under direct vision,  positive ETCO2 and breath sounds checked- equal and bilateral Secured at: 22 cm Tube secured with: Tape Dental Injury: Teeth and Oropharynx as per pre-operative assessment  Difficulty Due To: Difficult Airway- due to dentition, Difficult Airway- due to large tongue and Difficulty was anticipated

## 2015-10-22 NOTE — Transfer of Care (Signed)
Immediate Anesthesia Transfer of Care Note  Patient: Caitlyn Bautista  Procedure(s) Performed: Procedure(s): RIGHT MAMMARY REDUCTION  (BREAST) FOR SYMMETRY (Right)  Patient Location: PACU  Anesthesia Type:General  Level of Consciousness: awake, alert  and oriented  Airway & Oxygen Therapy: Patient Spontanous Breathing and Patient connected to face mask oxygen  Post-op Assessment: Report given to RN and Post -op Vital signs reviewed and stable  Post vital signs: Reviewed and stable  Last Vitals:  Vitals:   10/22/15 0820 10/22/15 1155  BP: 138/79   Pulse: 88 90  Resp: 18 15  Temp: 37.3 C     Last Pain:  Vitals:   10/22/15 0820  TempSrc: Oral      Patients Stated Pain Goal: 0 ([id number])  Complications: No apparent anesthesia complications

## 2015-10-23 ENCOUNTER — Encounter (HOSPITAL_BASED_OUTPATIENT_CLINIC_OR_DEPARTMENT_OTHER): Payer: Self-pay | Admitting: Plastic Surgery

## 2015-11-19 ENCOUNTER — Telehealth: Payer: Self-pay | Admitting: Hematology and Oncology

## 2015-11-19 NOTE — Telephone Encounter (Signed)
Patient called to have 10/13/2015 appointment rescheduled to 11/23/2015. Missed 08/08 appointment due to surgery.

## 2015-11-19 NOTE — Telephone Encounter (Signed)
Patient called again to reschedule September appointment to October.

## 2015-11-23 ENCOUNTER — Ambulatory Visit: Payer: Self-pay | Admitting: Hematology and Oncology

## 2015-11-25 ENCOUNTER — Encounter: Payer: Self-pay | Admitting: Internal Medicine

## 2015-12-10 ENCOUNTER — Ambulatory Visit: Payer: Self-pay | Admitting: Hematology and Oncology

## 2015-12-10 NOTE — Assessment & Plan Note (Deleted)
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

## 2015-12-10 NOTE — Progress Notes (Deleted)
Patient Care Team: Hoyt Koch, MD as PCP - General (Internal Medicine) [name], MD as Consulting Physician (General Surgery) Nicholas Lose, MD as Consulting Physician (Hematology and Oncology) Arloa Koh, MD as Consulting Physician (Radiation Oncology) Mauro Kaufmann, RN as Registered Nurse Rockwell Germany, RN as Registered Nurse Sylvan Cheese, NP as Nurse Practitioner (Nurse Practitioner)  DIAGNOSIS: Primary cancer of lower-inner quadrant of left female breast [facility])   Staging form: Breast, AJCC 7th Edition   - Clinical stage from 12/10/2014: Stage 0 (Tis (DCIS), N0, M0) - Unsigned   - Pathologic stage from 01/30/2015: Stage Unknown (Tis (DCIS), NX, cM0) - Unsigned         Staging comments: Staged on surgical specimen by Dr. 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      01/28/2015 Surgery    Left lumpectomy: DCIS 1.8 cm, margins negative, closest margin 0.3 cm, ALH,fibrocystic changes with calcifications, ER+ 100%, PR+ 100% low to intermediate grade      01/28/2015 Pathologic Stage    Stage 0: Tis Nx      04/08/2015 - 05/18/2015 Radiation Therapy    Adjuvant radiation therapy (Dr.Squire): Left breast / 50.4 Gy in 28 fractions      07/14/2015 -  Anti-estrogen oral therapy    Tamoxifen 20 mg daily to begin ~07/14/2015. Planned duration of therapy 5 years.      08/05/2015 Survivorship    Survivorship care plan mailed to  patient in lieu of in person visit       CHIEF COMPLIANT: Follow-up on tamoxifen therapy  INTERVAL HISTORY: Caitlyn Bautista is a 65 year old with above-mentioned history of left breast cancer underwent lumpectomy followed by radiation and is currently on adjuvant tamoxifen therapy she is here for toxicity check and tamoxifen. She reports no major problems or concerns. She occasionally gets hot flashes. She is still struggling with severe weight gain.  REVIEW OF SYSTEMS:   Constitutional: Denies fevers, chills or abnormal weight loss Eyes: Denies blurriness of vision Ears, nose, mouth, throat, and face: Denies mucositis or sore throat Respiratory: Denies cough, dyspnea or wheezes Cardiovascular: Denies palpitation, chest discomfort Gastrointestinal:  Denies nausea, heartburn or change in bowel habits Skin: Denies abnormal skin rashes Lymphatics: Denies new lymphadenopathy or easy bruising Neurological:Denies numbness, tingling or new weaknesses Behavioral/Psych: Mood is stable, no new changes  Extremities: No lower extremity edema Breast: *** denies any pain or lumps or nodules in either breasts All other systems were reviewed with the patient and are negative.  I have reviewed the past medical history, past surgical history, social history and family history with the patient and they are unchanged from previous note.  ALLERGIES:  is allergic to ace inhibitors; other; decongestant [pseudoephedrine hcl]; and nsaids.  MEDICATIONS:  Current Outpatient Prescriptions  Medication Sig Dispense Refill  . albuterol (PROVENTIL HFA;VENTOLIN HFA) 108 (90 BASE) MCG/ACT inhaler Inhale 2 puffs into the  lungs daily. For wheezing. 1 Inhaler 0  . amLODipine (NORVASC) 10 MG tablet Take 1 tablet (10 mg total) by mouth daily. Yearly physical w/labs is due must see MD for refills 30 tablet 0  . buPROPion (WELLBUTRIN XL) 150 MG 24 hr tablet Take 1 tablet (150 mg total) by mouth daily. 30 tablet 0  . cephALEXin  (KEFLEX) 500 MG capsule Take 500 mg by mouth 4 (four) times daily.    . diazepam (VALIUM) 5 MG tablet Take 1 tablet (5 mg total) by mouth 3 (three) times daily as needed for anxiety. 30 tablet 0  . DULoxetine (CYMBALTA) 60 MG capsule Take 60 mg by mouth 2 (two) times daily.     Marland Kitchen emollient (RADIAGEL) gel Apply 1 application topically as needed for wound care. Reported on 06/19/2015 85 g 0  . fexofenadine (ALLEGRA) 180 MG tablet Take 1 tablet (180 mg total) by mouth daily. For seasonal allergies.    . fluticasone (FLONASE) 50 MCG/ACT nasal spray Place 2 sprays into both nostrils daily. For seasonal allergies. (Patient taking differently: Place 2 sprays into both nostrils daily as needed for allergies. For seasonal allergies.) 16 g 0  . Ginger, Zingiber officinalis, (GINGER PO) Take by mouth.    . Hydroactive Dressings (KENDALL HYDROGEL GAUZE 4"X4") PADS Apply 1 application topically daily. 1 each 1  . HYDROcodone-acetaminophen (NORCO) 10-325 MG per tablet Take 1 tablet by mouth every 8 (eight) hours as needed. 90 tablet 0  . lithium carbonate 300 MG capsule Take two capsules (654m) by mouth with breakfast. Take 3 capsules (9040m with dinner for mood stabilization. (Patient taking differently: Take 300 mg by mouth 2 (two) times daily with a meal. ) 150 capsule 0  . mineral oil liquid Take 15 mLs by mouth daily as needed for moderate constipation.    . non-metallic deodorant (AJethro PolingMISC Apply 1 application topically daily as needed. Reported on 06/19/2015    . ranitidine (ZANTAC) 75 MG tablet Take 1 tablet (75 mg total) by mouth 2 (two) times daily.    . tamoxifen (NOLVADEX) 20 MG tablet Take 1 tablet (20 mg total) by mouth daily. 90 tablet 3  . Turmeric Curcumin 500 MG CAPS Take by mouth.     No current facility-administered medications for this visit.     PHYSICAL EXAMINATION: ECOG PERFORMANCE STATUS: {CHL ONC ECOG PS:[phone]}  There were no vitals filed for this visit. There were no vitals  filed for this visit.  GENERAL:alert, no distress and comfortable SKIN: skin color, texture, turgor are normal, no rashes or significant lesions EYES: normal, Conjunctiva are pink and non-injected, sclera clear OROPHARYNX:no exudate, no erythema and lips, buccal mucosa, and tongue normal  NECK: supple, thyroid normal size, non-tender, without nodularity LYMPH:  no palpable lymphadenopathy in the cervical, axillary or inguinal LUNGS: clear to auscultation and percussion with normal breathing effort HEART: regular rate & rhythm and no murmurs and no lower extremity edema ABDOMEN:abdomen soft, non-tender and normal bowel sounds MUSCULOSKELETAL:no cyanosis of digits and no clubbing  NEURO: alert & oriented x 3 with fluent speech, no focal motor/sensory deficits EXTREMITIES: No lower extremity edema BREAST:*** No palpable masses or nodules in either right or left breasts. No palpable axillary supraclavicular or infraclavicular adenopathy no breast tenderness or nipple discharge. (exam performed in the presence of a chaperone)  LABORATORY DATA:  I have reviewed the data as listed   Chemistry      Component Value Date/Time   NA 140 01/21/2015 1200  NA 143 12/10/2014 1223   K 4.2 01/21/2015 1200   K 3.5 12/10/2014 1223   CL 107 01/21/2015 1200   CO2 26 01/21/2015 1200   CO2 27 12/10/2014 1223   BUN 10 01/21/2015 1200   BUN 12.1 12/10/2014 1223   CREATININE 0.92 01/21/2015 1200   CREATININE 0.8 12/10/2014 1223      Component Value Date/Time   CALCIUM 9.4 01/21/2015 1200   CALCIUM 8.8 12/10/2014 1223   ALKPHOS 115 12/10/2014 1223   AST 14 12/10/2014 1223   ALT 14 12/10/2014 1223   BILITOT <0.30 12/10/2014 1223       Lab Results  Component Value Date   WBC 9.5 01/21/2015   HGB 11.7 (L) 01/21/2015   HCT 37.0 01/21/2015   MCV 87.1 01/21/2015   PLT 366 01/21/2015   NEUTROABS 2.9 12/10/2014     ASSESSMENT & PLAN:  Primary cancer of lower-inner quadrant of left female breast  ([place]) left lumpectomy 01/28/2015: DCIS 1.8 cm, margins negative, closest margin 0.3 cm, ALH,fibrocystic changes with calcifications, Tis N0 stage 0, ER 100%, PR 100% low to intermediate grade Completed adjuvant radiation therapy 04/08/2015 to 05/18/2015 (Dr.Squire)  Current treatment: Antiestrogen therapy with tamoxifen 5 years started 07/14/2015  Tamoxifen toxicities:  Return to clinic in 6 months for breast exams and follow-up and surveillance.      No orders of the defined types were placed in this encounter.  The patient has a good understanding of the overall plan. she agrees with it. she will call with any problems that may develop before the next visit here.   Rulon Eisenmenger, MD 12/10/15

## 2016-02-08 ENCOUNTER — Ambulatory Visit: Payer: Medicare Other | Admitting: Physical Therapy

## 2016-02-12 ENCOUNTER — Ambulatory Visit: Payer: Medicare Other | Attending: Plastic Surgery | Admitting: Physical Therapy

## 2016-02-12 ENCOUNTER — Telehealth: Payer: Self-pay | Admitting: Hematology and Oncology

## 2016-02-12 DIAGNOSIS — I890 Lymphedema, not elsewhere classified: Secondary | ICD-10-CM

## 2016-02-12 DIAGNOSIS — L599 Disorder of the skin and subcutaneous tissue related to radiation, unspecified: Secondary | ICD-10-CM | POA: Diagnosis not present

## 2016-02-12 DIAGNOSIS — M25612 Stiffness of left shoulder, not elsewhere classified: Secondary | ICD-10-CM | POA: Insufficient documentation

## 2016-02-12 DIAGNOSIS — M25511 Pain in right shoulder: Secondary | ICD-10-CM | POA: Diagnosis not present

## 2016-02-12 DIAGNOSIS — M25611 Stiffness of right shoulder, not elsewhere classified: Secondary | ICD-10-CM | POA: Diagnosis not present

## 2016-02-12 DIAGNOSIS — G8929 Other chronic pain: Secondary | ICD-10-CM | POA: Diagnosis not present

## 2016-02-12 DIAGNOSIS — M25512 Pain in left shoulder: Secondary | ICD-10-CM | POA: Diagnosis not present

## 2016-02-12 NOTE — Telephone Encounter (Signed)
lvm to inform pt of 12/13 appt date/time per LOS

## 2016-02-12 NOTE — Therapy (Addendum)
[place], [place], [address] Phone: [phone]   Fax:  [phone]  Physical Therapy Evaluation  Patient Details  Name: Caitlyn Bautista MRN: [id number] Date of Birth: 1951-01-30 Referring Provider: Erlinda Hong  Encounter Date: 02/12/2016      PT End of Session - 02/12/16 1052    Visit Number 1   Number of Visits 9   Date for PT Re-Evaluation 04/06/16  pt will not be able to start til Jan. due to finances    PT Start Time 0810   PT Stop Time 0850   PT Time Calculation (min) 40 min   Activity Tolerance Patient tolerated treatment well      Past Medical History:  Diagnosis Date  . Allergy   . Anemia   . Anxiety   . Arachnoiditis   . Arthritis   . Asthma   . Bipolar disorder ([place])   . Cancer of lower-inner quadrant of female breast ([place]) 12/05/2014  . Carpal tunnel syndrome   . Carpal tunnel syndrome, bilateral   . Cervical herniated disc   . Depression    bipolar depression  . Fibromyalgia   . GERD (gastroesophageal reflux disease)   . History of bronchitis   . History of chicken pox   . History of pneumonia 1996  . Hyperlipidemia    pt. denies  . Hypertension   . Incontinence of urine in female   . PTSD (post-traumatic stress disorder)   . Scoliosis   . Sleep apnea    does not use her CPAP  . Thyroid disease    "very mild"  . Urinary frequency   . Urinary urgency     Past Surgical History:  Procedure Laterality Date  . ABDOMINAL HYSTERECTOMY  1990's  . BREAST LUMPECTOMY  1970's   right  . BREAST LUMPECTOMY WITH NEEDLE LOCALIZATION Left 01/28/2015   Procedure: LEFT BREAST LUMPECTOMY WITH NEEDLE LOCALIZATION;  Surgeon: [name], MD;  Location: [facility];  Service: General;  Laterality: Left;  . BREAST REDUCTION SURGERY Left 01/28/2015   Procedure: BREAST REDUCTION;  Surgeon: Loel Lofty Dillingham, DO;  Location: [facility];  Service: Plastics;  Laterality: Left;  . BREAST  REDUCTION SURGERY Right 10/22/2015   Procedure: RIGHT MAMMARY REDUCTION  (BREAST) FOR SYMMETRY;  Surgeon: Wallace Going, DO;  Location: [facility];  Service: Plastics;  Laterality: Right;  . COLONOSCOPY    . DILATION AND CURETTAGE OF UTERUS    . TUBAL LIGATION      There were no vitals filed for this visit.       Subjective Assessment - 02/12/16 0821    Subjective left and right breast swelling, pain, and lateral chest. , swelling is better if she wears a sports bra. Pt reports her visits will be limited by a $40 copay and limited financial resources    Pertinent History left breast cancer with Partial mastectomy 01/28/2015 (Dr. Marlou Starks)  followed by radiation that ended in March with skin discoloration wtih no open areas.  She reports aspiration at lateral chest .She said she gained > 40 punds during radiation. She has right breast reduction on 8//17/2017 She did not have to have radiation.  Past history includes fibromyalgia, arachnoitis, chronic pain, osteoathritis, carpal tunnel, herniated disc and scolisis    Currently in Pain? Yes   Pain Score 7    Pain Location Breast   Pain Orientation Left;Right   Pain Descriptors / Indicators Heaviness;Burning   Pain Type  Chronic pain   Pain Onset More than a month ago            [facility] PT Assessment - 02/12/16 0001      Assessment   Medical Diagnosis left breast cancer    Referring Provider Shawn Rayburn   Onset Date/Surgical Date 01/28/15   Hand Dominance Left     Precautions   Precaution Comments lifting precautions      Restrictions   Weight Bearing Restrictions No     Balance Screen   Has the patient fallen in the past 6 months Yes  pt has life alert , will not address balance this episode    How many times? 3   Has the patient had a decrease in activity level because of a fear of falling?  Yes   Is the patient reluctant to leave their home because of a fear of falling?  Yes  because of pain, and because  of anxiety      Home Environment   Living Environment Private residence   Living Arrangements Alone   Available Help at Discharge Family;Available PRN/intermittently   Type of Home Other(Comment)  townhome    Home Access Stairs to enter   Entrance Stairs-Number of Steps 3  needs assist on stairs    Home Equipment Other (comment)   Additional Comments rollator     Prior Function   Level of Independence Independent with household mobility with device   Vocation On disability   Leisure wants to get back to art field, limited exercise,      Cognition   Overall Cognitive Status Within Functional Limits for tasks assessed     Observation/Other Assessments   Observations pt comes in with rollator walker.  She is obese She moves slowly and needs extra time to put  on her blouse and coat.    Skin Integrity intact    Other Surveys  --  Lymphedema LIfe Impact Scale 54 or 79% impaired      Sensation   Light Touch Not tested     Coordination   Gross Motor Movements are Fluid and Coordinated Not tested     Posture/Postural Control   Posture/Postural Control Postural limitations   Postural Limitations Rounded Shoulders     ROM / Strength   AROM / PROM / Strength AROM;Strength     AROM   Overall AROM  Due to pain   Right Shoulder Flexion 124 Degrees   Right Shoulder ABduction 90 Degrees   Right Shoulder External Rotation 70 Degrees   Left Shoulder Flexion 120 Degrees   Left Shoulder ABduction 65 Degrees   Left Shoulder External Rotation 52 Degrees     Strength   Overall Strength Due to pain   Right Shoulder Flexion 3-/5   Right Shoulder ABduction 3-/5   Left Shoulder Flexion 3-/5   Left Shoulder ABduction 3-/5     Palpation   Palpation comment Fullness palpated at right upper arm  No fibrosis felt in breasts at this time, but pt states it is because she has been wearing a sports bra            LYMPHEDEMA/ONCOLOGY QUESTIONNAIRE - 02/12/16 0844      Right Upper  Extremity Lymphedema   10 cm Proximal to Olecranon Process 46 cm   Olecranon Process 31 cm   15 cm Proximal to Ulnar Styloid Process 32.5 cm   Just Proximal to Ulnar Styloid Process 17.5 cm   Across Hand at [employer]  20 cm   At [facility] of 2nd Digit 6.3 cm     Left Upper Extremity Lymphedema   10 cm Proximal to Olecranon Process 44 cm   Olecranon Process 30 cm   15 cm Proximal to Ulnar Styloid Process 30 cm   Just Proximal to Ulnar Styloid Process 17 cm   Across Hand at [employer] 20 cm   At [place] of 2nd Digit 6.3 cm                        PT Education - 02/12/16 1052    Education provided Yes   Education Details Pt given phone numbers to get compression  bra/vest    Person(s) Educated Patient   Methods Explanation;Handout   Comprehension Verbalized understanding                [facility] Goals - 02/12/16 1103      CC Long Term Goal  #1   Title Patient with verbalize an understanding of lymphedema risk reduction precautions   Time 4  after start of treatment    Period Weeks   Status New     CC Long Term Goal  #2   Title Patient will be independent in a basic advanced home exercise program   Time 4  after start of treatment    Period Weeks   Status New     CC Long Term Goal  #3   Title Patient will be know how to obtain and use compression garments for maintenance phase of treatment   Time 4  after start of treatment    Period Weeks   Status New     CC Long Term Goal  #4   Title Pt will improve left active abduction to 90 degrees without pain so that she can get dressed easier.   Time 4  after treatment starts    Period Weeks   Status New            Plan - 02/12/16 1054    Clinical Impression Statement 65 yo female with multiple medical issues affecting mobility requiring SCAT transportation who reports significant weight gain since radiation therapy and limited financial resources comes to PT with swelling and pain in  both breasts along with decreased shoulder range of motion and strength, She has mildly increased circumference of right upper arm  She feels that her symptoms, while fluctuating,  have been progressing and are not managed by what she has tried at home. For these reasons, this eval is of moderate complexity    Rehab Potential Fair   Clinical Impairments Affecting Rehab Potential Previous radiation with reported weight gain during radiation for unknown reason    PT Frequency 2x / week   PT Duration 4 weeks  pt likely not able to attend all visits    PT Treatment/Interventions ADLs/Self Care Home Management;Patient/family education;Manual techniques;Manual lymph drainage;Compression bandaging;Taping;Therapeutic activities;Therapeutic exercise;Passive range of motion;Scar mobilization;DME Instruction   PT Next Visit Plan Intruct in self manual lymph drainage and lymphedemd risk reduction. shoulder ROM HEP community exercise resources    Consulted and Agree with Plan of Care Patient      Patient will benefit from skilled therapeutic intervention in order to improve the following deficits and impairments:  Pain, Impaired UE functional use, Decreased strength, Decreased knowledge of use of DME, Decreased knowledge of precautions, Increased edema, Decreased range of motion, Postural dysfunction, Impaired perceived functional ability  Visit Diagnosis: Disorder  of the skin and subcutaneous tissue related to radiation, unspecified - Plan: PT plan of care cert/re-cert  Lymphedema, not elsewhere classified - Plan: PT plan of care cert/re-cert  Stiffness of right shoulder, not elsewhere classified - Plan: PT plan of care cert/re-cert  Stiffness of left shoulder, not elsewhere classified - Plan: PT plan of care cert/re-cert  Chronic left shoulder pain - Plan: PT plan of care cert/re-cert  Chronic right shoulder pain - Plan: PT plan of care cert/re-cert      G-Codes - 82/51/89 1108    Functional  Assessment Tool Used lymphedema LIfe Impact scale  54 or 79% impaired    Functional Limitation Self care   Self Care Current Status (Q4210) At least 60 percent but less than 80 percent impaired, limited or restricted   Self Care Goal Status (Z1281) At least 40 percent but less than 60 percent impaired, limited or restricted       Problem List Patient Active Problem List   Diagnosis Date Noted  . Genetic testing 01/02/2015  . Family history of breast cancer in female 12/11/2014  . Family history of ovarian cancer 12/11/2014  . Primary cancer of lower-inner quadrant of left female breast ([place]) 12/05/2014  . Back pain 04/11/2014  . MDD (major depressive disorder) 06/24/2013  . GERD (gastroesophageal reflux disease) 06/24/2013  . Bipolar 1 disorder, depressed ([place]) 06/13/2013  . Severe obesity (BMI >= 40) ([place]) 10/11/2012  . Hyperlipidemia 10/11/2012  . Allergic rhinitis 10/11/2012  . Fibromyalgia 10/11/2012  . Sciatica neuralgia 10/11/2012  . Essential hypertension 08/28/2012   Donato Heinz. Owens Shark, PT  [place] 02/12/2016, 11:14 AM  [place], [place], [address] Phone: [phone]   Fax:  [phone]  Name: Caitlyn Bautista MRN: [id number] Date of Birth: [birth year]  PHYSICAL THERAPY DISCHARGE SUMMARY  Visits from Start of Care: 1  Current functional level related to goals / functional outcomes: unnknown as pt did not return for treatment    Remaining deficits: unknonwn   Education / Equipment: As above  Plan: Patient agrees to discharge.  Patient goals were not met. Patient is being discharged due to not returning since the last visit.  ?????    Maudry Diego, PT 08/02/17 9:40 AM

## 2016-02-16 NOTE — Assessment & Plan Note (Deleted)
left lumpectomy 01/28/2015: DCIS 1.8 cm, margins negative, closest margin 0.3 cm, ALH,fibrocystic changes with calcifications, Tis N0 stage 0, ER 100%, PR 100% low to intermediate grade Completed adjuvant radiation therapy 04/08/2015 to 05/18/2015 (Dr.Squire)  Current Treatment: Antiestrogen therapy with tamoxifen 5 years started 07/14/15  Tamoxifen Toxicities:  RTC in 6 months

## 2016-02-17 ENCOUNTER — Ambulatory Visit: Payer: Self-pay | Admitting: Hematology and Oncology

## 2016-02-17 ENCOUNTER — Ambulatory Visit: Payer: Self-pay | Admitting: Internal Medicine

## 2016-02-22 ENCOUNTER — Ambulatory Visit: Payer: Medicare Other | Admitting: Internal Medicine

## 2016-02-22 DIAGNOSIS — Z0289 Encounter for other administrative examinations: Secondary | ICD-10-CM

## 2016-03-15 ENCOUNTER — Ambulatory Visit: Payer: Medicare Other | Admitting: Physical Therapy

## 2016-03-17 ENCOUNTER — Encounter: Payer: Self-pay | Admitting: Physical Therapy

## 2016-03-22 ENCOUNTER — Encounter: Payer: Self-pay | Admitting: Physical Therapy

## 2016-03-24 ENCOUNTER — Encounter: Payer: Self-pay | Admitting: Physical Therapy

## 2016-03-29 ENCOUNTER — Encounter: Payer: Self-pay | Admitting: Physical Therapy

## 2016-03-31 ENCOUNTER — Encounter: Payer: Self-pay | Admitting: Physical Therapy

## 2016-04-06 ENCOUNTER — Ambulatory Visit: Payer: Medicare Other | Attending: Plastic Surgery | Admitting: Physical Therapy

## 2016-04-06 ENCOUNTER — Telehealth: Payer: Self-pay | Admitting: Emergency Medicine

## 2016-04-06 NOTE — Telephone Encounter (Signed)
Patient called to notify Dr Lindi Adie that she has been unable to keep her appointments due to financial concerns. Will contact social work to see if we have any resources that can assist her.   Spoke with Abby Potash with social work; she will follow up with patient.

## 2016-04-07 ENCOUNTER — Encounter: Payer: Self-pay | Admitting: *Deleted

## 2016-04-07 NOTE — Progress Notes (Signed)
[place] Work  Clinical Social Work was referred by nurse for assessment of psychosocial needs due to pt not making it to follow up appointments due to financial concerns.  Clinical Social Worker contacted patient at home and attempted to offer support and assess for needs. CSW left supportive message and encouraged pt to return CSW call to explore this concern further. CSW awaits return call.      Loren Racer, [place] Worker [place]  [place] Phone: [phone] Fax: [phone]

## 2016-04-08 ENCOUNTER — Encounter: Payer: Self-pay | Admitting: Physical Therapy

## 2016-04-08 ENCOUNTER — Encounter: Payer: Self-pay | Admitting: *Deleted

## 2016-04-08 NOTE — Progress Notes (Signed)
Caitlyn Bautista  Clinical Social Bautista returned pt's call about financial concerns. Pt has had great hardship covering outstanding medical bills after her cancer treatment. CSW reviewed with pt possible options for assistance and additional resources. For many assistance options, pt is right over the line and does not qualify for much. Pt aware of the importance of regular follow ups, but is trying to get caught up on past bills. CSW provided pt with billing dept info to call and get assistance. Pt interested in support options and CSW mailing information. Pt reports to have 1 refill of her tamoxifen at this time. She hopes to go to her PCP in near future for other refills, but reports to owe them funds as well.  CSW also mailing SCAT pass to assist in getting to follow up more easily. Pt plans to call Cancer Care and reach back out to CSW as needed.   Clinical Social Bautista interventions:  Resource assistance Supportive listening  Caitlyn Bautista, [place] Worker [place]  [place] Phone: [phone] Fax: [phone]

## 2016-04-20 ENCOUNTER — Encounter (HOSPITAL_COMMUNITY): Payer: Self-pay

## 2016-05-05 ENCOUNTER — Encounter: Payer: Self-pay | Admitting: *Deleted

## 2016-05-05 NOTE — Progress Notes (Signed)
[place] Work  Clinical Social Work was referred by patient for assessment of psychosocial needs, completion of Cancer Care application.  Clinical Social Worker spoke with pt via phone and email and submitted Cancer Care application on behalf of pt. Pt disclosed she has requested copies of her itemized bills and plans to reach out to hospital system for financial assistance for outstanding medical bills.   Clinical Social Work interventions: Resource assistance  Loren Racer, [employer], OSW-C Clinical Social Worker [place]  [place] Phone: [phone] Fax: [phone]

## 2016-05-17 ENCOUNTER — Other Ambulatory Visit: Payer: Self-pay | Admitting: Internal Medicine

## 2016-05-17 NOTE — Telephone Encounter (Signed)
Patient contacted and stated awareness that she needs a yearly visit in order to get refills on medications

## 2016-06-23 ENCOUNTER — Ambulatory Visit (INDEPENDENT_AMBULATORY_CARE_PROVIDER_SITE_OTHER): Payer: Medicare Other | Admitting: Internal Medicine

## 2016-06-23 ENCOUNTER — Encounter: Payer: Self-pay | Admitting: Internal Medicine

## 2016-06-23 ENCOUNTER — Other Ambulatory Visit (INDEPENDENT_AMBULATORY_CARE_PROVIDER_SITE_OTHER): Payer: Medicare Other

## 2016-06-23 VITALS — BP 160/96 | HR 123 | Temp 98.5°F | Resp 16 | Ht 67.0 in | Wt 304.0 lb

## 2016-06-23 DIAGNOSIS — E785 Hyperlipidemia, unspecified: Secondary | ICD-10-CM

## 2016-06-23 DIAGNOSIS — I10 Essential (primary) hypertension: Secondary | ICD-10-CM

## 2016-06-23 DIAGNOSIS — J4520 Mild intermittent asthma, uncomplicated: Secondary | ICD-10-CM

## 2016-06-23 DIAGNOSIS — F319 Bipolar disorder, unspecified: Secondary | ICD-10-CM | POA: Diagnosis not present

## 2016-06-23 DIAGNOSIS — J301 Allergic rhinitis due to pollen: Secondary | ICD-10-CM

## 2016-06-23 LAB — COMPREHENSIVE METABOLIC PANEL
ALBUMIN: 3.7 g/dL (ref 3.5–5.2)
ALT: 13 U/L (ref 0–35)
AST: 12 U/L (ref 0–37)
Alkaline Phosphatase: 96 U/L (ref 39–117)
BUN: 13 mg/dL (ref 6–23)
CHLORIDE: 108 meq/L (ref 96–112)
CO2: 25 meq/L (ref 19–32)
CREATININE: 0.77 mg/dL (ref 0.40–1.20)
Calcium: 8.9 mg/dL (ref 8.4–10.5)
GFR: 97.22 mL/min (ref >60.00)
GLUCOSE: 105 mg/dL — AB (ref 70–99)
Potassium: 3.2 mEq/L — ABNORMAL LOW (ref 3.5–5.1)
SODIUM: 142 meq/L (ref 135–145)
Total Bilirubin: 0.3 mg/dL (ref 0.2–1.2)
Total Protein: 7.7 g/dL (ref 6.0–8.3)

## 2016-06-23 LAB — LIPID PANEL
CHOLESTEROL: 204 mg/dL — AB (ref 0–200)
HDL: 41.9 mg/dL (ref >39.00)
LDL CALC: 136 mg/dL — AB (ref 0–99)
NonHDL: 162.16
Total CHOL/HDL Ratio: 5
Triglycerides: 131 mg/dL (ref 0.0–149.0)
VLDL: 26.2 mg/dL (ref 0.0–40.0)

## 2016-06-23 LAB — CBC
HEMATOCRIT: 34.3 % — AB (ref 36.0–46.0)
Hemoglobin: 10.9 g/dL — ABNORMAL LOW (ref 12.0–15.0)
MCHC: 31.7 g/dL (ref 30.0–36.0)
MCV: 84.1 fl (ref 78.0–100.0)
Platelets: 393 10*3/uL (ref 150.0–400.0)
RBC: 4.08 Mil/uL (ref 3.87–5.11)
RDW: 16.9 % — ABNORMAL HIGH (ref 11.5–15.5)
WBC: 9 10*3/uL (ref 4.0–10.5)

## 2016-06-23 LAB — HEMOGLOBIN A1C: Hgb A1c MFr Bld: 5.7 % (ref 4.6–6.5)

## 2016-06-23 MED ORDER — MONTELUKAST SODIUM 10 MG PO TABS: 10.0000 mg | ORAL_TABLET | Freq: Every day | ORAL | 3 refills | Status: DC

## 2016-06-23 MED ORDER — LITHIUM CARBONATE 300 MG PO CAPS: 300.0000 mg | ORAL_CAPSULE | Freq: Two times a day (BID) | ORAL | 0 refills | Status: DC

## 2016-06-23 MED ORDER — AMLODIPINE BESYLATE 10 MG PO TABS: 10.0000 mg | ORAL_TABLET | Freq: Every day | ORAL | 3 refills | Status: DC

## 2016-06-23 MED ORDER — ALBUTEROL SULFATE HFA 108 (90 BASE) MCG/ACT IN AERS: 2.0000 | INHALATION_SPRAY | Freq: Every day | RESPIRATORY_TRACT | 0 refills | Status: DC

## 2016-06-23 MED ORDER — DULOXETINE HCL 60 MG PO CPEP: 60.0000 mg | ORAL_CAPSULE | Freq: Two times a day (BID) | ORAL | 0 refills | Status: DC

## 2016-06-23 MED ORDER — FLUTICASONE FUROATE-VILANTEROL 100-25 MCG/INH IN AEPB: 1.0000 | INHALATION_SPRAY | Freq: Every day | RESPIRATORY_TRACT | 2 refills | Status: DC

## 2016-06-23 MED ORDER — FLUTICASONE PROPIONATE 50 MCG/ACT NA SUSP: 2.0000 | Freq: Every day | NASAL | 6 refills | Status: DC

## 2016-06-23 MED ORDER — BUPROPION HCL ER (XL) 150 MG PO TB24: 150.0000 mg | ORAL_TABLET | Freq: Every day | ORAL | 0 refills | Status: DC

## 2016-06-23 NOTE — Patient Instructions (Addendum)
We are checking the labs today and will call you back with the results.  We have sent in a month's worth of refills of the medications so you can get back in with your psychiatrist.   We have sent in singulair for the allergies to replace the allegra. We have sent in a nose spray which should be covered instead of the one over the counter.   We have sent in the albuterol inhaler as well as the daily medicine called breo to use (take 1 puff daily to help keep the lungs clear).   We would like to see you back in about 2 months to check on the blood pressure.

## 2016-06-23 NOTE — Progress Notes (Signed)
   Subjective:    Patient ID: Caitlyn Bautista, female    DOB: [birth year], 66 y.o.   MRN: [id number]  HPI The patient is a 66 YO female coming in for several concerns including her allergies (she is having some financial problems and cannot afford otc allergy medications, she is not able to breathe well without allergy medication, wants to know if there is something still prescription that she can try), and her mental health (she does have bipolar disorder and typically sees psych, she has not been able to afford the co-pay to go see them for some time and they are not able to give her medications without seeing her, she is wondering if we can give her a short supply until she can afford to go see them), and her breathing (she is doing worse with breathing, using albuterol more often, problems with SOB on exertion of even short distances, previously had controlled medicine but has not in some years and feels like she needs it again, no prednisone since last visit, this is severely limiting her QOL and ADLs). She also is having some other new problems associated with her breast cancer (newly diagnosed and treated since her last visit with [place] 1-2 years ago).   PMH, [facility], social history reviewed and updated.   Review of Systems  Constitutional: Positive for activity change. Negative for appetite change, chills, fatigue, fever and unexpected weight change.  HENT: Positive for congestion, postnasal drip and rhinorrhea. Negative for sinus pain, sinus pressure, sore throat and trouble swallowing.   Eyes: Negative.   Respiratory: Positive for shortness of breath and wheezing. Negative for cough and chest tightness.   Cardiovascular: Negative for chest pain, palpitations and leg swelling.  Gastrointestinal: Negative.   Musculoskeletal: Positive for arthralgias and myalgias. Negative for back pain and gait problem.  Skin: Negative.   Neurological: Negative.   Psychiatric/Behavioral: Negative for confusion,  decreased concentration, dysphoric mood, self-injury, sleep disturbance and suicidal ideas.       Well controlled currently.       Objective:   Physical Exam  Constitutional: She is oriented to person, place, and time. She appears well-developed and well-nourished.  Obese  HENT:  Head: Normocephalic and atraumatic.  Oropharynx with redness and clear drainage, nose without crusting, no sinus pressure.   Eyes: EOM are normal.  Neck: Normal range of motion. No JVD present.  Cardiovascular: Normal rate and regular rhythm.   Abdominal: Soft.  Musculoskeletal: She exhibits no edema.  Lymphadenopathy:    She has no cervical adenopathy.  Neurological: She is alert and oriented to person, place, and time.  Skin: Skin is warm and dry.  Psychiatric: She has a normal mood and affect.   Vitals:   06/23/16 0936  BP: (!) 160/96  Pulse: (!) 123  Resp: 16  Temp: 98.5 F (36.9 C)  TempSrc: Oral  SpO2: 98%  Weight: (!) 304 lb (137.9 kg)  Height: 5\' 7"  (1.702 m)      Assessment & Plan:

## 2016-06-23 NOTE — Progress Notes (Signed)
Pre visit review using our clinic review tool, if applicable. No additional management support is needed unless otherwise documented below in the visit note. 

## 2016-06-24 ENCOUNTER — Encounter: Payer: Self-pay | Admitting: Internal Medicine

## 2016-06-24 DIAGNOSIS — J45909 Unspecified asthma, uncomplicated: Secondary | ICD-10-CM | POA: Insufficient documentation

## 2016-06-24 NOTE — Assessment & Plan Note (Signed)
She insists that she is stable at this time. She does not want to stop meds abruptly. Will give 30 day supply of meds only and she needs to see psych before that time is up. If she cannot afford her current psych [facility] which is walk in and free even if she cannot pay. Refill 1 month of cymbalta, lithium, wellbutrin.

## 2016-06-24 NOTE — Assessment & Plan Note (Signed)
Rx for singulair since she cannot afford OTC medications. Also rx for flonase to see if insurance will cover.

## 2016-06-24 NOTE — Assessment & Plan Note (Signed)
Rx for breo and albuterol to help with severe SOB symptoms. Suspect some OHS which is contributing.

## 2016-06-24 NOTE — Assessment & Plan Note (Signed)
She is limited by finances as well as her lithium therapy. She is not supposed to take diuretics, ACE-I/ARB due to concurrent lithium therapy. She declined new medications today and wants to monitor BP. Asked her to return soon for follow up.

## 2016-08-09 ENCOUNTER — Encounter: Payer: Self-pay | Admitting: Internal Medicine

## 2016-08-18 ENCOUNTER — Ambulatory Visit: Payer: Self-pay | Admitting: Internal Medicine

## 2016-08-25 ENCOUNTER — Ambulatory Visit: Payer: Self-pay | Admitting: Internal Medicine

## 2016-08-30 ENCOUNTER — Telehealth: Payer: Self-pay | Admitting: Internal Medicine

## 2016-08-30 ENCOUNTER — Other Ambulatory Visit: Payer: Self-pay | Admitting: Hematology and Oncology

## 2016-08-30 DIAGNOSIS — C50312 Malignant neoplasm of lower-inner quadrant of left female breast: Secondary | ICD-10-CM

## 2016-08-30 NOTE — Telephone Encounter (Signed)
Spoke with pt and states that she needs refill for her tamoxifen. Pt doing well with this medication. Pt is due for her annual and needs to schedule an appt. Pt experiencing some financial difficulty at this time and is on a fixed income. Pt won't be able to come in until August. Scheduled pt for 2nd week of august to see Dr.Gudena and refilled her tamoxifen. Pt very grateful and has no further concerns or questions at this time.

## 2016-08-30 NOTE — Telephone Encounter (Signed)
Per Dr. Sharlet Salina if she is unable to afford her medications it is recommended she get these medications filled at [facility]. She was given 30 day supply to do so in April. C

## 2016-08-30 NOTE — Telephone Encounter (Signed)
Would like a refill of  lithium carbonate 300 MG capsule-wants slow acting-slow release, states the capsules tear up her stomach, she states she cannot afford to go to the psychiatrists to get this.  Also needs a refill of  diazepam (VALIUM) 5 MG tablet  DULoxetine (CYMBALTA) 60 MG capsule  buPROPion (WELLBUTRIN XL) 150 MG 24 hr tablet    [facility] on [address]

## 2016-08-30 NOTE — Telephone Encounter (Signed)
Please advise. Valium not even prescribed by Sharlet Salina and the other 3 were given as a 1 month supply to give her time to go see psych as stated in note from last visit and that was back in April

## 2016-09-04 ENCOUNTER — Other Ambulatory Visit: Payer: Self-pay | Admitting: Internal Medicine

## 2016-09-04 DIAGNOSIS — J301 Allergic rhinitis due to pollen: Secondary | ICD-10-CM

## 2016-09-05 NOTE — Telephone Encounter (Signed)
Please advise 

## 2016-09-05 NOTE — Telephone Encounter (Signed)
Per Dr. Nathanial Millman note it needs to come from psychiatry.

## 2016-09-05 NOTE — Telephone Encounter (Signed)
Pt called and said that her psychiatrist told her that her pcp could send in these medications. Pt said that she has never been to [facility] and that it is not that she can not afford the medication, she can not afford the copay to see the psychiatrist. He told her that low income should never be a reason that she stop taking her medications so Dr Sharlet Salina could sent them. She does have assistance with medication coverage.   She tried to call the psychiatrist and left a message with the manager to call her back regarding these medication refills.

## 2016-09-09 ENCOUNTER — Ambulatory Visit (INDEPENDENT_AMBULATORY_CARE_PROVIDER_SITE_OTHER): Payer: Medicare Other | Admitting: Nurse Practitioner

## 2016-09-09 ENCOUNTER — Encounter: Payer: Self-pay | Admitting: Nurse Practitioner

## 2016-09-09 VITALS — BP 142/88 | HR 90 | Temp 98.6°F | Ht 67.0 in | Wt 303.0 lb

## 2016-09-09 DIAGNOSIS — I10 Essential (primary) hypertension: Secondary | ICD-10-CM | POA: Diagnosis not present

## 2016-09-09 DIAGNOSIS — W19XXXA Unspecified fall, initial encounter: Secondary | ICD-10-CM | POA: Diagnosis not present

## 2016-09-09 DIAGNOSIS — F339 Major depressive disorder, recurrent, unspecified: Secondary | ICD-10-CM

## 2016-09-09 DIAGNOSIS — M5430 Sciatica, unspecified side: Secondary | ICD-10-CM | POA: Diagnosis not present

## 2016-09-09 DIAGNOSIS — J4520 Mild intermittent asthma, uncomplicated: Secondary | ICD-10-CM

## 2016-09-09 DIAGNOSIS — F319 Bipolar disorder, unspecified: Secondary | ICD-10-CM | POA: Diagnosis not present

## 2016-09-09 DIAGNOSIS — R2681 Unsteadiness on feet: Secondary | ICD-10-CM

## 2016-09-09 MED ORDER — BUPROPION HCL ER (XL) 150 MG PO TB24: 150.0000 mg | ORAL_TABLET | Freq: Every day | ORAL | 0 refills | Status: DC

## 2016-09-09 MED ORDER — LITHIUM CARBONATE 300 MG PO CAPS: 300.0000 mg | ORAL_CAPSULE | Freq: Two times a day (BID) | ORAL | 0 refills | Status: DC

## 2016-09-09 MED ORDER — DULOXETINE HCL 30 MG PO CPEP: 30.0000 mg | ORAL_CAPSULE | Freq: Two times a day (BID) | ORAL | 0 refills | Status: DC

## 2016-09-09 MED ORDER — MONTELUKAST SODIUM 10 MG PO TABS: 10.0000 mg | ORAL_TABLET | Freq: Every day | ORAL | 0 refills | Status: DC

## 2016-09-09 MED ORDER — AMLODIPINE BESYLATE 10 MG PO TABS: 10.0000 mg | ORAL_TABLET | Freq: Every day | ORAL | 0 refills | Status: DC

## 2016-09-09 NOTE — Patient Instructions (Signed)
Housing letter provided to patient in office.

## 2016-09-09 NOTE — Progress Notes (Signed)
Subjective:  Patient ID: Caitlyn Bautista, female    DOB: 11-Apr-1950  Age: 66 y.o. MRN: [id number]  CC: Follow-up (medications consult--pt is on fix income--cant go to psychiatry due to money/ Hep C test?/ nurse aid/nurse consult due to fall?)   HPI Bipolar disorder: unable to afford psychiatry visits. Need medications refilled. Reports her mood has been stable. No SI or HI. Denies any substance use.  Unsteady Gait: Reports frequent falls due to chronic LE weakness and chronic back pain.  Last fall 1week ago. Lives in section 8 housing. Needs letter to allow family member to lives with her for extend periods of time to help with ADLs.  Outpatient Medications Prior to Visit  Medication Sig Dispense Refill  . diazepam (VALIUM) 5 MG tablet Take 1 tablet (5 mg total) by mouth 3 (three) times daily as needed for anxiety. 30 tablet 0  . fexofenadine (ALLEGRA) 180 MG tablet Take 1 tablet (180 mg total) by mouth daily. For seasonal allergies.    . fluticasone (FLONASE) 50 MCG/ACT nasal spray Place 2 sprays into both nostrils daily. For seasonal allergies. 16 g 6  . fluticasone furoate-vilanterol (BREO ELLIPTA) 100-25 MCG/INH AEPB Inhale 1 puff into the lungs daily. 1 each 2  . Ginger, Zingiber officinalis, (GINGER PO) Take by mouth.    . mineral oil liquid Take 15 mLs by mouth daily as needed for moderate constipation.    . non-metallic deodorant Jethro Poling) MISC Apply 1 application topically daily as needed. Reported on 06/19/2015    . PROAIR HFA 108 (90 Base) MCG/ACT inhaler INHALE 2 PUFFS INTO THE LUNGS DAILY FOR WHEEZING 9 each 0  . ranitidine (ZANTAC) 75 MG tablet Take 1 tablet (75 mg total) by mouth 2 (two) times daily.    . tamoxifen (NOLVADEX) 20 MG tablet TAKE ONE TABLET BY MOUTH ONCE DAILY. 90 tablet 0  . Turmeric Curcumin 500 MG CAPS Take by mouth.    Marland Kitchen amLODipine (NORVASC) 10 MG tablet Take 1 tablet (10 mg total) by mouth daily. 30 tablet 3  . buPROPion (WELLBUTRIN XL) 150 MG 24 hr  tablet Take 1 tablet (150 mg total) by mouth daily. 30 tablet 0  . DULoxetine (CYMBALTA) 60 MG capsule TAKE 1 CAPSULE BY MOUTH TWICE DAILY 60 capsule 0  . lithium carbonate 300 MG capsule Take 1 capsule (300 mg total) by mouth 2 (two) times daily with a meal. 60 capsule 0  . montelukast (SINGULAIR) 10 MG tablet Take 1 tablet (10 mg total) by mouth at bedtime. 30 tablet 3  . HYDROcodone-acetaminophen (NORCO) 10-325 MG per tablet Take 1 tablet by mouth every 8 (eight) hours as needed. (Patient not taking: Reported on 09/09/2016) 90 tablet 0   No facility-administered medications prior to visit.     ROS See HPI  Objective:  BP (!) 142/88   Pulse 90   Temp 98.6 F (37 C)   Ht 5\' 7"  (1.702 m)   Wt (!) 303 lb (137.4 kg)   SpO2 98%   BMI 47.46 kg/m   BP Readings from Last 3 Encounters:  09/09/16 (!) 142/88  06/23/16 (!) 160/96  10/22/15 140/84    Wt Readings from Last 3 Encounters:  09/09/16 (!) 303 lb (137.4 kg)  06/23/16 (!) 304 lb (137.9 kg)  10/22/15 297 lb (134.7 kg)    Physical Exam  Constitutional: She is oriented to person, place, and time. No distress.  Cardiovascular: Normal rate.   Pulmonary/Chest: Effort normal. No respiratory distress.  Musculoskeletal: She exhibits  tenderness. She exhibits no edema.  Neurological: She is alert and oriented to person, place, and time.  Ambulates with walker  Skin: Skin is warm and dry.  Psychiatric: She has a normal mood and affect. Her behavior is normal.  Vitals reviewed.   Lab Results  Component Value Date   WBC 9.0 06/23/2016   HGB 10.9 (L) 06/23/2016   HCT 34.3 (L) 06/23/2016   PLT 393.0 06/23/2016   GLUCOSE 105 (H) 06/23/2016   CHOL 204 (H) 06/23/2016   TRIG 131.0 06/23/2016   HDL 41.90 06/23/2016   LDLCALC 136 (H) 06/23/2016   ALT 13 06/23/2016   AST 12 06/23/2016   NA 142 06/23/2016   K 3.2 (L) 06/23/2016   CL 108 06/23/2016   CREATININE 0.77 06/23/2016   BUN 13 06/23/2016   CO2 25 06/23/2016   TSH 3.61  10/11/2012   HGBA1C 5.7 06/23/2016    No results found.  Assessment & Plan:   Caitlyn Bautista was seen today for follow-up.  Diagnoses and all orders for this visit:  Bipolar 1 disorder, depressed ([place]) -     buPROPion (WELLBUTRIN XL) 150 MG 24 hr tablet; Take 1 tablet (150 mg total) by mouth daily. -     lithium carbonate 300 MG capsule; Take 1 capsule (300 mg total) by mouth 2 (two) times daily with a meal.  Essential hypertension -     amLODipine (NORVASC) 10 MG tablet; Take 1 tablet (10 mg total) by mouth daily.  Episode of recurrent major depressive disorder, unspecified depression episode severity (HCC) -     buPROPion (WELLBUTRIN XL) 150 MG 24 hr tablet; Take 1 tablet (150 mg total) by mouth daily.  Mild intermittent asthma without complication -     montelukast (SINGULAIR) 10 MG tablet; Take 1 tablet (10 mg total) by mouth at bedtime.  Sciatica, unspecified laterality -     DULoxetine (CYMBALTA) 30 MG capsule; Take 1 capsule (30 mg total) by mouth 2 (two) times daily. -     Ambulatory referral to [place] [facility] -     Ambulatory referral to [place], initial encounter -     Ambulatory referral to [place]   I have changed Caitlyn Bautista's DULoxetine. I am also having her maintain her diazepam, fexofenadine, ranitidine, HYDROcodone-acetaminophen, mineral oil, non-metallic deodorant, (Ginger, Zingiber officinalis, (GINGER PO)), Turmeric Curcumin, fluticasone, fluticasone furoate-vilanterol, tamoxifen, PROAIR HFA, buPROPion, montelukast, lithium carbonate, and amLODipine.  Meds ordered this encounter  Medications  . buPROPion (WELLBUTRIN XL) 150 MG 24 hr tablet    Sig: Take 1 tablet (150 mg total) by mouth daily.    Dispense:  90 tablet    Refill:  0    Additional refill from psychiatry.    Order Specific Question:   Supervising Provider    Answer:   Caitlyn Bautista [[id number]]  . DULoxetine (CYMBALTA) 30 MG capsule    Sig: Take 1 capsule (30 mg total) by  mouth 2 (two) times daily.    Dispense:  180 capsule    Refill:  0    Additional refill from psychiatry.    Order Specific Question:   Supervising Provider    Answer:   Caitlyn Bautista [[id number]]  . montelukast (SINGULAIR) 10 MG tablet    Sig: Take 1 tablet (10 mg total) by mouth at bedtime.    Dispense:  90 tablet    Refill:  0    Additional refill from psychiatry.    Order Specific  Question:   Supervising Provider    Answer:   Caitlyn Bautista [[id number]]  . lithium carbonate 300 MG capsule    Sig: Take 1 capsule (300 mg total) by mouth 2 (two) times daily with a meal.    Dispense:  180 capsule    Refill:  0    Additional refill from psychiatry    Order Specific Question:   Supervising Provider    Answer:   Caitlyn Bautista [[id number]]  . amLODipine (NORVASC) 10 MG tablet    Sig: Take 1 tablet (10 mg total) by mouth daily.    Dispense:  90 tablet    Refill:  0    Need office visit for additional refill    Order Specific Question:   Supervising Provider    Answer:   Caitlyn Bautista [[id number]]    Follow-up: Return in about 3 months (around 12/10/2016) for with [place].  Wilfred Lacy, NP

## 2016-09-16 NOTE — Progress Notes (Deleted)
Subjective:   Caitlyn Bautista is a 66 y.o. female who presents for Medicare Annual (Subsequent) preventive examination.  Review of Systems:  No ROS.  Medicare Wellness Visit. Additional risk factors are reflected in the social history.    Sleep patterns: {SX; SLEEP PATTERNS:18802::"feels rested on waking","does not get up to void","gets up *** times nightly to void","sleeps *** hours nightly"}.   Home Safety/Smoke Alarms: Feels safe in home. Smoke alarms in place.  Living environment; residence and Firearm Safety: {Rehab home environment / accessibility:30080::"no firearms","firearms stored safely"}. Seat Belt Safety/Bike Helmet: Wears seat belt.   Counseling:   Eye Exam-  Dental-  Female:   Pap- 03/08/11, Hysterectomy, N/A      Mammo- Last 12/02/14     Dexa scan- N/D      CCS- Last 03/08/11, recall 10 years     Objective:     Vitals: There were no vitals taken for this visit.  There is no height or weight on file to calculate BMI.   Tobacco History  Smoking Status  . Never Smoker  Smokeless Tobacco  . Never Used     Counseling given: Not Answered   Past Medical History:  Diagnosis Date  . Allergy   . Anemia   . Anxiety   . Arachnoiditis   . Arthritis   . Asthma   . Bipolar disorder ([place])   . Cancer of lower-inner quadrant of female breast ([place]) 12/05/2014  . Carpal tunnel syndrome   . Carpal tunnel syndrome, bilateral   . Cervical herniated disc   . Depression    bipolar depression  . Fibromyalgia   . GERD (gastroesophageal reflux disease)   . History of bronchitis   . History of chicken pox   . History of pneumonia 1996  . Hyperlipidemia    pt. denies  . Hypertension   . Incontinence of urine in female   . PTSD (post-traumatic stress disorder)   . Scoliosis   . Sleep apnea    does not use her CPAP  . Thyroid disease    "very mild"  . Urinary frequency   . Urinary urgency    Past Surgical History:  Procedure Laterality Date  . ABDOMINAL  HYSTERECTOMY  1990's  . BREAST LUMPECTOMY  1970's   right  . BREAST LUMPECTOMY WITH NEEDLE LOCALIZATION Left 01/28/2015   Procedure: LEFT BREAST LUMPECTOMY WITH NEEDLE LOCALIZATION;  Surgeon: [name], MD;  Location: [place];  Service: General;  Laterality: Left;  . BREAST REDUCTION SURGERY Left 01/28/2015   Procedure: BREAST REDUCTION;  Surgeon: Loel Lofty Dillingham, DO;  Location: [place];  Service: Plastics;  Laterality: Left;  . BREAST REDUCTION SURGERY Right 10/22/2015   Procedure: RIGHT MAMMARY REDUCTION  (BREAST) FOR SYMMETRY;  Surgeon: Wallace Going, DO;  Location: [place];  Service: Plastics;  Laterality: Right;  . COLONOSCOPY    . DILATION AND CURETTAGE OF UTERUS    . TUBAL LIGATION     Family History  Problem Relation Age of Onset  . Cancer Mother        unspecified type; dx. 68 or younger  . Ovarian cancer Sister 62  . Breast cancer Maternal Aunt        bilateral - dx. 30s, 18s  . Ovarian cancer Maternal Grandmother 100  . Ovarian cancer Sister 80  . Cervical cancer Sister 71  . Pancreatic cancer Maternal Uncle        dx. 50s-60s  . Cancer Cousin  either ovarian or cervical cancer  . Cancer Daughter        hx of TAH-BSO due to fibroids and bleeding  . Arthritis Other        Parent  . Hypertension Other        Parent  . Arthritis Other        Grandparent  . Hypertension Other        Grandparent  . Arthritis Other        Other Blood Relative  . Hypertension Other        Other Blood Relative  . Diabetes Other        Other Blood Relative   History  Sexual Activity  . Sexual activity: No    Outpatient Encounter Prescriptions as of 09/19/2016  Medication Sig  . amLODipine (NORVASC) 10 MG tablet Take 1 tablet (10 mg total) by mouth daily.  Marland Kitchen buPROPion (WELLBUTRIN XL) 150 MG 24 hr tablet Take 1 tablet (150 mg total) by mouth daily.  . diazepam (VALIUM) 5 MG tablet Take 1 tablet (5 mg total) by mouth 3 (three) times daily as needed  for anxiety.  . DULoxetine (CYMBALTA) 30 MG capsule Take 1 capsule (30 mg total) by mouth 2 (two) times daily.  . fexofenadine (ALLEGRA) 180 MG tablet Take 1 tablet (180 mg total) by mouth daily. For seasonal allergies.  . fluticasone (FLONASE) 50 MCG/ACT nasal spray Place 2 sprays into both nostrils daily. For seasonal allergies.  . fluticasone furoate-vilanterol (BREO ELLIPTA) 100-25 MCG/INH AEPB Inhale 1 puff into the lungs daily.  . Ginger, Zingiber officinalis, (GINGER PO) Take by mouth.  Marland Kitchen HYDROcodone-acetaminophen (NORCO) 10-325 MG per tablet Take 1 tablet by mouth every 8 (eight) hours as needed. (Patient not taking: Reported on 09/09/2016)  . lithium carbonate 300 MG capsule Take 1 capsule (300 mg total) by mouth 2 (two) times daily with a meal.  . mineral oil liquid Take 15 mLs by mouth daily as needed for moderate constipation.  . montelukast (SINGULAIR) 10 MG tablet Take 1 tablet (10 mg total) by mouth at bedtime.  . non-metallic deodorant Jethro Poling) MISC Apply 1 application topically daily as needed. Reported on 06/19/2015  . PROAIR HFA 108 (90 Base) MCG/ACT inhaler INHALE 2 PUFFS INTO THE LUNGS DAILY FOR WHEEZING  . ranitidine (ZANTAC) 75 MG tablet Take 1 tablet (75 mg total) by mouth 2 (two) times daily.  . tamoxifen (NOLVADEX) 20 MG tablet TAKE ONE TABLET BY MOUTH ONCE DAILY.  . Turmeric Curcumin 500 MG CAPS Take by mouth.   No facility-administered encounter medications on file as of 09/19/2016.     Activities of Daily Living In your present state of health, do you have any difficulty performing the following activities: 10/22/2015  Hearing? N  Vision? N  Difficulty concentrating or making decisions? N  Walking or climbing stairs? Y  Dressing or bathing? N  Some recent data might be hidden    Patient Care Team: Hoyt Koch, MD as PCP - General (Internal Medicine) Jovita Kussmaul, MD as Consulting Physician (General Surgery) Nicholas Lose, MD as Consulting Physician  (Hematology and Oncology) Arloa Koh, MD as Consulting Physician (Radiation Oncology) Mauro Kaufmann, RN as Registered Nurse Rockwell Germany, RN as Registered Nurse Jake Shark Johny Blamer, NP as Nurse Practitioner (Nurse Practitioner)    Assessment:    Physical assessment deferred to PCP.  Exercise Activities and Dietary recommendations   Diet (meal preparation, eat out, water intake, caffeinated beverages, dairy products, fruits and  vegetables): {Desc; diets:16563} Breakfast: Lunch:  Dinner:      Goals      Patient Stated   . less pain (pt-stated)          Less pain would assist her with her social and physical goals      Fall Risk Fall Risk  06/19/2015 02/11/2015 09/16/2014 10/11/2012  Falls in the past year? No No No No   Depression Screen PHQ 2/9 Scores 06/19/2015 06/19/2015 02/11/2015 09/16/2014  PHQ - 2 Score 4 4 1 2   PHQ- 9 Score 14 - - 12     Cognitive Function        Immunization History  Administered Date(s) Administered  . Tdap 08/06/2007   Screening Tests Health Maintenance  Topic Date Due  . Hepatitis C Screening  [birth year]  . HIV Screening  12/28/1967  . PAP SMEAR  03/07/2016  . INFLUENZA VACCINE  10/05/2016  . MAMMOGRAM  12/01/2016  . TETANUS/TDAP  08/05/2017  . COLONOSCOPY  03/07/2021      Plan:     I have personally reviewed and noted the following in the patient's chart:   . Medical and social history . Use of alcohol, tobacco or illicit drugs  . Current medications and supplements . Functional ability and status . Nutritional status . Physical activity . Advanced directives . List of other physicians . Vitals . Screenings to include cognitive, depression, and falls . Referrals and appointments  In addition, I have reviewed and discussed with patient certain preventive protocols, quality metrics, and best practice recommendations. A written personalized care plan for preventive services as well as general preventive health  recommendations were provided to patient.     Michiel Cowboy, RN  09/16/2016

## 2016-09-16 NOTE — Progress Notes (Deleted)
Pre visit review using our clinic review tool, if applicable. No additional management support is needed unless otherwise documented below in the visit note. 

## 2016-09-19 ENCOUNTER — Ambulatory Visit: Payer: Self-pay

## 2016-09-30 DIAGNOSIS — M159 Polyosteoarthritis, unspecified: Secondary | ICD-10-CM | POA: Diagnosis not present

## 2016-09-30 DIAGNOSIS — M5430 Sciatica, unspecified side: Secondary | ICD-10-CM | POA: Diagnosis not present

## 2016-09-30 DIAGNOSIS — Z9181 History of falling: Secondary | ICD-10-CM | POA: Diagnosis not present

## 2016-09-30 DIAGNOSIS — K219 Gastro-esophageal reflux disease without esophagitis: Secondary | ICD-10-CM | POA: Diagnosis not present

## 2016-09-30 DIAGNOSIS — Z7951 Long term (current) use of inhaled steroids: Secondary | ICD-10-CM | POA: Diagnosis not present

## 2016-09-30 DIAGNOSIS — G8929 Other chronic pain: Secondary | ICD-10-CM | POA: Diagnosis not present

## 2016-09-30 DIAGNOSIS — M797 Fibromyalgia: Secondary | ICD-10-CM | POA: Diagnosis not present

## 2016-09-30 DIAGNOSIS — E785 Hyperlipidemia, unspecified: Secondary | ICD-10-CM | POA: Diagnosis not present

## 2016-09-30 DIAGNOSIS — J4520 Mild intermittent asthma, uncomplicated: Secondary | ICD-10-CM | POA: Diagnosis not present

## 2016-09-30 DIAGNOSIS — M79652 Pain in left thigh: Secondary | ICD-10-CM | POA: Diagnosis not present

## 2016-09-30 DIAGNOSIS — M79651 Pain in right thigh: Secondary | ICD-10-CM | POA: Diagnosis not present

## 2016-09-30 DIAGNOSIS — I10 Essential (primary) hypertension: Secondary | ICD-10-CM | POA: Diagnosis not present

## 2016-10-03 ENCOUNTER — Telehealth: Payer: Self-pay | Admitting: Internal Medicine

## 2016-10-03 NOTE — Telephone Encounter (Signed)
Called [facility] gave verbal.../lmb

## 2016-10-03 NOTE — Telephone Encounter (Signed)
Pt saw Baldo Ash on 09/09/16 will forward to her since MD is out of office.  Pls advise in MD absence...Johny Chess

## 2016-10-03 NOTE — Telephone Encounter (Signed)
[employer] called requesting verbals for PT 1week1 and 2week 4  Would also like an ok for OT eval and Medical social worker eval

## 2016-10-03 NOTE — Telephone Encounter (Signed)
[place] for PT, OT and medical social work

## 2016-10-05 DIAGNOSIS — M159 Polyosteoarthritis, unspecified: Secondary | ICD-10-CM | POA: Diagnosis not present

## 2016-10-05 DIAGNOSIS — M5430 Sciatica, unspecified side: Secondary | ICD-10-CM | POA: Diagnosis not present

## 2016-10-05 DIAGNOSIS — Z9181 History of falling: Secondary | ICD-10-CM | POA: Diagnosis not present

## 2016-10-05 DIAGNOSIS — G8929 Other chronic pain: Secondary | ICD-10-CM | POA: Diagnosis not present

## 2016-10-05 DIAGNOSIS — Z7951 Long term (current) use of inhaled steroids: Secondary | ICD-10-CM | POA: Diagnosis not present

## 2016-10-05 DIAGNOSIS — K219 Gastro-esophageal reflux disease without esophagitis: Secondary | ICD-10-CM | POA: Diagnosis not present

## 2016-10-05 DIAGNOSIS — M797 Fibromyalgia: Secondary | ICD-10-CM | POA: Diagnosis not present

## 2016-10-05 DIAGNOSIS — E785 Hyperlipidemia, unspecified: Secondary | ICD-10-CM | POA: Diagnosis not present

## 2016-10-05 DIAGNOSIS — I10 Essential (primary) hypertension: Secondary | ICD-10-CM | POA: Diagnosis not present

## 2016-10-05 DIAGNOSIS — M79651 Pain in right thigh: Secondary | ICD-10-CM | POA: Diagnosis not present

## 2016-10-05 DIAGNOSIS — J4520 Mild intermittent asthma, uncomplicated: Secondary | ICD-10-CM | POA: Diagnosis not present

## 2016-10-05 DIAGNOSIS — M79652 Pain in left thigh: Secondary | ICD-10-CM | POA: Diagnosis not present

## 2016-10-07 DIAGNOSIS — I10 Essential (primary) hypertension: Secondary | ICD-10-CM | POA: Diagnosis not present

## 2016-10-07 DIAGNOSIS — G8929 Other chronic pain: Secondary | ICD-10-CM | POA: Diagnosis not present

## 2016-10-07 DIAGNOSIS — M5430 Sciatica, unspecified side: Secondary | ICD-10-CM | POA: Diagnosis not present

## 2016-10-07 DIAGNOSIS — K219 Gastro-esophageal reflux disease without esophagitis: Secondary | ICD-10-CM | POA: Diagnosis not present

## 2016-10-07 DIAGNOSIS — Z9181 History of falling: Secondary | ICD-10-CM | POA: Diagnosis not present

## 2016-10-07 DIAGNOSIS — E785 Hyperlipidemia, unspecified: Secondary | ICD-10-CM | POA: Diagnosis not present

## 2016-10-07 DIAGNOSIS — M79652 Pain in left thigh: Secondary | ICD-10-CM | POA: Diagnosis not present

## 2016-10-07 DIAGNOSIS — Z7951 Long term (current) use of inhaled steroids: Secondary | ICD-10-CM | POA: Diagnosis not present

## 2016-10-07 DIAGNOSIS — M797 Fibromyalgia: Secondary | ICD-10-CM | POA: Diagnosis not present

## 2016-10-07 DIAGNOSIS — J4520 Mild intermittent asthma, uncomplicated: Secondary | ICD-10-CM | POA: Diagnosis not present

## 2016-10-07 DIAGNOSIS — M159 Polyosteoarthritis, unspecified: Secondary | ICD-10-CM | POA: Diagnosis not present

## 2016-10-07 DIAGNOSIS — M79651 Pain in right thigh: Secondary | ICD-10-CM | POA: Diagnosis not present

## 2016-10-10 ENCOUNTER — Ambulatory Visit (HOSPITAL_BASED_OUTPATIENT_CLINIC_OR_DEPARTMENT_OTHER): Payer: Medicare Other | Admitting: Hematology and Oncology

## 2016-10-10 DIAGNOSIS — C50312 Malignant neoplasm of lower-inner quadrant of left female breast: Secondary | ICD-10-CM

## 2016-10-10 DIAGNOSIS — N951 Menopausal and female climacteric states: Secondary | ICD-10-CM

## 2016-10-10 DIAGNOSIS — D0512 Intraductal carcinoma in situ of left breast: Secondary | ICD-10-CM | POA: Diagnosis not present

## 2016-10-10 MED ORDER — TAMOXIFEN CITRATE 20 MG PO TABS: 20.0000 mg | ORAL_TABLET | Freq: Every day | ORAL | 3 refills | Status: DC

## 2016-10-10 NOTE — Addendum Note (Signed)
Addended by: Nicholas Lose on: 10/10/2016 12:24 PM   Modules accepted: Orders

## 2016-10-10 NOTE — Progress Notes (Signed)
Patient Care Team: Hoyt Koch, MD as PCP - General (Internal Medicine) Jovita Kussmaul, MD as Consulting Physician (General Surgery) Nicholas Lose, MD as Consulting Physician (Hematology and Oncology) Arloa Koh, MD as Consulting Physician (Radiation Oncology) Mauro Kaufmann, RN as Registered Nurse Rockwell Germany, RN as Registered Nurse Jake Shark Johny Blamer, NP as Nurse Practitioner (Nurse Practitioner)  DIAGNOSIS:  Encounter Diagnosis  Name Primary?  . 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      01/28/2015 Surgery    Left lumpectomy: DCIS 1.8 cm, margins negative, closest margin 0.3 cm, ALH,fibrocystic changes with calcifications, ER+ 100%, PR+ 100% low to intermediate grade      01/28/2015 Pathologic Stage    Stage 0: Tis Nx      04/08/2015 - 05/18/2015 Radiation Therapy    Adjuvant radiation therapy (Dr.Squire): Left breast / 50.4 Gy in 28 fractions      07/14/2015 -  Anti-estrogen oral therapy    Tamoxifen 20 mg daily to begin ~07/14/2015. Planned duration of therapy 5 years.      08/05/2015 Survivorship    Survivorship care plan mailed to patient in lieu of in person visit       CHIEF COMPLIANT: follow-up of left breast DCIS  INTERVAL HISTORY: Caitlyn Bautista is a 66 year old with above-mentioned history of left breast DCIS underwent lumpectomy followed by  radiation therapy. She had the breast mammoplasty surgery done. Patient tells weaknesses so her original surgery on the mammoplasty she feels that there is a lot of lymphedema. She also has difficulty with range of motion on the arms. She has had difficulty getting her antidepressants because of high co-pays and she was having financial difficulties. She has been on tamoxifen for about a year. She appears to be tolerating it moderately well. She does have hot flashes. She does break out on the face but unclear if it is related to tamoxifen.  REVIEW OF SYSTEMS:   Constitutional: Denies fevers, chills or abnormal weight loss Eyes: Denies blurriness of vision Ears, nose, mouth, throat, and face: Denies mucositis or sore throat Respiratory: Denies cough, dyspnea or wheezes Cardiovascular: Denies palpitation, chest discomfort Gastrointestinal:  Denies nausea, heartburn or change in bowel habits Skin: Denies abnormal skin rashes Lymphatics: Denies new lymphadenopathy or easy bruising Neurological:Denies numbness, tingling or new weaknesses Behavioral/Psych: Mood is stable, no new changes  Extremities: No lower extremity edema Breast: bilateral breast discomfort, reconstruction with mammoplasty reduction All other systems were reviewed with the patient and are negative.  I have reviewed the past medical history, past surgical history, social history and family history with the patient and they are unchanged from previous note.  ALLERGIES:  is allergic to ace inhibitors; other; decongestant [pseudoephedrine hcl]; and nsaids.  MEDICATIONS:  Current Outpatient Prescriptions  Medication Sig Dispense Refill  . amLODipine (NORVASC) 10 MG  tablet Take 1 tablet (10 mg total) by mouth daily. 90 tablet 0  . buPROPion (WELLBUTRIN XL) 150 MG 24 hr tablet Take 1 tablet (150 mg total) by mouth daily. 90 tablet 0  . diazepam (VALIUM) 5 MG tablet Take 1 tablet (5 mg total) by mouth 3 (three) times daily as needed  for anxiety. 30 tablet 0  . DULoxetine (CYMBALTA) 30 MG capsule Take 1 capsule (30 mg total) by mouth 2 (two) times daily. 180 capsule 0  . fexofenadine (ALLEGRA) 180 MG tablet Take 1 tablet (180 mg total) by mouth daily. For seasonal allergies.    . fluticasone (FLONASE) 50 MCG/ACT nasal spray Place 2 sprays into both nostrils daily. For seasonal allergies. 16 g 6  . fluticasone furoate-vilanterol (BREO ELLIPTA) 100-25 MCG/INH AEPB Inhale 1 puff into the lungs daily. 1 each 2  . Ginger, Zingiber officinalis, (GINGER PO) Take by mouth.    Marland Kitchen HYDROcodone-acetaminophen (NORCO) 10-325 MG per tablet Take 1 tablet by mouth every 8 (eight) hours as needed. (Patient not taking: Reported on 09/09/2016) 90 tablet 0  . lithium carbonate 300 MG capsule Take 1 capsule (300 mg total) by mouth 2 (two) times daily with a meal. 180 capsule 0  . mineral oil liquid Take 15 mLs by mouth daily as needed for moderate constipation.    . montelukast (SINGULAIR) 10 MG tablet Take 1 tablet (10 mg total) by mouth at bedtime. 90 tablet 0  . non-metallic deodorant (ALRA) MISC Apply 1 application topically daily as needed. Reported on 06/19/2015    . PROAIR HFA 108 (90 Base) MCG/ACT inhaler INHALE 2 PUFFS INTO THE LUNGS DAILY FOR WHEEZING 9 each 0  . ranitidine (ZANTAC) 75 MG tablet Take 1 tablet (75 mg total) by mouth 2 (two) times daily.    . tamoxifen (NOLVADEX) 20 MG tablet TAKE ONE TABLET BY MOUTH ONCE DAILY. 90 tablet 0  . Turmeric Curcumin 500 MG CAPS Take by mouth.     No current facility-administered medications for this visit.     PHYSICAL EXAMINATION: ECOG PERFORMANCE STATUS: 1 - Symptomatic but completely ambulatory  Vitals:   10/10/16 1132  BP: 128/78  Pulse: 88  Resp: 18  Temp: 98.1 F (36.7 C)   Filed Weights   10/10/16 1132  Weight: (!) 300 lb 4.8 oz (136.2 kg)    GENERAL:alert, no distress and comfortable SKIN: skin color, texture, turgor are normal, no rashes or significant lesions EYES:  normal, Conjunctiva are pink and non-injected, sclera clear OROPHARYNX:no exudate, no erythema and lips, buccal mucosa, and tongue normal  NECK: supple, thyroid normal size, non-tender, without nodularity LYMPH:  no palpable lymphadenopathy in the cervical, axillary or inguinal LUNGS: clear to auscultation and percussion with normal breathing effort HEART: regular rate & rhythm and no murmurs and no lower extremity edema ABDOMEN:abdomen soft, non-tender and normal bowel sounds MUSCULOSKELETAL:no cyanosis of digits and no clubbing  NEURO: alert & oriented x 3 with fluent speech, no focal motor/sensory deficits EXTREMITIES: No lower extremity edema Breast: Bilateral breasts are tender to palpation. I do not feel any lumps or nodules.keloids along surgical scars beneath the breast  LABORATORY DATA:  I have reviewed the data as listed   Chemistry      Component Value Date/Time   NA 142 06/23/2016 1016   NA 143 12/10/2014 1223   K 3.2 (L) 06/23/2016 1016   K 3.5 12/10/2014 1223   CL 108 06/23/2016 1016   CO2 25 06/23/2016 1016   CO2 27  12/10/2014 1223   BUN 13 06/23/2016 1016   BUN 12.1 12/10/2014 1223   CREATININE 0.77 06/23/2016 1016   CREATININE 0.8 12/10/2014 1223      Component Value Date/Time   CALCIUM 8.9 06/23/2016 1016   CALCIUM 8.8 12/10/2014 1223   ALKPHOS 96 06/23/2016 1016   ALKPHOS 115 12/10/2014 1223   AST 12 06/23/2016 1016   AST 14 12/10/2014 1223   ALT 13 06/23/2016 1016   ALT 14 12/10/2014 1223   BILITOT 0.3 06/23/2016 1016   BILITOT <0.30 12/10/2014 1223       Lab Results  Component Value Date   WBC 9.0 06/23/2016   HGB 10.9 (L) 06/23/2016   HCT 34.3 (L) 06/23/2016   MCV 84.1 06/23/2016   PLT 393.0 06/23/2016   NEUTROABS 2.9 12/10/2014    ASSESSMENT & PLAN:  Primary cancer of lower-inner quadrant of left female breast ([place]) left lumpectomy 01/28/2015: DCIS 1.8 cm, margins negative, closest margin 0.3 cm, ALH,fibrocystic changes with  calcifications, Tis N0 stage 0, ER 100%, PR 100% low to intermediate grade Completed adjuvant radiation therapy 04/08/2015 to 05/18/2015 (Dr.Squire)  Current treatment: Antiestrogen therapy with tamoxifen 5 years started May 2017  Tamoxifen toxicities: 1. Intermittent hot flashes  Patient had brest reduction surgery on the right side and left mammoplasty 10/22/2015: Benign fibrocystic changes with focal lobular hyperplasia.  Return to clinic in 1 year for follow-up   I spent 25 minutes talking to the patient of which more than half was spent in counseling and coordination of care.  No orders of the defined types were placed in this encounter.  The patient has a good understanding of the overall plan. she agrees with it. she will call with any problems that may develop before the next visit here.   Rulon Eisenmenger, MD 10/10/16

## 2016-10-10 NOTE — Assessment & Plan Note (Signed)
left lumpectomy 01/28/2015: DCIS 1.8 cm, margins negative, closest margin 0.3 cm, ALH,fibrocystic changes with calcifications, Tis N0 stage 0, ER 100%, PR 100% low to intermediate grade Completed adjuvant radiation therapy 04/08/2015 to 05/18/2015 (Dr.Squire)  Current treatment: Antiestrogen therapy with tamoxifen 5 years started May 2017  Tamoxifen toxicities: 1. Intermittent hot flashes  Patient had brest reduction surgery on the right side and left mammoplasty 10/22/2015: Benign fibrocystic changes with focal lobular hyperplasia.  Return to clinic in 1 year for follow-up

## 2016-10-11 DIAGNOSIS — I10 Essential (primary) hypertension: Secondary | ICD-10-CM | POA: Diagnosis not present

## 2016-10-11 DIAGNOSIS — G8929 Other chronic pain: Secondary | ICD-10-CM | POA: Diagnosis not present

## 2016-10-11 DIAGNOSIS — M79651 Pain in right thigh: Secondary | ICD-10-CM | POA: Diagnosis not present

## 2016-10-11 DIAGNOSIS — E785 Hyperlipidemia, unspecified: Secondary | ICD-10-CM | POA: Diagnosis not present

## 2016-10-11 DIAGNOSIS — Z9181 History of falling: Secondary | ICD-10-CM | POA: Diagnosis not present

## 2016-10-11 DIAGNOSIS — M159 Polyosteoarthritis, unspecified: Secondary | ICD-10-CM | POA: Diagnosis not present

## 2016-10-11 DIAGNOSIS — K219 Gastro-esophageal reflux disease without esophagitis: Secondary | ICD-10-CM | POA: Diagnosis not present

## 2016-10-11 DIAGNOSIS — M5430 Sciatica, unspecified side: Secondary | ICD-10-CM | POA: Diagnosis not present

## 2016-10-11 DIAGNOSIS — J4520 Mild intermittent asthma, uncomplicated: Secondary | ICD-10-CM | POA: Diagnosis not present

## 2016-10-11 DIAGNOSIS — Z7951 Long term (current) use of inhaled steroids: Secondary | ICD-10-CM | POA: Diagnosis not present

## 2016-10-11 DIAGNOSIS — M79652 Pain in left thigh: Secondary | ICD-10-CM | POA: Diagnosis not present

## 2016-10-11 DIAGNOSIS — M797 Fibromyalgia: Secondary | ICD-10-CM | POA: Diagnosis not present

## 2016-10-13 DIAGNOSIS — J4520 Mild intermittent asthma, uncomplicated: Secondary | ICD-10-CM | POA: Diagnosis not present

## 2016-10-13 DIAGNOSIS — M79652 Pain in left thigh: Secondary | ICD-10-CM | POA: Diagnosis not present

## 2016-10-13 DIAGNOSIS — M159 Polyosteoarthritis, unspecified: Secondary | ICD-10-CM | POA: Diagnosis not present

## 2016-10-13 DIAGNOSIS — Z7951 Long term (current) use of inhaled steroids: Secondary | ICD-10-CM | POA: Diagnosis not present

## 2016-10-13 DIAGNOSIS — G8929 Other chronic pain: Secondary | ICD-10-CM | POA: Diagnosis not present

## 2016-10-13 DIAGNOSIS — Z9181 History of falling: Secondary | ICD-10-CM | POA: Diagnosis not present

## 2016-10-13 DIAGNOSIS — M797 Fibromyalgia: Secondary | ICD-10-CM | POA: Diagnosis not present

## 2016-10-13 DIAGNOSIS — I10 Essential (primary) hypertension: Secondary | ICD-10-CM | POA: Diagnosis not present

## 2016-10-13 DIAGNOSIS — K219 Gastro-esophageal reflux disease without esophagitis: Secondary | ICD-10-CM | POA: Diagnosis not present

## 2016-10-13 DIAGNOSIS — M5430 Sciatica, unspecified side: Secondary | ICD-10-CM | POA: Diagnosis not present

## 2016-10-13 DIAGNOSIS — M79651 Pain in right thigh: Secondary | ICD-10-CM | POA: Diagnosis not present

## 2016-10-13 DIAGNOSIS — E785 Hyperlipidemia, unspecified: Secondary | ICD-10-CM | POA: Diagnosis not present

## 2016-10-14 DIAGNOSIS — G8929 Other chronic pain: Secondary | ICD-10-CM | POA: Diagnosis not present

## 2016-10-14 DIAGNOSIS — M79651 Pain in right thigh: Secondary | ICD-10-CM | POA: Diagnosis not present

## 2016-10-14 DIAGNOSIS — I10 Essential (primary) hypertension: Secondary | ICD-10-CM | POA: Diagnosis not present

## 2016-10-14 DIAGNOSIS — J4520 Mild intermittent asthma, uncomplicated: Secondary | ICD-10-CM | POA: Diagnosis not present

## 2016-10-14 DIAGNOSIS — M5430 Sciatica, unspecified side: Secondary | ICD-10-CM | POA: Diagnosis not present

## 2016-10-14 DIAGNOSIS — M797 Fibromyalgia: Secondary | ICD-10-CM | POA: Diagnosis not present

## 2016-10-14 DIAGNOSIS — Z7951 Long term (current) use of inhaled steroids: Secondary | ICD-10-CM | POA: Diagnosis not present

## 2016-10-14 DIAGNOSIS — E785 Hyperlipidemia, unspecified: Secondary | ICD-10-CM | POA: Diagnosis not present

## 2016-10-14 DIAGNOSIS — Z9181 History of falling: Secondary | ICD-10-CM | POA: Diagnosis not present

## 2016-10-14 DIAGNOSIS — K219 Gastro-esophageal reflux disease without esophagitis: Secondary | ICD-10-CM | POA: Diagnosis not present

## 2016-10-14 DIAGNOSIS — M159 Polyosteoarthritis, unspecified: Secondary | ICD-10-CM | POA: Diagnosis not present

## 2016-10-14 DIAGNOSIS — M79652 Pain in left thigh: Secondary | ICD-10-CM | POA: Diagnosis not present

## 2016-10-17 ENCOUNTER — Telehealth: Payer: Self-pay | Admitting: Internal Medicine

## 2016-10-17 DIAGNOSIS — M79651 Pain in right thigh: Secondary | ICD-10-CM | POA: Diagnosis not present

## 2016-10-17 DIAGNOSIS — K219 Gastro-esophageal reflux disease without esophagitis: Secondary | ICD-10-CM | POA: Diagnosis not present

## 2016-10-17 DIAGNOSIS — J4520 Mild intermittent asthma, uncomplicated: Secondary | ICD-10-CM | POA: Diagnosis not present

## 2016-10-17 DIAGNOSIS — E785 Hyperlipidemia, unspecified: Secondary | ICD-10-CM | POA: Diagnosis not present

## 2016-10-17 DIAGNOSIS — I10 Essential (primary) hypertension: Secondary | ICD-10-CM | POA: Diagnosis not present

## 2016-10-17 DIAGNOSIS — M79652 Pain in left thigh: Secondary | ICD-10-CM | POA: Diagnosis not present

## 2016-10-17 DIAGNOSIS — M159 Polyosteoarthritis, unspecified: Secondary | ICD-10-CM | POA: Diagnosis not present

## 2016-10-17 DIAGNOSIS — Z7951 Long term (current) use of inhaled steroids: Secondary | ICD-10-CM | POA: Diagnosis not present

## 2016-10-17 DIAGNOSIS — M797 Fibromyalgia: Secondary | ICD-10-CM | POA: Diagnosis not present

## 2016-10-17 DIAGNOSIS — M5430 Sciatica, unspecified side: Secondary | ICD-10-CM | POA: Diagnosis not present

## 2016-10-17 DIAGNOSIS — G8929 Other chronic pain: Secondary | ICD-10-CM | POA: Diagnosis not present

## 2016-10-17 DIAGNOSIS — Z9181 History of falling: Secondary | ICD-10-CM | POA: Diagnosis not present

## 2016-10-17 NOTE — Telephone Encounter (Signed)
WITH COMPLEXITY OF HER HEALTH HISTORY, I WILL RECOMMEND SHE MAINTAIN HER CARE WITH CURRENT PROVIDER AT THIS TIME.

## 2016-10-17 NOTE — Telephone Encounter (Signed)
Pt called requesting a transfer from Dr Sharlet Salina to [facility]. She did say that she was fine moving to the new [place] office. Is it okay for her to transfer?

## 2016-10-18 ENCOUNTER — Ambulatory Visit: Payer: Self-pay

## 2016-10-19 DIAGNOSIS — Z7951 Long term (current) use of inhaled steroids: Secondary | ICD-10-CM | POA: Diagnosis not present

## 2016-10-19 DIAGNOSIS — E785 Hyperlipidemia, unspecified: Secondary | ICD-10-CM | POA: Diagnosis not present

## 2016-10-19 DIAGNOSIS — M5430 Sciatica, unspecified side: Secondary | ICD-10-CM | POA: Diagnosis not present

## 2016-10-19 DIAGNOSIS — K219 Gastro-esophageal reflux disease without esophagitis: Secondary | ICD-10-CM | POA: Diagnosis not present

## 2016-10-19 DIAGNOSIS — F319 Bipolar disorder, unspecified: Secondary | ICD-10-CM | POA: Diagnosis not present

## 2016-10-19 DIAGNOSIS — I10 Essential (primary) hypertension: Secondary | ICD-10-CM | POA: Diagnosis not present

## 2016-10-19 DIAGNOSIS — J4520 Mild intermittent asthma, uncomplicated: Secondary | ICD-10-CM | POA: Diagnosis not present

## 2016-10-19 DIAGNOSIS — G8929 Other chronic pain: Secondary | ICD-10-CM | POA: Diagnosis not present

## 2016-10-19 DIAGNOSIS — M79651 Pain in right thigh: Secondary | ICD-10-CM | POA: Diagnosis not present

## 2016-10-19 DIAGNOSIS — M797 Fibromyalgia: Secondary | ICD-10-CM | POA: Diagnosis not present

## 2016-10-19 DIAGNOSIS — M79652 Pain in left thigh: Secondary | ICD-10-CM | POA: Diagnosis not present

## 2016-10-19 DIAGNOSIS — M159 Polyosteoarthritis, unspecified: Secondary | ICD-10-CM | POA: Diagnosis not present

## 2016-10-19 DIAGNOSIS — Z9181 History of falling: Secondary | ICD-10-CM | POA: Diagnosis not present

## 2016-10-20 NOTE — Telephone Encounter (Signed)
LM informing pt.

## 2016-10-25 DIAGNOSIS — G8929 Other chronic pain: Secondary | ICD-10-CM | POA: Diagnosis not present

## 2016-10-25 DIAGNOSIS — M797 Fibromyalgia: Secondary | ICD-10-CM | POA: Diagnosis not present

## 2016-10-25 DIAGNOSIS — J4520 Mild intermittent asthma, uncomplicated: Secondary | ICD-10-CM | POA: Diagnosis not present

## 2016-10-25 DIAGNOSIS — K219 Gastro-esophageal reflux disease without esophagitis: Secondary | ICD-10-CM | POA: Diagnosis not present

## 2016-10-25 DIAGNOSIS — Z9181 History of falling: Secondary | ICD-10-CM | POA: Diagnosis not present

## 2016-10-25 DIAGNOSIS — M5430 Sciatica, unspecified side: Secondary | ICD-10-CM | POA: Diagnosis not present

## 2016-10-25 DIAGNOSIS — M79652 Pain in left thigh: Secondary | ICD-10-CM | POA: Diagnosis not present

## 2016-10-25 DIAGNOSIS — Z7951 Long term (current) use of inhaled steroids: Secondary | ICD-10-CM | POA: Diagnosis not present

## 2016-10-25 DIAGNOSIS — M159 Polyosteoarthritis, unspecified: Secondary | ICD-10-CM | POA: Diagnosis not present

## 2016-10-25 DIAGNOSIS — I10 Essential (primary) hypertension: Secondary | ICD-10-CM | POA: Diagnosis not present

## 2016-10-25 DIAGNOSIS — E785 Hyperlipidemia, unspecified: Secondary | ICD-10-CM | POA: Diagnosis not present

## 2016-10-25 DIAGNOSIS — M79651 Pain in right thigh: Secondary | ICD-10-CM | POA: Diagnosis not present

## 2016-10-28 ENCOUNTER — Ambulatory Visit: Payer: Self-pay

## 2016-10-28 NOTE — Progress Notes (Deleted)
Pre visit review using our clinic review tool, if applicable. No additional management support is needed unless otherwise documented below in the visit note. 

## 2016-10-28 NOTE — Progress Notes (Deleted)
Subjective:   Caitlyn Bautista is a 66 y.o. female who presents for Medicare Annual (Subsequent) preventive examination.  Review of Systems:  No ROS.  Medicare Wellness Visit. Additional risk factors are reflected in the social history.    Sleep patterns: {SX; SLEEP PATTERNS:18802::"feels rested on waking","does not get up to void","gets up *** times nightly to void","sleeps *** hours nightly"}.    Home Safety/Smoke Alarms: Feels safe in home. Smoke alarms in place.  Living environment; residence and Firearm Safety: {Rehab home environment / accessibility:30080::"no firearms","firearms stored safely"}. Seat Belt Safety/Bike Helmet: Wears seat belt.      Objective:     Vitals: There were no vitals taken for this visit.  There is no height or weight on file to calculate BMI.   Tobacco History  Smoking Status  . Never Smoker  Smokeless Tobacco  . Never Used     Counseling given: Not Answered   Past Medical History:  Diagnosis Date  . Allergy   . Anemia   . Anxiety   . Arachnoiditis   . Arthritis   . Asthma   . Bipolar disorder ([place])   . Cancer of lower-inner quadrant of female breast ([place]) 12/05/2014  . Carpal tunnel syndrome   . Carpal tunnel syndrome, bilateral   . Cervical herniated disc   . Depression    bipolar depression  . Fibromyalgia   . GERD (gastroesophageal reflux disease)   . History of bronchitis   . History of chicken pox   . History of pneumonia 1996  . Hyperlipidemia    pt. denies  . Hypertension   . Incontinence of urine in female   . PTSD (post-traumatic stress disorder)   . Scoliosis   . Sleep apnea    does not use her CPAP  . Thyroid disease    "very mild"  . Urinary frequency   . Urinary urgency    Past Surgical History:  Procedure Laterality Date  . ABDOMINAL HYSTERECTOMY  1990's  . BREAST LUMPECTOMY  1970's   right  . BREAST LUMPECTOMY WITH NEEDLE LOCALIZATION Left 01/28/2015   Procedure: LEFT BREAST LUMPECTOMY WITH NEEDLE  LOCALIZATION;  Surgeon: [name], MD;  Location: [facility];  Service: General;  Laterality: Left;  . BREAST REDUCTION SURGERY Left 01/28/2015   Procedure: BREAST REDUCTION;  Surgeon: Loel Lofty Dillingham, DO;  Location: [facility];  Service: Plastics;  Laterality: Left;  . BREAST REDUCTION SURGERY Right 10/22/2015   Procedure: RIGHT MAMMARY REDUCTION  (BREAST) FOR SYMMETRY;  Surgeon: Wallace Going, DO;  Location: [facility];  Service: Plastics;  Laterality: Right;  . COLONOSCOPY    . DILATION AND CURETTAGE OF UTERUS    . TUBAL LIGATION     Family History  Problem Relation Age of Onset  . Cancer Mother        unspecified type; dx. 51 or younger  . Ovarian cancer Sister 36  . Breast cancer Maternal Aunt        bilateral - dx. 30s, 65s  . Ovarian cancer Maternal Grandmother 100  . Ovarian cancer Sister 1  . Cervical cancer Sister 22  . Pancreatic cancer Maternal Uncle        dx. 50s-60s  . Cancer Cousin        either ovarian or cervical cancer  . Cancer Daughter        hx of TAH-BSO due to fibroids and bleeding  . Arthritis Other        Parent  .  Hypertension Other        Parent  . Arthritis Other        Grandparent  . Hypertension Other        Grandparent  . Arthritis Other        Other Blood Relative  . Hypertension Other        Other Blood Relative  . Diabetes Other        Other Blood Relative   History  Sexual Activity  . Sexual activity: No    Outpatient Encounter Prescriptions as of 10/28/2016  Medication Sig  . amLODipine (NORVASC) 10 MG tablet Take 1 tablet (10 mg total) by mouth daily.  Marland Kitchen buPROPion (WELLBUTRIN XL) 150 MG 24 hr tablet Take 1 tablet (150 mg total) by mouth daily.  . diazepam (VALIUM) 5 MG tablet Take 1 tablet (5 mg total) by mouth 3 (three) times daily as needed for anxiety.  . DULoxetine (CYMBALTA) 30 MG capsule Take 1 capsule (30 mg total) by mouth 2 (two) times daily.  . fexofenadine (ALLEGRA) 180 MG tablet Take 1 tablet (180  mg total) by mouth daily. For seasonal allergies.  . fluticasone (FLONASE) 50 MCG/ACT nasal spray Place 2 sprays into both nostrils daily. For seasonal allergies.  . fluticasone furoate-vilanterol (BREO ELLIPTA) 100-25 MCG/INH AEPB Inhale 1 puff into the lungs daily.  . Ginger, Zingiber officinalis, (GINGER PO) Take by mouth.  Marland Kitchen HYDROcodone-acetaminophen (NORCO) 10-325 MG per tablet Take 1 tablet by mouth every 8 (eight) hours as needed. (Patient not taking: Reported on 09/09/2016)  . lithium carbonate 300 MG capsule Take 1 capsule (300 mg total) by mouth 2 (two) times daily with a meal.  . mineral oil liquid Take 15 mLs by mouth daily as needed for moderate constipation.  . montelukast (SINGULAIR) 10 MG tablet Take 1 tablet (10 mg total) by mouth at bedtime.  . non-metallic deodorant Jethro Poling) MISC Apply 1 application topically daily as needed. Reported on 06/19/2015  . PROAIR HFA 108 (90 Base) MCG/ACT inhaler INHALE 2 PUFFS INTO THE LUNGS DAILY FOR WHEEZING  . ranitidine (ZANTAC) 75 MG tablet Take 1 tablet (75 mg total) by mouth 2 (two) times daily.  . tamoxifen (NOLVADEX) 20 MG tablet Take 1 tablet (20 mg total) by mouth daily.  . Turmeric Curcumin 500 MG CAPS Take by mouth.   No facility-administered encounter medications on file as of 10/28/2016.     Activities of Daily Living No flowsheet data found.  Patient Care Team: Hoyt Koch, MD as PCP - General (Internal Medicine) Jovita Kussmaul, MD as Consulting Physician (General Surgery) Nicholas Lose, MD as Consulting Physician (Hematology and Oncology) Arloa Koh, MD as Consulting Physician (Radiation Oncology) Mauro Kaufmann, RN as Registered Nurse Rockwell Germany, RN as Registered Nurse Jake Shark Johny Blamer, NP as Nurse Practitioner (Nurse Practitioner)    Assessment:    Physical assessment deferred to PCP.  Exercise Activities and Dietary recommendations   Diet (meal preparation, eat out, water intake, caffeinated  beverages, dairy products, fruits and vegetables): {Desc; diets:16563}  Goals      Patient Stated   . less pain (pt-stated)          Less pain would assist her with her social and physical goals      Fall Risk Fall Risk  06/19/2015 02/11/2015 09/16/2014 10/11/2012  Falls in the past year? No No No No  Comment - - just about tripped -   Depression Screen [facility] 2/9 Scores 06/19/2015 06/19/2015 02/11/2015 09/16/2014  PHQ - 2 Score 4 4 1 2   PHQ- 9 Score 14 - - 12     Cognitive Function        Immunization History  Administered Date(s) Administered  . Tdap 08/06/2007   Screening Tests Health Maintenance  Topic Date Due  . Hepatitis C Screening  12-07-1950  . HIV Screening  12/28/1967  . PAP SMEAR  03/07/2016  . INFLUENZA VACCINE  10/05/2016  . MAMMOGRAM  12/01/2016  . TETANUS/TDAP  08/05/2017  . COLONOSCOPY  03/07/2021      Plan:     I have personally reviewed and noted the following in the patient's chart:   . Medical and social history . Use of alcohol, tobacco or illicit drugs  . Current medications and supplements . Functional ability and status . Nutritional status . Physical activity . Advanced directives . List of other physicians . Vitals . Screenings to include cognitive, depression, and falls . Referrals and appointments  In addition, I have reviewed and discussed with patient certain preventive protocols, quality metrics, and best practice recommendations. A written personalized care plan for preventive services as well as general preventive health recommendations were provided to patient.     Michiel Cowboy, RN  10/28/2016

## 2016-11-03 DIAGNOSIS — Z7951 Long term (current) use of inhaled steroids: Secondary | ICD-10-CM | POA: Diagnosis not present

## 2016-11-03 DIAGNOSIS — M159 Polyosteoarthritis, unspecified: Secondary | ICD-10-CM | POA: Diagnosis not present

## 2016-11-03 DIAGNOSIS — M79652 Pain in left thigh: Secondary | ICD-10-CM | POA: Diagnosis not present

## 2016-11-03 DIAGNOSIS — M797 Fibromyalgia: Secondary | ICD-10-CM | POA: Diagnosis not present

## 2016-11-03 DIAGNOSIS — I10 Essential (primary) hypertension: Secondary | ICD-10-CM | POA: Diagnosis not present

## 2016-11-03 DIAGNOSIS — K219 Gastro-esophageal reflux disease without esophagitis: Secondary | ICD-10-CM | POA: Diagnosis not present

## 2016-11-03 DIAGNOSIS — Z9181 History of falling: Secondary | ICD-10-CM | POA: Diagnosis not present

## 2016-11-03 DIAGNOSIS — M5430 Sciatica, unspecified side: Secondary | ICD-10-CM | POA: Diagnosis not present

## 2016-11-03 DIAGNOSIS — G8929 Other chronic pain: Secondary | ICD-10-CM | POA: Diagnosis not present

## 2016-11-03 DIAGNOSIS — E785 Hyperlipidemia, unspecified: Secondary | ICD-10-CM | POA: Diagnosis not present

## 2016-11-03 DIAGNOSIS — M79651 Pain in right thigh: Secondary | ICD-10-CM | POA: Diagnosis not present

## 2016-11-03 DIAGNOSIS — J4520 Mild intermittent asthma, uncomplicated: Secondary | ICD-10-CM | POA: Diagnosis not present

## 2016-11-04 DIAGNOSIS — Z9181 History of falling: Secondary | ICD-10-CM | POA: Diagnosis not present

## 2016-11-04 DIAGNOSIS — Z7951 Long term (current) use of inhaled steroids: Secondary | ICD-10-CM | POA: Diagnosis not present

## 2016-11-04 DIAGNOSIS — K219 Gastro-esophageal reflux disease without esophagitis: Secondary | ICD-10-CM | POA: Diagnosis not present

## 2016-11-04 DIAGNOSIS — J4520 Mild intermittent asthma, uncomplicated: Secondary | ICD-10-CM | POA: Diagnosis not present

## 2016-11-04 DIAGNOSIS — I10 Essential (primary) hypertension: Secondary | ICD-10-CM | POA: Diagnosis not present

## 2016-11-04 DIAGNOSIS — M79651 Pain in right thigh: Secondary | ICD-10-CM | POA: Diagnosis not present

## 2016-11-04 DIAGNOSIS — M797 Fibromyalgia: Secondary | ICD-10-CM | POA: Diagnosis not present

## 2016-11-04 DIAGNOSIS — M159 Polyosteoarthritis, unspecified: Secondary | ICD-10-CM | POA: Diagnosis not present

## 2016-11-04 DIAGNOSIS — M5430 Sciatica, unspecified side: Secondary | ICD-10-CM | POA: Diagnosis not present

## 2016-11-04 DIAGNOSIS — M79652 Pain in left thigh: Secondary | ICD-10-CM | POA: Diagnosis not present

## 2016-11-04 DIAGNOSIS — E785 Hyperlipidemia, unspecified: Secondary | ICD-10-CM | POA: Diagnosis not present

## 2016-11-04 DIAGNOSIS — G8929 Other chronic pain: Secondary | ICD-10-CM | POA: Diagnosis not present

## 2016-11-08 DIAGNOSIS — J4520 Mild intermittent asthma, uncomplicated: Secondary | ICD-10-CM | POA: Diagnosis not present

## 2016-11-08 DIAGNOSIS — M797 Fibromyalgia: Secondary | ICD-10-CM | POA: Diagnosis not present

## 2016-11-08 DIAGNOSIS — E785 Hyperlipidemia, unspecified: Secondary | ICD-10-CM | POA: Diagnosis not present

## 2016-11-08 DIAGNOSIS — M5430 Sciatica, unspecified side: Secondary | ICD-10-CM | POA: Diagnosis not present

## 2016-11-08 DIAGNOSIS — Z9181 History of falling: Secondary | ICD-10-CM | POA: Diagnosis not present

## 2016-11-08 DIAGNOSIS — M79652 Pain in left thigh: Secondary | ICD-10-CM | POA: Diagnosis not present

## 2016-11-08 DIAGNOSIS — K219 Gastro-esophageal reflux disease without esophagitis: Secondary | ICD-10-CM | POA: Diagnosis not present

## 2016-11-08 DIAGNOSIS — M79651 Pain in right thigh: Secondary | ICD-10-CM | POA: Diagnosis not present

## 2016-11-08 DIAGNOSIS — I10 Essential (primary) hypertension: Secondary | ICD-10-CM | POA: Diagnosis not present

## 2016-11-08 DIAGNOSIS — M159 Polyosteoarthritis, unspecified: Secondary | ICD-10-CM | POA: Diagnosis not present

## 2016-11-08 DIAGNOSIS — Z7951 Long term (current) use of inhaled steroids: Secondary | ICD-10-CM | POA: Diagnosis not present

## 2016-11-08 DIAGNOSIS — G8929 Other chronic pain: Secondary | ICD-10-CM | POA: Diagnosis not present

## 2016-11-10 DIAGNOSIS — E785 Hyperlipidemia, unspecified: Secondary | ICD-10-CM | POA: Diagnosis not present

## 2016-11-10 DIAGNOSIS — K219 Gastro-esophageal reflux disease without esophagitis: Secondary | ICD-10-CM | POA: Diagnosis not present

## 2016-11-10 DIAGNOSIS — I10 Essential (primary) hypertension: Secondary | ICD-10-CM | POA: Diagnosis not present

## 2016-11-10 DIAGNOSIS — M79651 Pain in right thigh: Secondary | ICD-10-CM | POA: Diagnosis not present

## 2016-11-10 DIAGNOSIS — G8929 Other chronic pain: Secondary | ICD-10-CM | POA: Diagnosis not present

## 2016-11-10 DIAGNOSIS — Z7951 Long term (current) use of inhaled steroids: Secondary | ICD-10-CM | POA: Diagnosis not present

## 2016-11-10 DIAGNOSIS — J4520 Mild intermittent asthma, uncomplicated: Secondary | ICD-10-CM | POA: Diagnosis not present

## 2016-11-10 DIAGNOSIS — M5430 Sciatica, unspecified side: Secondary | ICD-10-CM | POA: Diagnosis not present

## 2016-11-10 DIAGNOSIS — Z9181 History of falling: Secondary | ICD-10-CM | POA: Diagnosis not present

## 2016-11-10 DIAGNOSIS — M797 Fibromyalgia: Secondary | ICD-10-CM | POA: Diagnosis not present

## 2016-11-10 DIAGNOSIS — M79652 Pain in left thigh: Secondary | ICD-10-CM | POA: Diagnosis not present

## 2016-11-10 DIAGNOSIS — M159 Polyosteoarthritis, unspecified: Secondary | ICD-10-CM | POA: Diagnosis not present

## 2016-11-15 DIAGNOSIS — J4520 Mild intermittent asthma, uncomplicated: Secondary | ICD-10-CM | POA: Diagnosis not present

## 2016-11-15 DIAGNOSIS — Z9181 History of falling: Secondary | ICD-10-CM | POA: Diagnosis not present

## 2016-11-15 DIAGNOSIS — K219 Gastro-esophageal reflux disease without esophagitis: Secondary | ICD-10-CM | POA: Diagnosis not present

## 2016-11-15 DIAGNOSIS — M79651 Pain in right thigh: Secondary | ICD-10-CM | POA: Diagnosis not present

## 2016-11-15 DIAGNOSIS — M797 Fibromyalgia: Secondary | ICD-10-CM | POA: Diagnosis not present

## 2016-11-15 DIAGNOSIS — Z7951 Long term (current) use of inhaled steroids: Secondary | ICD-10-CM | POA: Diagnosis not present

## 2016-11-15 DIAGNOSIS — M79652 Pain in left thigh: Secondary | ICD-10-CM | POA: Diagnosis not present

## 2016-11-15 DIAGNOSIS — M5430 Sciatica, unspecified side: Secondary | ICD-10-CM | POA: Diagnosis not present

## 2016-11-15 DIAGNOSIS — I10 Essential (primary) hypertension: Secondary | ICD-10-CM | POA: Diagnosis not present

## 2016-11-15 DIAGNOSIS — M159 Polyosteoarthritis, unspecified: Secondary | ICD-10-CM | POA: Diagnosis not present

## 2016-11-15 DIAGNOSIS — G8929 Other chronic pain: Secondary | ICD-10-CM | POA: Diagnosis not present

## 2016-11-15 DIAGNOSIS — E785 Hyperlipidemia, unspecified: Secondary | ICD-10-CM | POA: Diagnosis not present

## 2016-11-17 DIAGNOSIS — M5430 Sciatica, unspecified side: Secondary | ICD-10-CM | POA: Diagnosis not present

## 2016-11-17 DIAGNOSIS — Z7951 Long term (current) use of inhaled steroids: Secondary | ICD-10-CM | POA: Diagnosis not present

## 2016-11-17 DIAGNOSIS — M797 Fibromyalgia: Secondary | ICD-10-CM | POA: Diagnosis not present

## 2016-11-17 DIAGNOSIS — M159 Polyosteoarthritis, unspecified: Secondary | ICD-10-CM | POA: Diagnosis not present

## 2016-11-17 DIAGNOSIS — M79651 Pain in right thigh: Secondary | ICD-10-CM | POA: Diagnosis not present

## 2016-11-17 DIAGNOSIS — K219 Gastro-esophageal reflux disease without esophagitis: Secondary | ICD-10-CM | POA: Diagnosis not present

## 2016-11-17 DIAGNOSIS — J4520 Mild intermittent asthma, uncomplicated: Secondary | ICD-10-CM | POA: Diagnosis not present

## 2016-11-17 DIAGNOSIS — E785 Hyperlipidemia, unspecified: Secondary | ICD-10-CM | POA: Diagnosis not present

## 2016-11-17 DIAGNOSIS — G8929 Other chronic pain: Secondary | ICD-10-CM | POA: Diagnosis not present

## 2016-11-17 DIAGNOSIS — I10 Essential (primary) hypertension: Secondary | ICD-10-CM | POA: Diagnosis not present

## 2016-11-17 DIAGNOSIS — M79652 Pain in left thigh: Secondary | ICD-10-CM | POA: Diagnosis not present

## 2016-11-17 DIAGNOSIS — Z9181 History of falling: Secondary | ICD-10-CM | POA: Diagnosis not present

## 2016-11-29 DIAGNOSIS — M159 Polyosteoarthritis, unspecified: Secondary | ICD-10-CM | POA: Diagnosis not present

## 2016-11-29 DIAGNOSIS — I10 Essential (primary) hypertension: Secondary | ICD-10-CM | POA: Diagnosis not present

## 2016-11-29 DIAGNOSIS — E785 Hyperlipidemia, unspecified: Secondary | ICD-10-CM | POA: Diagnosis not present

## 2016-11-29 DIAGNOSIS — K219 Gastro-esophageal reflux disease without esophagitis: Secondary | ICD-10-CM | POA: Diagnosis not present

## 2016-11-29 DIAGNOSIS — Z9181 History of falling: Secondary | ICD-10-CM | POA: Diagnosis not present

## 2016-11-29 DIAGNOSIS — J4520 Mild intermittent asthma, uncomplicated: Secondary | ICD-10-CM | POA: Diagnosis not present

## 2016-11-29 DIAGNOSIS — M79651 Pain in right thigh: Secondary | ICD-10-CM | POA: Diagnosis not present

## 2016-11-29 DIAGNOSIS — M79652 Pain in left thigh: Secondary | ICD-10-CM | POA: Diagnosis not present

## 2016-11-29 DIAGNOSIS — Z7951 Long term (current) use of inhaled steroids: Secondary | ICD-10-CM | POA: Diagnosis not present

## 2016-11-29 DIAGNOSIS — G8929 Other chronic pain: Secondary | ICD-10-CM | POA: Diagnosis not present

## 2016-11-29 DIAGNOSIS — M5430 Sciatica, unspecified side: Secondary | ICD-10-CM | POA: Diagnosis not present

## 2016-11-29 DIAGNOSIS — M797 Fibromyalgia: Secondary | ICD-10-CM | POA: Diagnosis not present

## 2016-12-01 DIAGNOSIS — I10 Essential (primary) hypertension: Secondary | ICD-10-CM | POA: Diagnosis not present

## 2016-12-01 DIAGNOSIS — E785 Hyperlipidemia, unspecified: Secondary | ICD-10-CM | POA: Diagnosis not present

## 2016-12-01 DIAGNOSIS — M159 Polyosteoarthritis, unspecified: Secondary | ICD-10-CM | POA: Diagnosis not present

## 2016-12-01 DIAGNOSIS — K219 Gastro-esophageal reflux disease without esophagitis: Secondary | ICD-10-CM | POA: Diagnosis not present

## 2016-12-01 DIAGNOSIS — Z9181 History of falling: Secondary | ICD-10-CM | POA: Diagnosis not present

## 2016-12-01 DIAGNOSIS — M5430 Sciatica, unspecified side: Secondary | ICD-10-CM | POA: Diagnosis not present

## 2016-12-01 DIAGNOSIS — G8929 Other chronic pain: Secondary | ICD-10-CM | POA: Diagnosis not present

## 2016-12-01 DIAGNOSIS — J4520 Mild intermittent asthma, uncomplicated: Secondary | ICD-10-CM | POA: Diagnosis not present

## 2016-12-01 DIAGNOSIS — Z7951 Long term (current) use of inhaled steroids: Secondary | ICD-10-CM | POA: Diagnosis not present

## 2016-12-01 DIAGNOSIS — M797 Fibromyalgia: Secondary | ICD-10-CM | POA: Diagnosis not present

## 2016-12-02 ENCOUNTER — Telehealth: Payer: Self-pay | Admitting: Internal Medicine

## 2016-12-02 DIAGNOSIS — M797 Fibromyalgia: Secondary | ICD-10-CM | POA: Diagnosis not present

## 2016-12-02 DIAGNOSIS — E785 Hyperlipidemia, unspecified: Secondary | ICD-10-CM | POA: Diagnosis not present

## 2016-12-02 DIAGNOSIS — M159 Polyosteoarthritis, unspecified: Secondary | ICD-10-CM | POA: Diagnosis not present

## 2016-12-02 DIAGNOSIS — J4520 Mild intermittent asthma, uncomplicated: Secondary | ICD-10-CM | POA: Diagnosis not present

## 2016-12-02 DIAGNOSIS — G8929 Other chronic pain: Secondary | ICD-10-CM | POA: Diagnosis not present

## 2016-12-02 DIAGNOSIS — Z9181 History of falling: Secondary | ICD-10-CM | POA: Diagnosis not present

## 2016-12-02 DIAGNOSIS — Z7951 Long term (current) use of inhaled steroids: Secondary | ICD-10-CM | POA: Diagnosis not present

## 2016-12-02 DIAGNOSIS — K219 Gastro-esophageal reflux disease without esophagitis: Secondary | ICD-10-CM | POA: Diagnosis not present

## 2016-12-02 DIAGNOSIS — I10 Essential (primary) hypertension: Secondary | ICD-10-CM | POA: Diagnosis not present

## 2016-12-02 DIAGNOSIS — M5430 Sciatica, unspecified side: Secondary | ICD-10-CM | POA: Diagnosis not present

## 2016-12-02 NOTE — Telephone Encounter (Signed)
Verbal orders for; Speech theapy   2 x a week for 3 weeks  [phone] - Alla Feeling

## 2016-12-05 NOTE — Telephone Encounter (Signed)
Fine

## 2016-12-05 NOTE — Telephone Encounter (Signed)
Verbal orders given  

## 2016-12-06 DIAGNOSIS — M797 Fibromyalgia: Secondary | ICD-10-CM | POA: Diagnosis not present

## 2016-12-06 DIAGNOSIS — M5430 Sciatica, unspecified side: Secondary | ICD-10-CM | POA: Diagnosis not present

## 2016-12-06 DIAGNOSIS — G8929 Other chronic pain: Secondary | ICD-10-CM | POA: Diagnosis not present

## 2016-12-06 DIAGNOSIS — J4520 Mild intermittent asthma, uncomplicated: Secondary | ICD-10-CM | POA: Diagnosis not present

## 2016-12-06 DIAGNOSIS — E785 Hyperlipidemia, unspecified: Secondary | ICD-10-CM | POA: Diagnosis not present

## 2016-12-06 DIAGNOSIS — Z9181 History of falling: Secondary | ICD-10-CM | POA: Diagnosis not present

## 2016-12-06 DIAGNOSIS — I10 Essential (primary) hypertension: Secondary | ICD-10-CM | POA: Diagnosis not present

## 2016-12-06 DIAGNOSIS — Z7951 Long term (current) use of inhaled steroids: Secondary | ICD-10-CM | POA: Diagnosis not present

## 2016-12-06 DIAGNOSIS — M159 Polyosteoarthritis, unspecified: Secondary | ICD-10-CM | POA: Diagnosis not present

## 2016-12-06 DIAGNOSIS — K219 Gastro-esophageal reflux disease without esophagitis: Secondary | ICD-10-CM | POA: Diagnosis not present

## 2016-12-07 DIAGNOSIS — Z9181 History of falling: Secondary | ICD-10-CM | POA: Diagnosis not present

## 2016-12-07 DIAGNOSIS — E785 Hyperlipidemia, unspecified: Secondary | ICD-10-CM | POA: Diagnosis not present

## 2016-12-07 DIAGNOSIS — K219 Gastro-esophageal reflux disease without esophagitis: Secondary | ICD-10-CM | POA: Diagnosis not present

## 2016-12-07 DIAGNOSIS — I10 Essential (primary) hypertension: Secondary | ICD-10-CM | POA: Diagnosis not present

## 2016-12-07 DIAGNOSIS — M5430 Sciatica, unspecified side: Secondary | ICD-10-CM | POA: Diagnosis not present

## 2016-12-07 DIAGNOSIS — M797 Fibromyalgia: Secondary | ICD-10-CM | POA: Diagnosis not present

## 2016-12-07 DIAGNOSIS — J4520 Mild intermittent asthma, uncomplicated: Secondary | ICD-10-CM | POA: Diagnosis not present

## 2016-12-07 DIAGNOSIS — Z7951 Long term (current) use of inhaled steroids: Secondary | ICD-10-CM | POA: Diagnosis not present

## 2016-12-07 DIAGNOSIS — M159 Polyosteoarthritis, unspecified: Secondary | ICD-10-CM | POA: Diagnosis not present

## 2016-12-07 DIAGNOSIS — G8929 Other chronic pain: Secondary | ICD-10-CM | POA: Diagnosis not present

## 2016-12-12 ENCOUNTER — Ambulatory Visit: Payer: Self-pay | Admitting: Internal Medicine

## 2016-12-13 DIAGNOSIS — Z9181 History of falling: Secondary | ICD-10-CM | POA: Diagnosis not present

## 2016-12-13 DIAGNOSIS — G8929 Other chronic pain: Secondary | ICD-10-CM | POA: Diagnosis not present

## 2016-12-13 DIAGNOSIS — K219 Gastro-esophageal reflux disease without esophagitis: Secondary | ICD-10-CM | POA: Diagnosis not present

## 2016-12-13 DIAGNOSIS — I10 Essential (primary) hypertension: Secondary | ICD-10-CM | POA: Diagnosis not present

## 2016-12-13 DIAGNOSIS — M797 Fibromyalgia: Secondary | ICD-10-CM | POA: Diagnosis not present

## 2016-12-13 DIAGNOSIS — E785 Hyperlipidemia, unspecified: Secondary | ICD-10-CM | POA: Diagnosis not present

## 2016-12-13 DIAGNOSIS — Z7951 Long term (current) use of inhaled steroids: Secondary | ICD-10-CM | POA: Diagnosis not present

## 2016-12-13 DIAGNOSIS — M159 Polyosteoarthritis, unspecified: Secondary | ICD-10-CM | POA: Diagnosis not present

## 2016-12-13 DIAGNOSIS — J4520 Mild intermittent asthma, uncomplicated: Secondary | ICD-10-CM | POA: Diagnosis not present

## 2016-12-13 DIAGNOSIS — M5430 Sciatica, unspecified side: Secondary | ICD-10-CM | POA: Diagnosis not present

## 2016-12-14 DIAGNOSIS — M5430 Sciatica, unspecified side: Secondary | ICD-10-CM | POA: Diagnosis not present

## 2016-12-14 DIAGNOSIS — M797 Fibromyalgia: Secondary | ICD-10-CM | POA: Diagnosis not present

## 2016-12-14 DIAGNOSIS — Z7951 Long term (current) use of inhaled steroids: Secondary | ICD-10-CM | POA: Diagnosis not present

## 2016-12-14 DIAGNOSIS — I10 Essential (primary) hypertension: Secondary | ICD-10-CM | POA: Diagnosis not present

## 2016-12-14 DIAGNOSIS — E785 Hyperlipidemia, unspecified: Secondary | ICD-10-CM | POA: Diagnosis not present

## 2016-12-14 DIAGNOSIS — M159 Polyosteoarthritis, unspecified: Secondary | ICD-10-CM | POA: Diagnosis not present

## 2016-12-14 DIAGNOSIS — J4520 Mild intermittent asthma, uncomplicated: Secondary | ICD-10-CM | POA: Diagnosis not present

## 2016-12-14 DIAGNOSIS — K219 Gastro-esophageal reflux disease without esophagitis: Secondary | ICD-10-CM | POA: Diagnosis not present

## 2016-12-14 DIAGNOSIS — Z9181 History of falling: Secondary | ICD-10-CM | POA: Diagnosis not present

## 2016-12-14 DIAGNOSIS — G8929 Other chronic pain: Secondary | ICD-10-CM | POA: Diagnosis not present

## 2016-12-15 ENCOUNTER — Other Ambulatory Visit: Payer: Self-pay | Admitting: Family

## 2016-12-15 DIAGNOSIS — M797 Fibromyalgia: Secondary | ICD-10-CM | POA: Diagnosis not present

## 2016-12-15 DIAGNOSIS — K219 Gastro-esophageal reflux disease without esophagitis: Secondary | ICD-10-CM | POA: Diagnosis not present

## 2016-12-15 DIAGNOSIS — I10 Essential (primary) hypertension: Secondary | ICD-10-CM | POA: Diagnosis not present

## 2016-12-15 DIAGNOSIS — Z7951 Long term (current) use of inhaled steroids: Secondary | ICD-10-CM | POA: Diagnosis not present

## 2016-12-15 DIAGNOSIS — J4520 Mild intermittent asthma, uncomplicated: Secondary | ICD-10-CM | POA: Diagnosis not present

## 2016-12-15 DIAGNOSIS — E785 Hyperlipidemia, unspecified: Secondary | ICD-10-CM | POA: Diagnosis not present

## 2016-12-15 DIAGNOSIS — J301 Allergic rhinitis due to pollen: Secondary | ICD-10-CM

## 2016-12-15 DIAGNOSIS — M159 Polyosteoarthritis, unspecified: Secondary | ICD-10-CM | POA: Diagnosis not present

## 2016-12-15 DIAGNOSIS — G8929 Other chronic pain: Secondary | ICD-10-CM | POA: Diagnosis not present

## 2016-12-15 DIAGNOSIS — Z9181 History of falling: Secondary | ICD-10-CM | POA: Diagnosis not present

## 2016-12-15 DIAGNOSIS — F319 Bipolar disorder, unspecified: Secondary | ICD-10-CM | POA: Diagnosis not present

## 2016-12-15 DIAGNOSIS — M5430 Sciatica, unspecified side: Secondary | ICD-10-CM | POA: Diagnosis not present

## 2016-12-20 DIAGNOSIS — I10 Essential (primary) hypertension: Secondary | ICD-10-CM | POA: Diagnosis not present

## 2016-12-20 DIAGNOSIS — J4520 Mild intermittent asthma, uncomplicated: Secondary | ICD-10-CM | POA: Diagnosis not present

## 2016-12-20 DIAGNOSIS — E785 Hyperlipidemia, unspecified: Secondary | ICD-10-CM | POA: Diagnosis not present

## 2016-12-20 DIAGNOSIS — M159 Polyosteoarthritis, unspecified: Secondary | ICD-10-CM | POA: Diagnosis not present

## 2016-12-20 DIAGNOSIS — M5430 Sciatica, unspecified side: Secondary | ICD-10-CM | POA: Diagnosis not present

## 2016-12-20 DIAGNOSIS — Z9181 History of falling: Secondary | ICD-10-CM | POA: Diagnosis not present

## 2016-12-20 DIAGNOSIS — G8929 Other chronic pain: Secondary | ICD-10-CM | POA: Diagnosis not present

## 2016-12-20 DIAGNOSIS — M797 Fibromyalgia: Secondary | ICD-10-CM | POA: Diagnosis not present

## 2016-12-20 DIAGNOSIS — Z7951 Long term (current) use of inhaled steroids: Secondary | ICD-10-CM | POA: Diagnosis not present

## 2016-12-20 DIAGNOSIS — K219 Gastro-esophageal reflux disease without esophagitis: Secondary | ICD-10-CM | POA: Diagnosis not present

## 2016-12-22 DIAGNOSIS — M797 Fibromyalgia: Secondary | ICD-10-CM | POA: Diagnosis not present

## 2016-12-22 DIAGNOSIS — E785 Hyperlipidemia, unspecified: Secondary | ICD-10-CM | POA: Diagnosis not present

## 2016-12-22 DIAGNOSIS — Z9181 History of falling: Secondary | ICD-10-CM | POA: Diagnosis not present

## 2016-12-22 DIAGNOSIS — M5430 Sciatica, unspecified side: Secondary | ICD-10-CM | POA: Diagnosis not present

## 2016-12-22 DIAGNOSIS — I10 Essential (primary) hypertension: Secondary | ICD-10-CM | POA: Diagnosis not present

## 2016-12-22 DIAGNOSIS — M159 Polyosteoarthritis, unspecified: Secondary | ICD-10-CM | POA: Diagnosis not present

## 2016-12-22 DIAGNOSIS — J4520 Mild intermittent asthma, uncomplicated: Secondary | ICD-10-CM | POA: Diagnosis not present

## 2016-12-22 DIAGNOSIS — K219 Gastro-esophageal reflux disease without esophagitis: Secondary | ICD-10-CM | POA: Diagnosis not present

## 2016-12-22 DIAGNOSIS — G8929 Other chronic pain: Secondary | ICD-10-CM | POA: Diagnosis not present

## 2016-12-22 DIAGNOSIS — Z7951 Long term (current) use of inhaled steroids: Secondary | ICD-10-CM | POA: Diagnosis not present

## 2016-12-26 DIAGNOSIS — Z9181 History of falling: Secondary | ICD-10-CM | POA: Diagnosis not present

## 2016-12-26 DIAGNOSIS — M5430 Sciatica, unspecified side: Secondary | ICD-10-CM | POA: Diagnosis not present

## 2016-12-26 DIAGNOSIS — M159 Polyosteoarthritis, unspecified: Secondary | ICD-10-CM | POA: Diagnosis not present

## 2016-12-26 DIAGNOSIS — E785 Hyperlipidemia, unspecified: Secondary | ICD-10-CM | POA: Diagnosis not present

## 2016-12-26 DIAGNOSIS — Z7951 Long term (current) use of inhaled steroids: Secondary | ICD-10-CM | POA: Diagnosis not present

## 2016-12-26 DIAGNOSIS — G8929 Other chronic pain: Secondary | ICD-10-CM | POA: Diagnosis not present

## 2016-12-26 DIAGNOSIS — K219 Gastro-esophageal reflux disease without esophagitis: Secondary | ICD-10-CM | POA: Diagnosis not present

## 2016-12-26 DIAGNOSIS — I10 Essential (primary) hypertension: Secondary | ICD-10-CM | POA: Diagnosis not present

## 2016-12-26 DIAGNOSIS — M797 Fibromyalgia: Secondary | ICD-10-CM | POA: Diagnosis not present

## 2016-12-26 DIAGNOSIS — J4520 Mild intermittent asthma, uncomplicated: Secondary | ICD-10-CM | POA: Diagnosis not present

## 2016-12-28 DIAGNOSIS — Z9181 History of falling: Secondary | ICD-10-CM | POA: Diagnosis not present

## 2016-12-28 DIAGNOSIS — E785 Hyperlipidemia, unspecified: Secondary | ICD-10-CM | POA: Diagnosis not present

## 2016-12-28 DIAGNOSIS — I10 Essential (primary) hypertension: Secondary | ICD-10-CM | POA: Diagnosis not present

## 2016-12-28 DIAGNOSIS — G8929 Other chronic pain: Secondary | ICD-10-CM | POA: Diagnosis not present

## 2016-12-28 DIAGNOSIS — M5430 Sciatica, unspecified side: Secondary | ICD-10-CM | POA: Diagnosis not present

## 2016-12-28 DIAGNOSIS — K219 Gastro-esophageal reflux disease without esophagitis: Secondary | ICD-10-CM | POA: Diagnosis not present

## 2016-12-28 DIAGNOSIS — Z7951 Long term (current) use of inhaled steroids: Secondary | ICD-10-CM | POA: Diagnosis not present

## 2016-12-28 DIAGNOSIS — M797 Fibromyalgia: Secondary | ICD-10-CM | POA: Diagnosis not present

## 2016-12-28 DIAGNOSIS — M159 Polyosteoarthritis, unspecified: Secondary | ICD-10-CM | POA: Diagnosis not present

## 2016-12-28 DIAGNOSIS — J4520 Mild intermittent asthma, uncomplicated: Secondary | ICD-10-CM | POA: Diagnosis not present

## 2017-01-06 ENCOUNTER — Other Ambulatory Visit: Payer: Self-pay | Admitting: Nurse Practitioner

## 2017-01-06 DIAGNOSIS — F339 Major depressive disorder, recurrent, unspecified: Secondary | ICD-10-CM

## 2017-01-06 DIAGNOSIS — F319 Bipolar disorder, unspecified: Secondary | ICD-10-CM

## 2017-01-17 ENCOUNTER — Other Ambulatory Visit: Payer: Self-pay

## 2017-01-17 DIAGNOSIS — C50312 Malignant neoplasm of lower-inner quadrant of left female breast: Secondary | ICD-10-CM

## 2017-01-17 NOTE — Telephone Encounter (Signed)
Pt called stating she never received a call informing her if Caitlyn Bautista would except her.  I gave her the response from [facility] and she said no no no I do not want to see Caitlyn Bautista, I got more accomplished with Caitlyn Bautista than I did Caitlyn Bautista. She stated that [facility] listened to me and I know my medical conditions will not improve but the physical therapy and occupational therapy Caitlyn Bautista got me helped a lot.  I offered for her to see Caitlyn Bautista and she said no I dont understand why im being singled out because of my health conditions, if it were another person she would treat them  Please advise patient asked me to ask again if you will except as a transfer and she will come to the [place] location.

## 2017-01-19 ENCOUNTER — Telehealth: Payer: Self-pay | Admitting: *Deleted

## 2017-01-19 NOTE — Telephone Encounter (Signed)
1. "Could you ask Dr. Lindi Adie to call the [facility] about the digital mammogram he wants me to have to on my breast.  I called 01-17-2017 to schedule,  but told it requires prior authorization.  My  Insurance does not require Prior authorization for a mammogram."   Per Epic, the collaborative order for MM Diagnostic TOMO Bilateral has been signed by Dr. Lindi Adie, 01-17-2017 at 1719.  [facility] now has order authorizing to schedule a diagnostic verses normal screening mammogram.  Confirmed her communication was earlier than receipt of signed order.  Denies further needs or questions at this time.

## 2017-01-23 ENCOUNTER — Ambulatory Visit: Payer: Medicare Other | Admitting: Adult Health

## 2017-01-23 ENCOUNTER — Encounter: Payer: Self-pay | Admitting: Adult Health

## 2017-01-23 VITALS — BP 146/82 | Temp 98.9°F | Wt 303.0 lb

## 2017-01-23 DIAGNOSIS — K047 Periapical abscess without sinus: Secondary | ICD-10-CM | POA: Diagnosis not present

## 2017-01-23 MED ORDER — PENICILLIN V POTASSIUM 500 MG PO TABS: 500.0000 mg | ORAL_TABLET | Freq: Four times a day (QID) | ORAL | 0 refills | Status: AC

## 2017-01-23 NOTE — [place] Notes (Signed)
Subjective:    Patient ID: Caitlyn Bautista, female    DOB: [birth year], 66 y.o.   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 She presents to the office today for the acute complaint of swelling and pain on the left side of her face. This has been apparent for the last two days. Reports trouble with chewing. She has poor dentition from Lithium use but is limited on dental visit due to financial reasons. She has had an abscessed tooth in the past and feels as though the pain is the same   Review of Systems See HPI   Past Medical History:  Diagnosis Date  . Allergy   . Anemia   . Anxiety   . Arachnoiditis   . Arthritis   . Asthma   . Bipolar disorder ([place])   . Cancer of lower-inner quadrant of female breast ([place]) 12/05/2014  . Carpal tunnel syndrome   . Carpal tunnel syndrome, bilateral   . Cervical herniated disc   . Depression    bipolar depression  . Fibromyalgia   . GERD (gastroesophageal reflux disease)   . History of bronchitis   . History of chicken pox   . History of pneumonia 1996  . Hyperlipidemia    pt. denies  . Hypertension   . Incontinence of urine in female   . PTSD (post-traumatic stress disorder)   . Scoliosis   . Sleep apnea    does not use her CPAP  . Thyroid disease    "very mild"  . Urinary frequency   . Urinary urgency     Social History   Socioeconomic History  . Marital  status: Single    Spouse name: Not on file  . Number of children: 4  . Years of education: 81  . Highest education level: Not on file  Social Needs  . Financial resource strain: Not on file  . Food insecurity - worry: Not on file  . Food insecurity - inability: Not on file  . Transportation needs - medical: Not on file  . Transportation needs - non-medical: Not on file  Occupational History  . Occupation: Disabled    Employer: DISABLED  Tobacco Use  . Smoking status: Never Smoker  . Smokeless tobacco: Never Used  Substance and Sexual Activity  . Alcohol use: Yes    Comment: socially  . Drug use: No  . Sexual activity: No  Other Topics Concern  . Not on file  Social History Narrative   Regular exercise-no   Caffeine Use-yes    Past Surgical History:  Procedure Laterality Date  . ABDOMINAL HYSTERECTOMY  1990's  . BREAST LUMPECTOMY  1970's   right  . BREAST REDUCTION Left 01/28/2015   Performed by Wallace Going, DO at [facility]    . DILATION AND CURETTAGE OF UTERUS    . LEFT BREAST LUMPECTOMY WITH NEEDLE LOCALIZATION Left 01/28/2015   Performed by Jovita Kussmaul, MD at [facility]  (BREAST) FOR SYMMETRY Right 10/22/2015   Performed  by Wallace Going, DO at [facility]  . TUBAL LIGATION      Family History  Problem Relation Age of Onset  . Cancer Mother        unspecified type; dx. 30 or younger  . Ovarian cancer Sister 91  . Breast cancer Maternal Aunt        bilateral - dx. 30s, 47s  . Ovarian cancer Maternal Grandmother 100  . Ovarian cancer Sister 52  . Cervical cancer Sister 48  . Pancreatic cancer Maternal Uncle        dx. 50s-60s  . Cancer Cousin        either ovarian or cervical cancer  . Cancer Daughter        hx of TAH-BSO due to fibroids and bleeding  . Arthritis Other        Parent  . Hypertension Other        Parent  . Arthritis Other        Grandparent  . Hypertension Other         Grandparent  . Arthritis Other        Other Blood Relative  . Hypertension Other        Other Blood Relative  . Diabetes Other        Other Blood Relative    Allergies  Allergen Reactions  . Ace Inhibitors Other (See Comments) and Nausea And Vomiting    Cough, asthma attack  . Other     allergic to dust mites; grass and various others  . Decongestant [Pseudoephedrine Hcl] Other (See Comments) and Rash    High blood pressure  . Nsaids Swelling and Rash    Anaprox    Current Outpatient Medications on File Prior to Visit  Medication Sig Dispense Refill  . albuterol (PROAIR HFA) 108 (90 Base) MCG/ACT inhaler Inhale 1 puff into the lungs every 4 (four) hours as needed for wheezing or shortness of breath. 1 each 0  . amLODipine (NORVASC) 10 MG tablet Take 1 tablet (10 mg total) by mouth daily. 90 tablet 0  . buPROPion (WELLBUTRIN XL) 150 MG 24 hr tablet TAKE 1 TABLET BY MOUTH ONCE DAILY 90 tablet 1  . diazepam (VALIUM) 5 MG tablet Take 1 tablet (5 mg total) by mouth 3 (three) times daily as needed for anxiety. 30 tablet 0  . DULoxetine (CYMBALTA) 30 MG capsule Take 1 capsule (30 mg total) by mouth 2 (two) times daily. 180 capsule 0  . fexofenadine (ALLEGRA) 180 MG tablet Take 1 tablet (180 mg total) by mouth daily. For seasonal allergies.    . fluticasone (FLONASE) 50 MCG/ACT nasal spray Place 2 sprays into both nostrils daily. For seasonal allergies. 16 g 6  . fluticasone furoate-vilanterol (BREO ELLIPTA) 100-25 MCG/INH AEPB Inhale 1 puff into the lungs daily. 1 each 2  . Ginger, Zingiber officinalis, (GINGER PO) Take by mouth.    Marland Kitchen HYDROcodone-acetaminophen (NORCO) 10-325 MG per tablet Take 1 tablet by mouth every 8 (eight) hours as needed. (Patient not taking: Reported on 09/09/2016) 90 tablet 0  . lithium carbonate 300 MG capsule Take 1 capsule (300 mg total) by mouth 2 (two) times daily with a meal. 180 capsule 0  . mineral oil liquid Take 15 mLs by mouth daily as needed for moderate  constipation.    . montelukast (SINGULAIR) 10 MG tablet Take 1 tablet (10 mg total) by mouth at bedtime. 90 tablet 0  . non-metallic deodorant (ALRA) MISC Apply 1  application topically daily as needed. Reported on 06/19/2015    . ranitidine (ZANTAC) 75 MG tablet Take 1 tablet (75 mg total) by mouth 2 (two) times daily.    . tamoxifen (NOLVADEX) 20 MG tablet Take 1 tablet (20 mg total) by mouth daily. 90 tablet 3  . Turmeric Curcumin 500 MG CAPS Take by mouth.     No current facility-administered medications on file prior to visit.     BP (!) 146/82 (BP Location: Left Wrist)   Temp 98.9 F (37.2 C) (Oral)   Wt (!) 303 lb (137.4 kg)   BMI 47.46 kg/m       Objective:   Physical Exam  Constitutional: She is oriented to person, place, and time. She appears well-developed and well-nourished. No distress.  HENT:  Mouth/Throat: Abnormal dentition. Dental abscesses and dental caries present.    Cardiovascular: Normal rate, regular rhythm, normal heart sounds and intact distal pulses. Exam reveals no gallop and no friction rub.  No murmur heard. Pulmonary/Chest: Effort normal and breath sounds normal. No respiratory distress. She has no wheezes. She has no rales. She exhibits no tenderness.  Neurological: She is alert and oriented to person, place, and time.  Skin: Skin is warm and dry. No rash noted. She is not diaphoretic. No erythema. No pallor.  Psychiatric: She has a normal mood and affect. Her behavior is normal. Judgment and thought content normal.  Nursing note and vitals reviewed.     Assessment & Plan:  1. Tooth abscess - penicillin v potassium (VEETID) 500 MG tablet; Take 1 tablet (500 mg total) 4 (four) times daily for 10 days by mouth.  Dispense: 40 tablet; Refill: 0 - Information on low cost dentists given to the patient  - Follow up with dentist or PCP as needed  Dorothyann Peng, NP

## 2017-01-24 ENCOUNTER — Telehealth: Payer: Self-pay

## 2017-01-24 DIAGNOSIS — C50312 Malignant neoplasm of lower-inner quadrant of left female breast: Secondary | ICD-10-CM

## 2017-01-24 MED ORDER — TAMOXIFEN CITRATE 20 MG PO TABS: 20.0000 mg | ORAL_TABLET | Freq: Every day | ORAL | 2 refills | Status: DC

## 2017-01-24 NOTE — Telephone Encounter (Signed)
[facility] called requesting a prescription for tamoxifen. Verified with pt she is changing to [facility]. RX sent to start in January. She recently get refill of tamoxifen at [facility].

## 2017-01-25 ENCOUNTER — Other Ambulatory Visit: Payer: Self-pay

## 2017-01-25 DIAGNOSIS — C50312 Malignant neoplasm of lower-inner quadrant of left female breast: Secondary | ICD-10-CM

## 2017-01-25 MED ORDER — TAMOXIFEN CITRATE 20 MG PO TABS: 20.0000 mg | ORAL_TABLET | Freq: Every day | ORAL | 2 refills | Status: DC

## 2017-02-01 ENCOUNTER — Other Ambulatory Visit: Payer: Self-pay | Admitting: Adult Health

## 2017-02-01 DIAGNOSIS — K047 Periapical abscess without sinus: Secondary | ICD-10-CM

## 2017-02-01 NOTE — Telephone Encounter (Signed)
Denied.  Pt needs to follow up with her PCP if she needs refill/different medication.  Message sent to the pharmacy.

## 2017-02-02 ENCOUNTER — Telehealth: Payer: Self-pay | Admitting: Internal Medicine

## 2017-02-02 ENCOUNTER — Other Ambulatory Visit: Payer: Self-pay | Admitting: Adult Health

## 2017-02-02 NOTE — Telephone Encounter (Addendum)
Pt returned call and states her abscess is still there. It burst this past Monday and it is still swollen. She states she still has pus there. It still hurst ans she does not have an elevated temp. So she is requesting more penicillin to be called in for her.  She previously had a prescription for 10 days on 01/23/17 to 02/02/17.

## 2017-02-02 NOTE — Telephone Encounter (Signed)
Copied from [place]. Topic: Quick Communication - See Telephone Encounter >> Feb 02, 2017  9:23 AM Bea Graff, NT wrote: CRM for notification. See Telephone encounter for: Patient was seen last week for an abscess and part of the abscess came out on Monday morning but she has now finished the round of antibiotics and it has not gone away completely. Pt requesting a refill of penicillin or another antibiotic. Pt would like to use [facility] on [address].   02/02/17.

## 2017-02-02 NOTE — Telephone Encounter (Signed)
Called patient to check on her abscess and why she needed more antibiotic. Left message for her to give [place] a call back

## 2017-02-02 NOTE — Telephone Encounter (Signed)
Has she been to the dentist about this? Would need evaluation again here to see if there are signs of infection as antibiotics can have serious side effects.

## 2017-02-03 NOTE — Telephone Encounter (Signed)
We cannot call in the same thing if it did not work. She will need to be seen. We are willing to work with her but can only prescribe medications if this is a problem.

## 2017-02-03 NOTE — Telephone Encounter (Signed)
Can you please call and schedule patient an appointment. Thank you

## 2017-02-03 NOTE — Telephone Encounter (Signed)
LM asking patient to call back to schedule an appointment.

## 2017-02-03 NOTE — Telephone Encounter (Signed)
Patient states that she called the clinic in [place] 4 times, was told she would get a call back and they did not. And has been calling back ever since and not getting anyone. States that she saw Tommi Rumps recently for this and was given antibiotics and is upset that she is not getting treated with what she needs and does not understand why you cant work with her. States she tried getting in here to see you but was told the office was booked so she went to another building for her jaw. And she is trying to do her part but she cannot run around to different offices because of her health and income.

## 2017-02-09 ENCOUNTER — Ambulatory Visit
Admission: RE | Admit: 2017-02-09 | Discharge: 2017-02-09 | Disposition: A | Discharge status: Home / Self Care | Payer: Medicare Other | Source: Ambulatory Visit | Attending: Hematology and Oncology | Admitting: Hematology and Oncology

## 2017-02-09 DIAGNOSIS — R928 Other abnormal and inconclusive findings on diagnostic imaging of breast: Secondary | ICD-10-CM | POA: Diagnosis not present

## 2017-02-09 DIAGNOSIS — C50312 Malignant neoplasm of lower-inner quadrant of left female breast: Secondary | ICD-10-CM

## 2017-02-09 HISTORY — DX: Personal history of irradiation: Z92.3
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
## 2017-03-02 ENCOUNTER — Ambulatory Visit (HOSPITAL_COMMUNITY): Payer: Medicare Other | Admitting: Psychiatry

## 2017-03-02 ENCOUNTER — Encounter (HOSPITAL_COMMUNITY): Payer: Self-pay | Admitting: Psychiatry

## 2017-03-02 VITALS — BP 139/85 | HR 86 | Ht 67.0 in | Wt 303.0 lb

## 2017-03-02 DIAGNOSIS — G4700 Insomnia, unspecified: Secondary | ICD-10-CM

## 2017-03-02 DIAGNOSIS — F4310 Post-traumatic stress disorder, unspecified: Secondary | ICD-10-CM | POA: Diagnosis not present

## 2017-03-02 DIAGNOSIS — Z818 Family history of other mental and behavioral disorders: Secondary | ICD-10-CM | POA: Diagnosis not present

## 2017-03-02 DIAGNOSIS — F0630 Mood disorder due to known physiological condition, unspecified: Secondary | ICD-10-CM

## 2017-03-02 DIAGNOSIS — F3170 Bipolar disorder, currently in remission, most recent episode unspecified: Secondary | ICD-10-CM | POA: Diagnosis not present

## 2017-03-02 DIAGNOSIS — M797 Fibromyalgia: Secondary | ICD-10-CM | POA: Diagnosis not present

## 2017-03-02 DIAGNOSIS — Z853 Personal history of malignant neoplasm of breast: Secondary | ICD-10-CM | POA: Diagnosis not present

## 2017-03-02 DIAGNOSIS — R450 Nervousness: Secondary | ICD-10-CM

## 2017-03-02 DIAGNOSIS — F419 Anxiety disorder, unspecified: Secondary | ICD-10-CM | POA: Diagnosis not present

## 2017-03-02 DIAGNOSIS — F410 Panic disorder [episodic paroxysmal anxiety] without agoraphobia: Secondary | ICD-10-CM

## 2017-03-02 MED ORDER — DULOXETINE HCL 30 MG PO CPEP: 30.0000 mg | ORAL_CAPSULE | Freq: Two times a day (BID) | ORAL | 0 refills | Status: DC

## 2017-03-02 MED ORDER — BUPROPION HCL ER (XL) 150 MG PO TB24: 150.0000 mg | ORAL_TABLET | Freq: Every day | ORAL | 0 refills | Status: DC

## 2017-03-02 MED ORDER — HYDROXYZINE PAMOATE 25 MG PO CAPS: 25.0000 mg | ORAL_CAPSULE | Freq: Every day | ORAL | 0 refills | Status: DC | PRN

## 2017-03-02 MED ORDER — LITHIUM CARBONATE ER 300 MG PO TBCR: 300.0000 mg | EXTENDED_RELEASE_TABLET | Freq: Two times a day (BID) | ORAL | 0 refills | Status: DC

## 2017-03-02 NOTE — Patient Instructions (Signed)
1. Continue Lithium 300 mg twice a day  2. Continue Duloxetine 30 mg twice a day  3. Continue bupropion 150 mg daily  4. Start hydroxyzine 25 mg twice a day as needed for anxiety  5. Return to clinic in one month for 30 mins

## 2017-03-03 LAB — COMPREHENSIVE METABOLIC PANEL
AG Ratio: 1.1 (calc) (ref 1.0–2.5)
ALBUMIN MSPROF: 3.7 g/dL (ref 3.6–5.1)
ALKALINE PHOSPHATASE (APISO): 79 U/L (ref 33–130)
ALT: 25 U/L (ref 6–29)
AST: 16 U/L (ref 10–35)
BILIRUBIN TOTAL: 0.2 mg/dL (ref 0.2–1.2)
BUN: 16 mg/dL (ref 7–25)
CALCIUM: 9.3 mg/dL (ref 8.6–10.4)
CHLORIDE: 108 mmol/L (ref 98–110)
CO2: 26 mmol/L (ref 20–32)
Creat: 0.81 mg/dL (ref 0.50–0.99)
GLOBULIN: 3.5 g/dL (ref 1.9–3.7)
Glucose, Bld: 79 mg/dL (ref 65–139)
POTASSIUM: 3.9 mmol/L (ref 3.5–5.3)
Sodium: 141 mmol/L (ref 135–146)
Total Protein: 7.2 g/dL (ref 6.1–8.1)

## 2017-03-03 LAB — LITHIUM LEVEL: Lithium Lvl: <0.3 mmol/L — ABNORMAL LOW (ref 0.6–1.2)

## 2017-03-03 LAB — TSH: TSH: 3.25 mIU/L (ref 0.40–4.50)

## 2017-03-08 ENCOUNTER — Telehealth (HOSPITAL_COMMUNITY): Payer: Self-pay | Admitting: Psychiatry

## 2017-03-08 NOTE — Telephone Encounter (Signed)
Received a notification from [employer]. Drug interaction with tamoxifen and bupropion. Patient has been on this regimen at least for several months without significant side effect. Will continue current meds at this time.

## 2017-03-13 ENCOUNTER — Telehealth (HOSPITAL_COMMUNITY): Payer: Self-pay | Admitting: *Deleted

## 2017-03-13 NOTE — Telephone Encounter (Signed)
Please contact the pharmacy that patient has been already on that medication for a while before I see her and thus I continued the same dose of that medication. Will discus more at the next visit. Will not change medication at this time.  Please also contact the patient to have follow up appointment.

## 2017-03-13 NOTE — Telephone Encounter (Signed)
Dr Modesta Messing, [facility] called with a drug interaction notification on the Bupropion & the Tamoxifen which can cause   recurring breast cancer. Pharmacist stated would like to increase the Bupropion & decrease the Tamoxifen.

## 2017-03-13 NOTE — Telephone Encounter (Signed)
Notified [facility] per Dr Modesta Messing: Please contact the pharmacy that patient has been already on that medication for a while before I see her and thus I continued the same dose of that medication

## 2017-03-21 ENCOUNTER — Ambulatory Visit: Payer: Self-pay | Admitting: Internal Medicine

## 2017-03-28 ENCOUNTER — Ambulatory Visit: Payer: Medicare Other | Admitting: Internal Medicine

## 2017-03-28 ENCOUNTER — Encounter: Payer: Self-pay | Admitting: Internal Medicine

## 2017-03-28 DIAGNOSIS — J301 Allergic rhinitis due to pollen: Secondary | ICD-10-CM | POA: Diagnosis not present

## 2017-03-28 DIAGNOSIS — J309 Allergic rhinitis, unspecified: Secondary | ICD-10-CM

## 2017-03-28 DIAGNOSIS — J4520 Mild intermittent asthma, uncomplicated: Secondary | ICD-10-CM | POA: Diagnosis not present

## 2017-03-28 DIAGNOSIS — K219 Gastro-esophageal reflux disease without esophagitis: Secondary | ICD-10-CM | POA: Diagnosis not present

## 2017-03-28 DIAGNOSIS — I10 Essential (primary) hypertension: Secondary | ICD-10-CM | POA: Diagnosis not present

## 2017-03-28 MED ORDER — MONTELUKAST SODIUM 10 MG PO TABS: 10.0000 mg | ORAL_TABLET | Freq: Every day | ORAL | 3 refills | Status: DC

## 2017-03-28 MED ORDER — PANTOPRAZOLE SODIUM 40 MG PO TBEC: 40.0000 mg | DELAYED_RELEASE_TABLET | Freq: Every day | ORAL | 3 refills | Status: DC

## 2017-03-28 MED ORDER — FLUTICASONE PROPIONATE 50 MCG/ACT NA SUSP: 2.0000 | Freq: Every day | NASAL | 3 refills | Status: DC

## 2017-03-28 MED ORDER — AMLODIPINE BESYLATE 10 MG PO TABS: 10.0000 mg | ORAL_TABLET | Freq: Every day | ORAL | 3 refills | Status: DC

## 2017-03-28 MED ORDER — DOXYCYCLINE HYCLATE 100 MG PO TABS: 100.0000 mg | ORAL_TABLET | Freq: Two times a day (BID) | ORAL | 0 refills | Status: DC

## 2017-03-28 MED ORDER — ALBUTEROL SULFATE HFA 108 (90 BASE) MCG/ACT IN AERS: 1.0000 | INHALATION_SPRAY | RESPIRATORY_TRACT | 6 refills | Status: DC | PRN

## 2017-03-28 MED ORDER — AMLODIPINE BESYLATE 10 MG PO TABS: 10.0000 mg | ORAL_TABLET | Freq: Every day | ORAL | 0 refills | Status: DC

## 2017-03-28 MED ORDER — FLUTICASONE FUROATE-VILANTEROL 100-25 MCG/INH IN AEPB: 1.0000 | INHALATION_SPRAY | Freq: Every day | RESPIRATORY_TRACT | 3 refills | Status: DC

## 2017-03-28 NOTE — Progress Notes (Signed)
   Subjective:    Patient ID: Caitlyn Bautista, female    DOB: 09-05-1950, 67 y.o.   MRN: [id number]  HPI The patient is a 67 YO female coming in for several concerns including GERD (taking tums over the counter very often and having a lot of problems with GERD, pain in her stomach with eating which is changing her diet, denies vomiting or blood in stool) and her asthma (out of breo and albuterol for some time, uses them only rarely when she is having problems, has been needing this more lately and has not had, denies fevers or chills, coughing more for the last 3-3 weeks and some wheezing) and her blood pressure (out of amlodipine for some time now, denies headaches or chest pains or SOB, is not sure why she let it run out without calling for refills).   Review of Systems  Constitutional: Positive for appetite change. Negative for activity change, chills, fatigue and fever.  HENT: Positive for congestion and postnasal drip.   Eyes: Negative.   Respiratory: Positive for cough, shortness of breath and wheezing. Negative for chest tightness.   Cardiovascular: Negative for chest pain, palpitations and leg swelling.  Gastrointestinal: Positive for abdominal pain. Negative for abdominal distention, constipation, diarrhea, nausea and vomiting.       GERD  Musculoskeletal: Negative.   Skin: Negative.   Neurological: Negative.   Psychiatric/Behavioral: Negative.       Objective:   Physical Exam  Constitutional: She is oriented to person, place, and time. She appears well-developed and well-nourished.  HENT:  Head: Normocephalic and atraumatic.  Eyes: EOM are normal.  Neck: Normal range of motion.  Cardiovascular: Normal rate and regular rhythm.  Pulmonary/Chest: Effort normal. No respiratory distress. She has wheezes. She has no rales.  Some wheezing which partially clears with cough  Abdominal: Soft. Bowel sounds are normal. She exhibits no distension. There is tenderness. There is no rebound.    Mild epigastric tenderness.   Musculoskeletal: She exhibits no edema.  Neurological: She is alert and oriented to person, place, and time. Coordination normal.  Skin: Skin is warm and dry.  Psychiatric: She has a normal mood and affect.   Vitals:   03/28/17 1100 03/28/17 1137  BP: (!) 160/100 (!) 144/99  Pulse: 92   Temp: 98.6 F (37 C)   TempSrc: Oral   SpO2: 98%   Weight: (!) 303 lb (137.4 kg)   Height: 5\' 7"  (1.702 m)       Assessment & Plan:

## 2017-03-28 NOTE — Assessment & Plan Note (Signed)
Needs good control to avoid asthma flare. Rx for protonix daily to help symptoms. Call back if not improved.

## 2017-03-28 NOTE — Assessment & Plan Note (Signed)
Rx for breo, albuterol, doxycycline given 3-4 weeks of symptoms and some wheezing on exam. Would like to avoid prednisone if able given her severe weight problems.

## 2017-03-28 NOTE — Assessment & Plan Note (Signed)
Refill amlodipine today and encouraged her to call office before running out due to serious problems with high blood pressure. BP previously at goal on medication. Checking CMP today.

## 2017-03-28 NOTE — Patient Instructions (Signed)
We have sent in all the refills today.  We have sent in a new medicine for the stomach called protonix (pantoprazole) to take 1 pill daily for the heartburn.   We have sent in an antibiotic and the blood pressure to the [facility]. The antibiotic is called doxycycline and you should take 1 pill twice a day for 1 week.
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
## 2017-04-12 ENCOUNTER — Ambulatory Visit (HOSPITAL_COMMUNITY): Payer: Self-pay | Admitting: Psychiatry

## 2017-04-20 ENCOUNTER — Other Ambulatory Visit (HOSPITAL_COMMUNITY): Payer: Self-pay | Admitting: Psychiatry

## 2017-04-20 MED ORDER — BUPROPION HCL ER (XL) 150 MG PO TB24: 150.0000 mg | ORAL_TABLET | Freq: Every day | ORAL | 0 refills | Status: DC
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
## 2017-05-15 ENCOUNTER — Ambulatory Visit (INDEPENDENT_AMBULATORY_CARE_PROVIDER_SITE_OTHER): Payer: Medicare Other | Admitting: Psychiatry

## 2017-05-15 ENCOUNTER — Encounter (HOSPITAL_COMMUNITY): Payer: Self-pay | Admitting: Psychiatry

## 2017-05-15 VITALS — BP 137/85 | HR 112 | Ht 67.0 in | Wt 305.0 lb

## 2017-05-15 DIAGNOSIS — G8929 Other chronic pain: Secondary | ICD-10-CM

## 2017-05-15 DIAGNOSIS — Z915 Personal history of self-harm: Secondary | ICD-10-CM

## 2017-05-15 DIAGNOSIS — Z91410 Personal history of adult physical and sexual abuse: Secondary | ICD-10-CM

## 2017-05-15 DIAGNOSIS — F419 Anxiety disorder, unspecified: Secondary | ICD-10-CM

## 2017-05-15 DIAGNOSIS — Z853 Personal history of malignant neoplasm of breast: Secondary | ICD-10-CM

## 2017-05-15 DIAGNOSIS — F3170 Bipolar disorder, currently in remission, most recent episode unspecified: Secondary | ICD-10-CM

## 2017-05-15 DIAGNOSIS — G4700 Insomnia, unspecified: Secondary | ICD-10-CM | POA: Diagnosis not present

## 2017-05-15 DIAGNOSIS — Z736 Limitation of activities due to disability: Secondary | ICD-10-CM | POA: Diagnosis not present

## 2017-05-15 DIAGNOSIS — Z923 Personal history of irradiation: Secondary | ICD-10-CM

## 2017-05-15 DIAGNOSIS — R45851 Suicidal ideations: Secondary | ICD-10-CM | POA: Diagnosis not present

## 2017-05-15 DIAGNOSIS — F4310 Post-traumatic stress disorder, unspecified: Secondary | ICD-10-CM

## 2017-05-15 DIAGNOSIS — Z560 Unemployment, unspecified: Secondary | ICD-10-CM

## 2017-05-15 DIAGNOSIS — Z818 Family history of other mental and behavioral disorders: Secondary | ICD-10-CM

## 2017-05-15 DIAGNOSIS — R450 Nervousness: Secondary | ICD-10-CM

## 2017-05-15 MED ORDER — LITHIUM CARBONATE ER 300 MG PO TBCR: 300.0000 mg | EXTENDED_RELEASE_TABLET | Freq: Two times a day (BID) | ORAL | 0 refills | Status: DC

## 2017-05-15 MED ORDER — ESCITALOPRAM OXALATE 10 MG PO TABS: ORAL_TABLET | ORAL | 1 refills | Status: DC

## 2017-05-15 MED ORDER — BUPROPION HCL ER (XL) 150 MG PO TB24: 150.0000 mg | ORAL_TABLET | Freq: Every day | ORAL | 0 refills | Status: DC

## 2017-05-15 NOTE — Patient Instructions (Signed)
1. Continue Lithium 300 mg twice a day  2. Decrease duloxetine 30 mg daily for one week, then discontinue  3. Start lexapro 5 mg daily for one week, then 10 mg daily  4. Continue bupropion 150 mg daily  5. Continue hydroxyzine 25 mg twice a day as needed for anxiety  5. Return to clinic in six weeks for 30 mins 6. Obtain blood test (lithium, TSH, BMP) 7. Obtain record from her previous psychiatrist 8. Obtain EKG at your primary care visit

## 2017-06-06 DIAGNOSIS — K219 Gastro-esophageal reflux disease without esophagitis: Secondary | ICD-10-CM | POA: Diagnosis not present

## 2017-06-06 DIAGNOSIS — D649 Anemia, unspecified: Secondary | ICD-10-CM | POA: Diagnosis not present

## 2017-06-06 DIAGNOSIS — Z79899 Other long term (current) drug therapy: Secondary | ICD-10-CM | POA: Diagnosis not present

## 2017-06-06 DIAGNOSIS — M1990 Unspecified osteoarthritis, unspecified site: Secondary | ICD-10-CM | POA: Diagnosis not present

## 2017-06-06 DIAGNOSIS — Z853 Personal history of malignant neoplasm of breast: Secondary | ICD-10-CM | POA: Diagnosis not present

## 2017-06-06 DIAGNOSIS — I10 Essential (primary) hypertension: Secondary | ICD-10-CM | POA: Diagnosis not present

## 2017-06-12 ENCOUNTER — Ambulatory Visit (INDEPENDENT_AMBULATORY_CARE_PROVIDER_SITE_OTHER): Payer: Medicare Other

## 2017-06-12 ENCOUNTER — Ambulatory Visit (INDEPENDENT_AMBULATORY_CARE_PROVIDER_SITE_OTHER): Payer: Medicare Other | Admitting: Orthopaedic Surgery

## 2017-06-12 ENCOUNTER — Encounter (INDEPENDENT_AMBULATORY_CARE_PROVIDER_SITE_OTHER): Payer: Self-pay | Admitting: Orthopaedic Surgery

## 2017-06-12 DIAGNOSIS — M545 Low back pain: Secondary | ICD-10-CM | POA: Diagnosis not present

## 2017-06-12 DIAGNOSIS — M25561 Pain in right knee: Secondary | ICD-10-CM | POA: Diagnosis not present

## 2017-06-12 DIAGNOSIS — M25562 Pain in left knee: Secondary | ICD-10-CM | POA: Diagnosis not present

## 2017-06-12 DIAGNOSIS — G8929 Other chronic pain: Secondary | ICD-10-CM | POA: Diagnosis not present

## 2017-06-12 DIAGNOSIS — E6609 Other obesity due to excess calories: Secondary | ICD-10-CM | POA: Diagnosis not present

## 2017-06-12 MED ORDER — LIDOCAINE HCL 1 % IJ SOLN
2.0000 mL | INTRAMUSCULAR | Status: AC | PRN
  Administered 2017-06-12: 2 mL

## 2017-06-12 MED ORDER — BUPIVACAINE HCL 0.5 % IJ SOLN
2.0000 mL | INTRAMUSCULAR | Status: AC | PRN
  Administered 2017-06-12: 2 mL via INTRA_ARTICULAR

## 2017-06-12 MED ORDER — DICLOFENAC SODIUM 1 % TD GEL: 2.0000 g | Freq: Four times a day (QID) | TRANSDERMAL | 5 refills | Status: DC

## 2017-06-12 MED ORDER — METHYLPREDNISOLONE ACETATE 40 MG/ML IJ SUSP
40.0000 mg | INTRAMUSCULAR | Status: AC | PRN
  Administered 2017-06-12: 40 mg via INTRA_ARTICULAR

## 2017-06-12 NOTE — Progress Notes (Signed)
Office Visit Note   Patient: Caitlyn Bautista           Date of Birth: [birth year]           MRN: [id number] Visit Date: 06/12/2017              Requested by: Hoyt Koch, MD [place], [place] [id number] PCP: Hoyt Koch, MD   Assessment & Plan: Visit Diagnoses:  1. Chronic bilateral low back pain without sciatica   2. Chronic pain of right knee   3. Chronic pain of left knee   4. Obesity due to excess calories, unspecified classification, unspecified whether serious comorbidity present     Plan: Impression is bilateral knee osteoarthritis and lumbar spondylosis and morbid obesity.  Bilateral knee cortisone injections were performed today.  We stressed the importance of diet and exercise and weight loss.  Physical therapy referral was made today.  Prescription for Voltaren gel.  Follow-up as needed.  Follow-Up Instructions: Return if symptoms worsen or fail to improve.   Orders:  Orders Placed This Encounter  Procedures  . XR Lumbar Spine 2-3 Views  . XR Knee Complete 4 Views Left  . Amb Ref to Medical Weight Management   Meds ordered this encounter  Medications  . diclofenac sodium (VOLTAREN) 1 % GEL    Sig: Apply 2 g topically 4 (four) times daily.    Dispense:  1 Tube    Refill:  5      Procedures: Large Joint Inj: bilateral knee on 06/12/2017 3:39 PM Indications: pain Details: 22 G needle  Arthrogram: No  Medications (Right): 2 mL lidocaine 1 %; 2 mL bupivacaine 0.5 %; 40 mg methylPREDNISolone acetate 40 MG/ML Medications (Left): 2 mL lidocaine 1 %; 2 mL bupivacaine 0.5 %; 40 mg methylPREDNISolone acetate 40 MG/ML Outcome: tolerated well, no immediate complications Patient was prepped and draped in the usual sterile fashion.       Clinical Data: No additional findings.   Subjective: Chief Complaint  Patient presents with  . Lower Back - Pain  . Right Knee - Pain  . Left Knee - Pain    Patient is a 67 year old female  who comes in with bilateral knee pain for 6-7 years and back pain without radicular symptoms.  She states that she gained weight after getting radiation for breast cancer.  She states she walks with a Rollator.  She denies any radicular symptoms.   Review of Systems  Constitutional: Negative.   HENT: Negative.   Eyes: Negative.   Respiratory: Negative.   Cardiovascular: Negative.   Endocrine: Negative.   Musculoskeletal: Negative.   Neurological: Negative.   Hematological: Negative.   Psychiatric/Behavioral: Negative.   All other systems reviewed and are negative.    Objective: Vital Signs: There were no vitals taken for this visit.  Physical Exam  Constitutional: She is oriented to person, place, and time. She appears well-developed and well-nourished.  HENT:  Head: Normocephalic and atraumatic.  Eyes: EOM are normal.  Neck: Neck supple.  Pulmonary/Chest: Effort normal.  Abdominal: Soft.  Neurological: She is alert and oriented to person, place, and time.  Skin: Skin is warm. Capillary refill takes less than 2 seconds.  Psychiatric: She has a normal mood and affect. Her behavior is normal. Judgment and thought content normal.  Nursing note and vitals reviewed.   Ortho Exam Bilateral knee exam shows no joint effusion.  Collaterals and cruciates are stable.  1+ patellofemoral crepitus.  Low back  exam shows no focal deficits.  Normal reflexes.  Negative sciatic tension.  Specialty Comments:  No specialty comments available.  Imaging: Xr Knee Complete 4 Views Left  Result Date: 06/12/2017 Tricompartmental degenerative joint disease  Xr Lumbar Spine 2-3 Views  Result Date: 06/12/2017 Lumbar spondylosis.  Anterior lumbar osteophytes    PMFS History: Patient Active Problem List   Diagnosis Date Noted  . Bipolar affective disorder in remission ([place]) 03/02/2017  . PTSD (post-traumatic stress disorder) 03/02/2017  . Mood disorder in conditions classified elsewhere  03/02/2017  . Asthma 06/24/2016  . Genetic testing 01/02/2015  . Family history of breast cancer in female 12/11/2014  . Family history of ovarian cancer 12/11/2014  . Primary cancer of lower-inner quadrant of left female breast ([place]) 12/05/2014  . Back pain 04/11/2014  . MDD (major depressive disorder) 06/24/2013  . GERD (gastroesophageal reflux disease) 06/24/2013  . Bipolar 1 disorder, depressed ([place]) 06/13/2013  . Severe obesity (BMI >= 40) ([place]) 10/11/2012  . Hyperlipidemia 10/11/2012  . Allergic rhinitis 10/11/2012  . Fibromyalgia 10/11/2012  . Sciatica neuralgia 10/11/2012  . Essential hypertension 08/28/2012   Past Medical History:  Diagnosis Date  . Allergy   . Anemia   . Anxiety   . Arachnoiditis   . Arthritis   . Asthma   . Bipolar disorder ([place])   . Cancer of lower-inner quadrant of female breast ([place]) 12/05/2014  . Carpal tunnel syndrome   . Carpal tunnel syndrome, bilateral   . Cervical herniated disc   . Depression    bipolar depression  . Fibromyalgia   . GERD (gastroesophageal reflux disease)   . History of bronchitis   . History of chicken pox   . History of pneumonia 1996  . Hyperlipidemia    pt. denies  . Hypertension   . Incontinence of urine in female   . Personal history of radiation therapy   . PTSD (post-traumatic stress disorder)   . Scoliosis   . Sleep apnea    does not use her CPAP  . Thyroid disease    "very mild"  . Urinary frequency   . Urinary urgency     Family History  Problem Relation Age of Onset  . Cancer Mother        unspecified type; dx. 15 or younger  . Ovarian cancer Sister 80  . Breast cancer Maternal Aunt        bilateral - dx. 30s, 39s  . Ovarian cancer Maternal Grandmother 100  . Ovarian cancer Sister 65  . Cervical cancer Sister 74  . Pancreatic cancer Maternal Uncle        dx. 50s-60s  . Cancer Cousin        either ovarian or cervical cancer  . Cancer Daughter        hx of TAH-BSO due to fibroids and  bleeding  . Bipolar disorder Daughter   . Arthritis Other        Parent  . Hypertension Other        Parent  . Arthritis Other        Grandparent  . Hypertension Other        Grandparent  . Arthritis Other        Other Blood Relative  . Hypertension Other        Other Blood Relative  . Diabetes Other        Other Blood Relative    Past Surgical History:  Procedure Laterality Date  . ABDOMINAL HYSTERECTOMY  1990's  . BREAST LUMPECTOMY  1970's   right  . BREAST LUMPECTOMY Left 01/28/2015  . BREAST LUMPECTOMY WITH NEEDLE LOCALIZATION Left 01/28/2015   Procedure: LEFT BREAST LUMPECTOMY WITH NEEDLE LOCALIZATION;  Surgeon: [name], MD;  Location: [facility];  Service: General;  Laterality: Left;  . BREAST REDUCTION SURGERY Left 01/28/2015   Procedure: BREAST REDUCTION;  Surgeon: Loel Lofty Dillingham, DO;  Location: [facility];  Service: Plastics;  Laterality: Left;  . BREAST REDUCTION SURGERY Right 10/22/2015   Procedure: RIGHT MAMMARY REDUCTION  (BREAST) FOR SYMMETRY;  Surgeon: Wallace Going, DO;  Location: [facility];  Service: Plastics;  Laterality: Right;  . COLONOSCOPY    . DILATION AND CURETTAGE OF UTERUS    . REDUCTION MAMMAPLASTY Bilateral 2017  . TUBAL LIGATION     Social History   Occupational History  . Occupation: Disabled    Employer: DISABLED  Tobacco Use  . Smoking status: Never Smoker  . Smokeless tobacco: Never Used  Substance and Sexual Activity  . Alcohol use: Yes    Comment: socially  . Drug use: No  . Sexual activity: Never

## 2017-06-12 NOTE — Addendum Note (Signed)
Addended by: Brand Males E on: 06/12/2017 04:05 PM   Modules accepted: Orders

## 2017-06-20 DIAGNOSIS — R8290 Unspecified abnormal findings in urine: Secondary | ICD-10-CM | POA: Diagnosis not present

## 2017-06-20 DIAGNOSIS — R7989 Other specified abnormal findings of blood chemistry: Secondary | ICD-10-CM | POA: Diagnosis not present

## 2017-06-20 DIAGNOSIS — I10 Essential (primary) hypertension: Secondary | ICD-10-CM | POA: Diagnosis not present

## 2017-06-20 DIAGNOSIS — C50912 Malignant neoplasm of unspecified site of left female breast: Secondary | ICD-10-CM | POA: Diagnosis not present

## 2017-07-03 ENCOUNTER — Other Ambulatory Visit (HOSPITAL_COMMUNITY): Payer: Self-pay | Admitting: Psychiatry

## 2017-07-03 ENCOUNTER — Ambulatory Visit (HOSPITAL_COMMUNITY): Payer: Medicare Other | Admitting: Psychiatry

## 2017-07-11 DIAGNOSIS — Z1159 Encounter for screening for other viral diseases: Secondary | ICD-10-CM | POA: Diagnosis not present

## 2017-07-11 DIAGNOSIS — D509 Iron deficiency anemia, unspecified: Secondary | ICD-10-CM | POA: Diagnosis not present

## 2017-07-11 DIAGNOSIS — Z7689 Persons encountering health services in other specified circumstances: Secondary | ICD-10-CM | POA: Diagnosis not present

## 2017-07-11 DIAGNOSIS — R319 Hematuria, unspecified: Secondary | ICD-10-CM | POA: Diagnosis not present
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
## 2017-07-26 ENCOUNTER — Encounter (HOSPITAL_COMMUNITY): Payer: Self-pay | Admitting: Psychiatry

## 2017-07-26 ENCOUNTER — Ambulatory Visit (HOSPITAL_COMMUNITY): Payer: Medicare Other | Admitting: Psychiatry

## 2017-07-26 VITALS — BP 162/84 | HR 104 | Ht 67.0 in | Wt 310.0 lb

## 2017-07-26 DIAGNOSIS — G4700 Insomnia, unspecified: Secondary | ICD-10-CM

## 2017-07-26 DIAGNOSIS — Z736 Limitation of activities due to disability: Secondary | ICD-10-CM

## 2017-07-26 DIAGNOSIS — F4310 Post-traumatic stress disorder, unspecified: Secondary | ICD-10-CM | POA: Diagnosis not present

## 2017-07-26 DIAGNOSIS — F0630 Mood disorder due to known physiological condition, unspecified: Secondary | ICD-10-CM | POA: Diagnosis not present

## 2017-07-26 DIAGNOSIS — F515 Nightmare disorder: Secondary | ICD-10-CM

## 2017-07-26 DIAGNOSIS — R450 Nervousness: Secondary | ICD-10-CM

## 2017-07-26 DIAGNOSIS — F419 Anxiety disorder, unspecified: Secondary | ICD-10-CM

## 2017-07-26 DIAGNOSIS — Z818 Family history of other mental and behavioral disorders: Secondary | ICD-10-CM

## 2017-07-26 MED ORDER — LITHIUM CARBONATE ER 300 MG PO TBCR: 300.0000 mg | EXTENDED_RELEASE_TABLET | Freq: Two times a day (BID) | ORAL | 0 refills | Status: DC

## 2017-07-26 MED ORDER — ESCITALOPRAM OXALATE 10 MG PO TABS: ORAL_TABLET | ORAL | 0 refills | Status: DC

## 2017-07-26 MED ORDER — BUPROPION HCL ER (XL) 150 MG PO TB24: 150.0000 mg | ORAL_TABLET | Freq: Every day | ORAL | 0 refills | Status: DC

## 2017-07-26 MED ORDER — HYDROXYZINE PAMOATE 25 MG PO CAPS: 25.0000 mg | ORAL_CAPSULE | Freq: Every day | ORAL | 0 refills | Status: DC | PRN

## 2017-07-26 NOTE — Patient Instructions (Signed)
1. Continue Lithium 300 mg twice a day  2. Continue lexapro 10 mg daily  3. Continue bupropion 150 mg daily  4. Continue hydroxyzine 25 mg twice a day as needed for anxiety  5.Return to clinic in three months for 30 mins

## 2017-08-03 DIAGNOSIS — R350 Frequency of micturition: Secondary | ICD-10-CM | POA: Diagnosis not present

## 2017-08-08 ENCOUNTER — Encounter (INDEPENDENT_AMBULATORY_CARE_PROVIDER_SITE_OTHER): Payer: Self-pay

## 2017-08-16 IMAGING — MG MM DIAG BREAST TOMO UNI LEFT
6 series · 6 of 14 positions shown · non-contrast
Comparison: Previous exam(s).

CLINICAL DATA: Patient recalled from screening for left breast
asymmetry and calcifications.

EXAM:
DIGITAL DIAGNOSTIC LEFT MAMMOGRAM WITH 3D TOMOSYNTHESIS AND CAD

[L MLO]
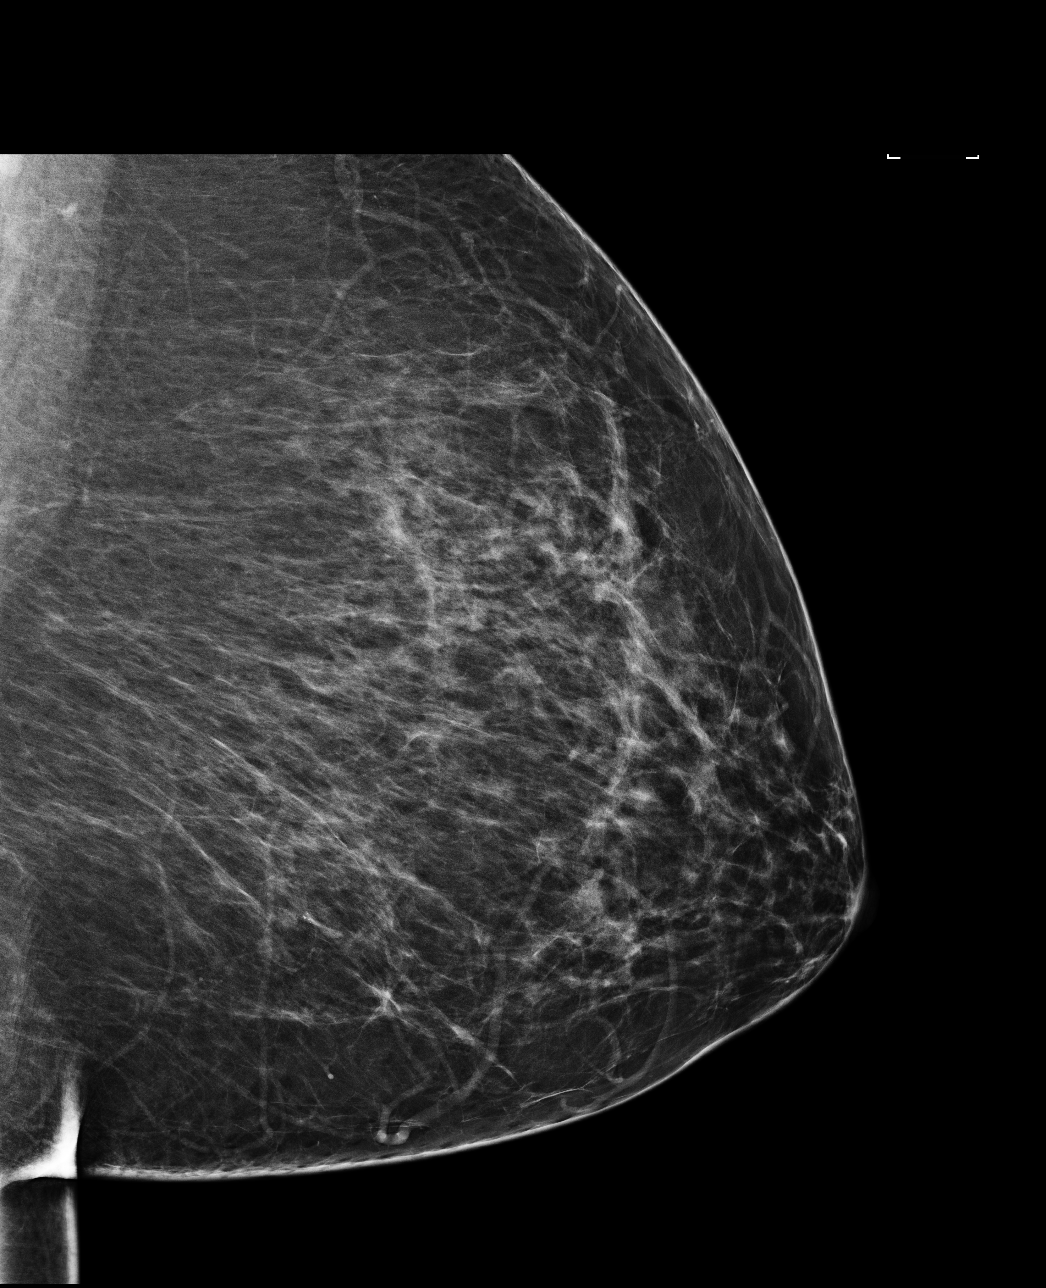

[L CC]
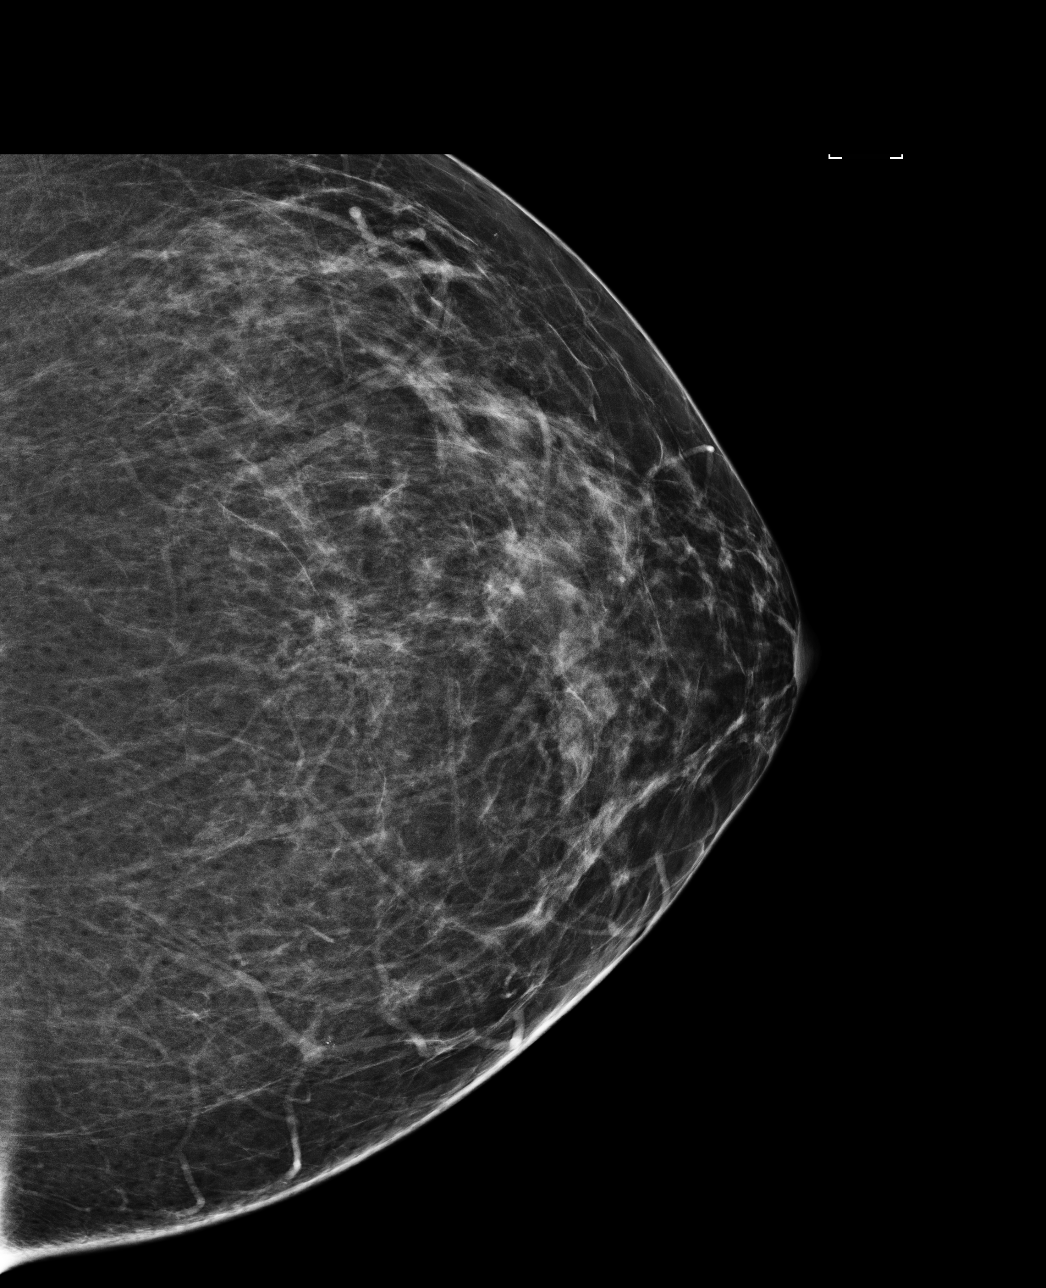

[L CC tomo (1 of 2)]
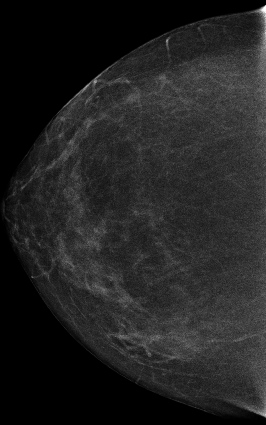

[L MLO tomo (1 of 2)]
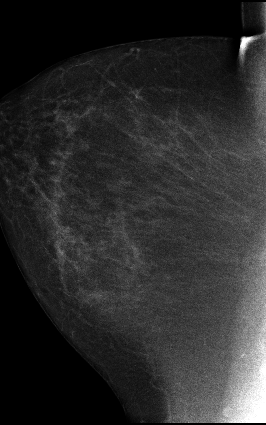

[L MLO tomo (2 of 2) · tomo slice 47/93.0]
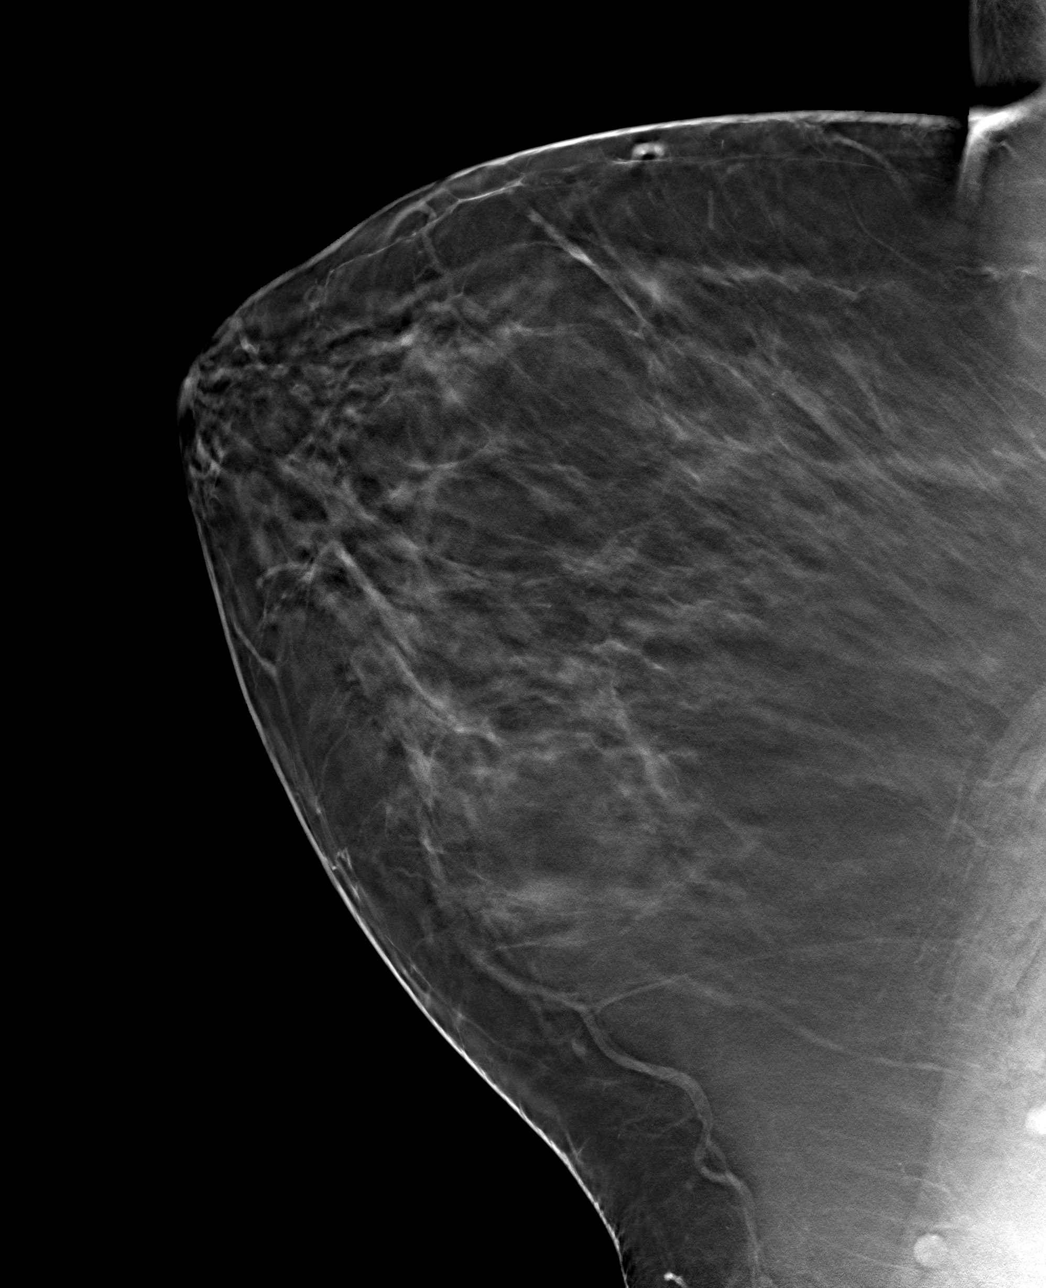

[L CC tomo (2 of 2) · tomo slice 41/81.0]
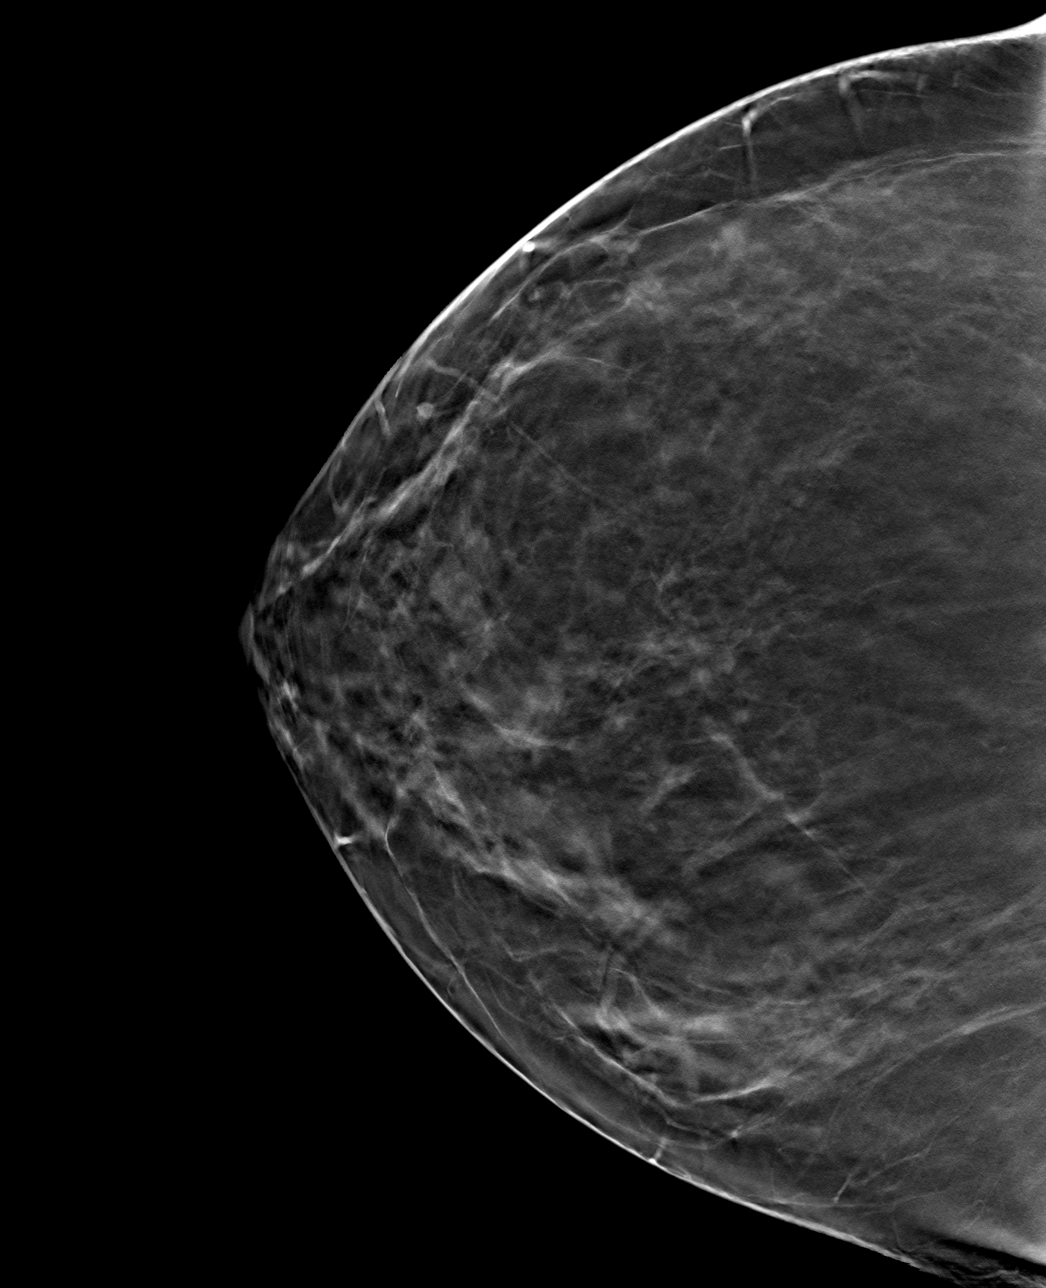

[6 of 14 positions shown; findings below may reference images not displayed]

ACR Breast Density Category b: There are scattered areas of
fibroglandular density.
FINDINGS: Magnification and tomosynthesis images of the left breast were
obtained. These images demonstrate a new 5 mm group of coarse
heterogeneous calcifications within the lower inner left breast
middle depth.

Mammographic images were processed with CAD.
IMPRESSION: Suspicious left breast calcifications.

RECOMMENDATION:
Stereotactic guided core needle biopsy left breast calcifications.

Biopsy will be scheduled at patient's convenience.

I have discussed the findings and recommendations with the patient.
Results were also provided in writing at the conclusion of the
visit. If applicable, a reminder letter will be sent to the patient
regarding the next appointment.

BI-RADS CATEGORY  4: Suspicious.

## 2017-10-10 ENCOUNTER — Inpatient Hospital Stay: Payer: Medicare Other | Attending: Hematology and Oncology | Admitting: Hematology and Oncology

## 2017-10-10 NOTE — Assessment & Plan Note (Deleted)
left lumpectomy 01/28/2015: DCIS 1.8 cm, margins negative, closest margin 0.3 cm, ALH,fibrocystic changes with calcifications, Tis N0 stage 0, ER 100%, PR 100% low to intermediate grade Completed adjuvant radiation therapy 04/08/2015 to 05/18/2015 (Dr.Squire)  Current treatment: Antiestrogen therapy with tamoxifen 5 years started May 2017  Tamoxifen toxicities: 1. Intermittent hot flashes  Breast cancer surveillance: 1.  Breast exam 10/10/2017: Patient had breast reduction surgery on the right side and left mammoplasty 10/22/2015: Benign fibrocystic changes with focal lobular hyperplasia. 2. Mammogram 02/09/2017: Breast density category B  Return to clinic in 1 year for follow-up
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
## 2017-10-26 ENCOUNTER — Ambulatory Visit (HOSPITAL_COMMUNITY): Payer: Self-pay | Admitting: Psychiatry

## 2017-10-27 DIAGNOSIS — K0889 Other specified disorders of teeth and supporting structures: Secondary | ICD-10-CM | POA: Diagnosis not present

## 2017-10-27 DIAGNOSIS — K029 Dental caries, unspecified: Secondary | ICD-10-CM | POA: Diagnosis not present

## 2017-10-27 DIAGNOSIS — Z79899 Other long term (current) drug therapy: Secondary | ICD-10-CM | POA: Diagnosis not present

## 2017-11-08 ENCOUNTER — Other Ambulatory Visit: Payer: Self-pay

## 2017-11-08 DIAGNOSIS — C50312 Malignant neoplasm of lower-inner quadrant of left female breast: Secondary | ICD-10-CM

## 2017-11-08 MED ORDER — TAMOXIFEN CITRATE 20 MG PO TABS: 20.0000 mg | ORAL_TABLET | Freq: Every day | ORAL | 2 refills | Status: DC

## 2017-12-11 DIAGNOSIS — K006 Disturbances in tooth eruption: Secondary | ICD-10-CM | POA: Diagnosis not present

## 2017-12-14 DIAGNOSIS — K006 Disturbances in tooth eruption: Secondary | ICD-10-CM | POA: Diagnosis not present

## 2017-12-14 DIAGNOSIS — K0381 Cracked tooth: Secondary | ICD-10-CM | POA: Diagnosis not present

## 2017-12-14 DIAGNOSIS — K027 Dental root caries: Secondary | ICD-10-CM | POA: Diagnosis not present

## 2018-01-10 DIAGNOSIS — D509 Iron deficiency anemia, unspecified: Secondary | ICD-10-CM | POA: Diagnosis not present

## 2018-01-10 DIAGNOSIS — C50912 Malignant neoplasm of unspecified site of left female breast: Secondary | ICD-10-CM | POA: Diagnosis not present

## 2018-01-26 ENCOUNTER — Telehealth: Payer: Self-pay | Admitting: Hematology and Oncology

## 2018-01-26 NOTE — Telephone Encounter (Signed)
Called both number listed for patient per 11/21 sch message- unable to reach patient or leave message to set up f/u and labs.

## 2018-01-31 ENCOUNTER — Encounter: Payer: Self-pay | Admitting: Internal Medicine

## 2018-02-07 ENCOUNTER — Telehealth: Payer: Self-pay | Admitting: Hematology and Oncology

## 2018-02-07 NOTE — Telephone Encounter (Signed)
Scheduled appt per 1/23 sch message - pt is aware of appt date and time  

## 2018-02-12 ENCOUNTER — Inpatient Hospital Stay: Payer: Medicare Other | Admitting: Hematology and Oncology

## 2018-02-12 NOTE — Assessment & Plan Note (Deleted)
left lumpectomy 01/28/2015: DCIS 1.8 cm, margins negative, closest margin 0.3 cm, ALH,fibrocystic changes with calcifications, Tis N0 stage 0, ER 100%, PR 100% low to intermediate grade Completed adjuvant radiation therapy 04/08/2015 to 05/18/2015 (Dr.Squire)  Current treatment: Antiestrogen therapy with tamoxifen 5 years started May 2017  Tamoxifen toxicities: 1. Intermittent hot flashes  Breast cancer surveillance: 1.  Mammogram 02/09/2017: Benign breast density category B 2.  Breast exam 02/12/2018: Benign  Patient had brest reduction surgery on the right side and left mammoplasty 10/22/2015: Benign fibrocystic changes with focal lobular hyperplasia.  Return to clinic in 1 year for follow-up
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
## 2018-02-20 ENCOUNTER — Ambulatory Visit (INDEPENDENT_AMBULATORY_CARE_PROVIDER_SITE_OTHER): Payer: Medicare Other | Admitting: Psychiatry

## 2018-02-20 ENCOUNTER — Encounter (HOSPITAL_COMMUNITY): Payer: Self-pay | Admitting: Psychiatry

## 2018-02-20 VITALS — BP 145/89 | HR 94 | Ht 67.0 in | Wt 319.0 lb

## 2018-02-20 DIAGNOSIS — F0630 Mood disorder due to known physiological condition, unspecified: Secondary | ICD-10-CM

## 2018-02-20 MED ORDER — HYDROXYZINE PAMOATE 25 MG PO CAPS: 25.0000 mg | ORAL_CAPSULE | Freq: Every day | ORAL | 0 refills | Status: DC | PRN

## 2018-02-20 MED ORDER — LITHIUM CARBONATE ER 300 MG PO TBCR: 300.0000 mg | EXTENDED_RELEASE_TABLET | Freq: Two times a day (BID) | ORAL | 0 refills | Status: DC

## 2018-02-20 MED ORDER — ESCITALOPRAM OXALATE 10 MG PO TABS: ORAL_TABLET | ORAL | 0 refills | Status: DC

## 2018-02-20 NOTE — Patient Instructions (Signed)
1. Continue Lithium 300 mg twice a day 2. Reinitiate lexapro 5 mg daily for one week, then 10 mg daily  4. Continuehydroxyzine 25 mg twice a day as needed for anxiety  5.Return to clinic intwo months for 15 mons

## 2018-02-21 NOTE — Progress Notes (Signed)
Patient Care Team: Patient, [name] as PCP - General (General Practice) Jovita Kussmaul, MD as Consulting Physician (General Surgery) Nicholas Lose, MD as Consulting Physician (Hematology and Oncology) Arloa Koh, MD as Consulting Physician (Radiation Oncology) Mauro Kaufmann, RN as Registered Nurse Rockwell Germany, RN as Registered Nurse Jake Shark Johny Blamer, NP as Nurse Practitioner (Nurse Practitioner)  DIAGNOSIS:    ICD-10-CM   1. Primary cancer of lower-inner quadrant of left female breast ([place]) C50.312 MM DIAG BREAST TOMO BILATERAL  2. 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    01/28/2015 Surgery    Left lumpectomy: DCIS 1.8 cm, margins negative, closest margin 0.3 cm, ALH,fibrocystic changes with calcifications, ER+ 100%, PR+ 100% low to intermediate grade    01/28/2015 Pathologic Stage    Stage 0: Tis Nx    04/08/2015 - 05/18/2015 Radiation Therapy    Adjuvant radiation therapy (Dr.Squire): Left breast / 50.4 Gy in 28 fractions    07/14/2015 -  Anti-estrogen oral therapy    Tamoxifen 20 mg daily to begin ~07/14/2015. Planned duration of therapy 5 years.    08/05/2015 Survivorship    Survivorship care plan mailed to patient in lieu of in person visit     CHIEF COMPLIANT: Follow-up of left breast DCIS on tamoxifen therapy  INTERVAL HISTORY: Caitlyn Bautista is a 67 y.o. with  above-mentioned history of left breast DCIS underwent lumpectomy followed by radiation therapy. She underwent mammoplasty surgery in 10/2015. She is currently on anti-estrogen therapy with Tamoxifen. I last saw the patient 1.5 years ago. Her most recent mammogram on 02/09/17 shows no evidence of malignancy in either breast. She presents to the clinic today with lab work suggestive of iron deficiency anemia. Her biggest complaint today is profound weight gain of over 60 pounds since she finished with radiation. She is using a wheelchair because of multiple problems including osteoarthritis and disc problems. She says that she is trying to curb her eating but has not been losing weight.  She feels depressed as result of this.  REVIEW OF SYSTEMS:   Constitutional: Denies fevers, chills or abnormal weight loss Eyes: Denies blurriness of vision Ears, nose, mouth, throat, and face: Denies mucositis or sore throat Respiratory: Denies cough, dyspnea or wheezes Cardiovascular: Denies palpitation, chest discomfort Gastrointestinal:  Denies nausea, heartburn or change in bowel habits Skin: Denies abnormal skin rashes Lymphatics: Denies new lymphadenopathy or easy bruising Neurological:Denies numbness, tingling or new weaknesses Behavioral/Psych: Mood is stable, no new changes  Extremities: No lower extremity edema Breast:  denies any pain or lumps or nodules in either breasts All other systems were reviewed with the patient and are negative.  I have reviewed the past medical history, past surgical history, social history and family history with the patient and they are unchanged from previous note.  ALLERGIES:  is allergic to ace inhibitors; other; decongestant [pseudoephedrine  hcl]; and nsaids.  MEDICATIONS:  Current Outpatient Medications  Medication Sig Dispense Refill  . albuterol (PROAIR HFA) 108 (90 Base) MCG/ACT inhaler Inhale 1 puff into the lungs every 4 (four) hours as needed for wheezing or  shortness of breath. 3 each 6  . amLODipine (NORVASC) 10 MG tablet Take 1 tablet (10 mg total) by mouth daily. 14 tablet 0  . diclofenac sodium (VOLTAREN) 1 % GEL Apply 2 g topically 4 (four) times daily. 1 Tube 5  . doxycycline (VIBRA-TABS) 100 MG tablet Take 1 tablet (100 mg total) by mouth 2 (two) times daily. 14 tablet 0  . escitalopram (LEXAPRO) 10 MG tablet Start lexapro 5 mg daily for one week, then 10 mg daily 90 tablet 0  . escitalopram (LEXAPRO) 10 MG tablet Start 5 mg daily for one week, then 10 mg daily 90 tablet 0  . fexofenadine (ALLEGRA) 180 MG tablet Take 1 tablet (180 mg total) by mouth daily. For seasonal allergies.    . fluticasone (FLONASE) 50 MCG/ACT nasal spray Place 2 sprays into both nostrils daily. For seasonal allergies. 48 g 3  . fluticasone furoate-vilanterol (BREO ELLIPTA) 100-25 MCG/INH AEPB Inhale 1 puff into the lungs daily. 3 each 3  . Ginger, Zingiber officinalis, (GINGER PO) Take by mouth.    . hydrOXYzine (VISTARIL) 25 MG capsule Take 1 capsule (25 mg total) by mouth daily as needed for anxiety (can take up to twice a day as needed). 90 capsule 0  . lithium carbonate (LITHOBID) 300 MG CR tablet Take 1 tablet (300 mg total) by mouth 2 (two) times daily. 180 tablet 0  . mineral oil liquid Take 15 mLs by mouth daily as needed for moderate constipation.    . montelukast (SINGULAIR) 10 MG tablet Take 1 tablet (10 mg total) by mouth at bedtime. 90 tablet 3  . non-metallic deodorant (ALRA) MISC Apply 1 application topically daily as needed. Reported on 06/19/2015    . pantoprazole (PROTONIX) 40 MG tablet Take 1 tablet (40 mg total) by mouth daily. 90 tablet 3  . Turmeric Curcumin 500 MG CAPS Take by mouth.     No current facility-administered medications for this visit.     PHYSICAL EXAMINATION: ECOG PERFORMANCE STATUS: 1 - Symptomatic but completely ambulatory  Vitals:   02/22/18 1238  BP: (!) 146/84  Pulse: 92  Resp: 18  Temp: 99 F (37.2 C)  SpO2: 99%    Filed Weights   02/22/18 1238  Weight: (!) 321 lb 4.8 oz (145.7 kg)    GENERAL:alert, no distress and comfortable SKIN: skin color, texture, turgor are normal, no rashes or significant lesions EYES: normal, Conjunctiva are pink and non-injected, sclera clear OROPHARYNX:no exudate, no erythema and lips, buccal mucosa, and tongue normal  NECK: supple, thyroid normal size, non-tender, without nodularity LYMPH:  no palpable lymphadenopathy in the cervical, axillary or inguinal LUNGS: clear to auscultation and percussion with normal breathing effort HEART: regular rate & rhythm and no murmurs and no lower extremity edema ABDOMEN:abdomen soft, non-tender and normal bowel sounds MUSCULOSKELETAL:no cyanosis of digits and no clubbing  NEURO: alert & oriented x 3 with fluent speech, no focal motor/sensory deficits EXTREMITIES: No lower extremity edema BREAST: No palpable masses or nodules in either right or left breasts. No palpable axillary supraclavicular or infraclavicular adenopathy no breast tenderness or nipple discharge. (exam performed in the presence of a chaperone)  LABORATORY DATA:  I have reviewed the data as listed CMP Latest Ref Rng & Units 03/02/2017 06/23/2016  01/21/2015  Glucose 65 - 139 mg/dL 79 105(H) 84  BUN 7 - 25 mg/dL _0 Creatinine 0.50 - 0.99 mg/dL 0.81 0.77 0.92  Sodium 135 - 146 mmol/L 141 142 140  Potassium 3.5 - 5.3 mmol/L 3.9 3.2(L) 4.2  Chloride 98 - 110 mmol/L 108 108 107  CO2 20 - 32 mmol/L _1 Calcium 8.6 - 10.4 mg/dL 9.3 8.9 9.4  Total Protein 6.1 - 8.1 g/dL 7.2 7.7 -  Total Bilirubin 0.2 - 1.2 mg/dL 0.2 0.3 -  Alkaline Phos 39 - 117 U/L - 96 -  AST 10 - 35 U/L 16 12 -  ALT 6 - 29 U/L 25 13 -    Lab Results  Component Value Date   WBC 9.0 06/23/2016   HGB 10.9 (L) 06/23/2016   HCT 34.3 (L) 06/23/2016   MCV 84.1 06/23/2016   PLT 393.0 06/23/2016   NEUTROABS 2.9 12/10/2014    ASSESSMENT & PLAN:  Primary cancer of lower-inner  quadrant of left female breast ([place]) left lumpectomy 01/28/2015: DCIS 1.8 cm, margins negative, closest margin 0.3 cm, ALH,fibrocystic changes with calcifications, Tis N0 stage 0, ER 100%, PR 100% low to intermediate grade Completed adjuvant radiation therapy 04/08/2015 to 05/18/2015 (Dr.Squire)  Current treatment: Antiestrogen therapy with tamoxifen 5 years started May 2017  Tamoxifen toxicities: 1. Intermittent hot flashes  Patient had brest reduction surgery on the right side and left mammoplasty 10/22/2015: Benign fibrocystic changes with focal lobular hyperplasia.  Breast cancer surveillance: 1.  Breast exam 02/22/2018: Benign 2.  Mammogram needs to be scheduled  Return to clinic in 1 year for follow-up  Iron deficiency anemia Labs at [facility] shows iron def Ferritin is 73 elevated due to inflammation Iron saturation 6% Plan: 2 doses of IV Iron 1 week apart RTC in 3 months with labs    Orders Placed This Encounter  Procedures  . MM DIAG BREAST TOMO BILATERAL    Standing Status:   Future    Standing Expiration Date:   02/23/2019    Order Specific Question:   Reason for Exam (SYMPTOM  OR DIAGNOSIS REQUIRED)    Answer:   Annual mammogram for DCIS    Order Specific Question:   Preferred imaging location?    Answer:   [facility]   The patient has a good understanding of the overall plan. she agrees with it. she will call with any problems that may develop before the next visit here.  Nicholas Lose, MD 02/22/2018   I, Cloyde Reams Dorshimer, am acting as scribe for Nicholas Lose, MD.  I have reviewed the above documentation for accuracy and completeness, and I agree with the above.

## 2018-02-22 ENCOUNTER — Inpatient Hospital Stay: Payer: Medicare Other | Attending: Hematology and Oncology | Admitting: Hematology and Oncology

## 2018-02-22 ENCOUNTER — Telehealth: Payer: Self-pay | Admitting: Hematology and Oncology

## 2018-02-22 DIAGNOSIS — D509 Iron deficiency anemia, unspecified: Secondary | ICD-10-CM | POA: Diagnosis not present

## 2018-02-22 DIAGNOSIS — D0512 Intraductal carcinoma in situ of left breast: Secondary | ICD-10-CM | POA: Diagnosis not present

## 2018-02-22 DIAGNOSIS — C50312 Malignant neoplasm of lower-inner quadrant of left female breast: Secondary | ICD-10-CM

## 2018-02-22 DIAGNOSIS — R635 Abnormal weight gain: Secondary | ICD-10-CM | POA: Insufficient documentation

## 2018-02-22 DIAGNOSIS — Z170 Estrogen receptor positive status [ER+]: Secondary | ICD-10-CM | POA: Insufficient documentation

## 2018-02-22 NOTE — Assessment & Plan Note (Signed)
left lumpectomy 01/28/2015: DCIS 1.8 cm, margins negative, closest margin 0.3 cm, ALH,fibrocystic changes with calcifications, Tis N0 stage 0, ER 100%, PR 100% low to intermediate grade Completed adjuvant radiation therapy 04/08/2015 to 05/18/2015 (Dr.Squire)  Current treatment: Antiestrogen therapy with tamoxifen 5 years started May 2017  Tamoxifen toxicities: 1. Intermittent hot flashes  Patient had brest reduction surgery on the right side and left mammoplasty 10/22/2015: Benign fibrocystic changes with focal lobular hyperplasia.  Breast cancer surveillance: 1.  Breast exam 02/22/2018: Benign 2.  Mammogram needs to be scheduled  Return to clinic in 1 year for follow-up

## 2018-02-22 NOTE — Telephone Encounter (Signed)
Gave patient avs and calendar.  Made appts according to patient's schedule.

## 2018-02-22 NOTE — Assessment & Plan Note (Signed)
Labs at [facility] shows iron def Plan: 2 doses of IV Iron 1 week apart RTC in 3 months with labs

## 2018-03-01 ENCOUNTER — Inpatient Hospital Stay: Payer: Medicare Other

## 2018-03-01 ENCOUNTER — Other Ambulatory Visit: Payer: Self-pay | Admitting: Hematology and Oncology

## 2018-03-01 VITALS — BP 148/72 | HR 83 | Temp 98.2°F | Resp 16

## 2018-03-01 DIAGNOSIS — Z170 Estrogen receptor positive status [ER+]: Secondary | ICD-10-CM | POA: Diagnosis not present

## 2018-03-01 DIAGNOSIS — D509 Iron deficiency anemia, unspecified: Secondary | ICD-10-CM

## 2018-03-01 DIAGNOSIS — D0512 Intraductal carcinoma in situ of left breast: Secondary | ICD-10-CM | POA: Diagnosis not present

## 2018-03-01 MED ORDER — SODIUM CHLORIDE 0.9 % IV SOLN
510.0000 mg | Freq: Once | INTRAVENOUS | Status: AC
  Administered 2018-03-01: 510 mg via INTRAVENOUS
  Filled 2018-03-01: qty 17

## 2018-03-01 MED ORDER — SODIUM CHLORIDE 0.9 % IV SOLN
Freq: Once | INTRAVENOUS | Status: DC
  Filled 2018-03-01: qty 250

## 2018-03-01 NOTE — Patient Instructions (Signed)

## 2018-03-14 ENCOUNTER — Telehealth: Payer: Self-pay | Admitting: Hematology and Oncology

## 2018-03-14 ENCOUNTER — Inpatient Hospital Stay: Payer: Medicare Other

## 2018-03-14 NOTE — Telephone Encounter (Signed)
R/s appt per 1/8 sch message - pt is aware of appt date and time   

## 2018-03-21 ENCOUNTER — Inpatient Hospital Stay: Payer: Medicare Other | Attending: Hematology and Oncology

## 2018-03-21 ENCOUNTER — Other Ambulatory Visit: Payer: Self-pay

## 2018-03-21 ENCOUNTER — Other Ambulatory Visit: Payer: Self-pay | Admitting: Hematology and Oncology

## 2018-03-21 VITALS — BP 134/83 | HR 85 | Temp 99.0°F | Resp 17

## 2018-03-21 DIAGNOSIS — D509 Iron deficiency anemia, unspecified: Secondary | ICD-10-CM | POA: Diagnosis not present

## 2018-03-21 DIAGNOSIS — D0512 Intraductal carcinoma in situ of left breast: Secondary | ICD-10-CM | POA: Diagnosis present

## 2018-03-21 MED ORDER — SODIUM CHLORIDE 0.9 % IV SOLN
Freq: Once | INTRAVENOUS | Status: AC
  Administered 2018-03-21: 14:00:00 via INTRAVENOUS
  Filled 2018-03-21: qty 250

## 2018-03-21 MED ORDER — SODIUM CHLORIDE 0.9 % IV SOLN
510.0000 mg | Freq: Once | INTRAVENOUS | Status: AC
  Administered 2018-03-21: 510 mg via INTRAVENOUS
  Filled 2018-03-21: qty 17

## 2018-03-21 NOTE — Patient Instructions (Signed)
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
## 2018-04-24 ENCOUNTER — Ambulatory Visit (HOSPITAL_COMMUNITY): Payer: Medicare Other | Admitting: Psychiatry

## 2018-04-26 ENCOUNTER — Other Ambulatory Visit: Payer: Self-pay | Admitting: Internal Medicine

## 2018-04-26 DIAGNOSIS — J4520 Mild intermittent asthma, uncomplicated: Secondary | ICD-10-CM

## 2018-04-26 DIAGNOSIS — I10 Essential (primary) hypertension: Secondary | ICD-10-CM

## 2018-05-16 ENCOUNTER — Other Ambulatory Visit: Payer: Self-pay

## 2018-05-16 DIAGNOSIS — D509 Iron deficiency anemia, unspecified: Secondary | ICD-10-CM

## 2018-05-18 ENCOUNTER — Other Ambulatory Visit: Payer: Self-pay

## 2018-05-23 ENCOUNTER — Ambulatory Visit: Payer: Self-pay | Admitting: Hematology and Oncology
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
## 2018-05-31 ENCOUNTER — Other Ambulatory Visit: Payer: Self-pay

## 2018-05-31 ENCOUNTER — Ambulatory Visit (INDEPENDENT_AMBULATORY_CARE_PROVIDER_SITE_OTHER): Payer: Medicare Other | Admitting: Psychiatry

## 2018-05-31 ENCOUNTER — Encounter (HOSPITAL_COMMUNITY): Payer: Self-pay | Admitting: Psychiatry

## 2018-05-31 DIAGNOSIS — F39 Unspecified mood [affective] disorder: Secondary | ICD-10-CM | POA: Diagnosis not present

## 2018-05-31 DIAGNOSIS — F4310 Post-traumatic stress disorder, unspecified: Secondary | ICD-10-CM

## 2018-05-31 DIAGNOSIS — Z79899 Other long term (current) drug therapy: Secondary | ICD-10-CM | POA: Diagnosis not present

## 2018-05-31 DIAGNOSIS — F0630 Mood disorder due to known physiological condition, unspecified: Secondary | ICD-10-CM

## 2018-05-31 MED ORDER — ESCITALOPRAM OXALATE 10 MG PO TABS: 10.0000 mg | ORAL_TABLET | Freq: Every day | ORAL | 0 refills | Status: DC

## 2018-05-31 MED ORDER — LITHIUM CARBONATE ER 300 MG PO TBCR: 300.0000 mg | EXTENDED_RELEASE_TABLET | Freq: Two times a day (BID) | ORAL | 0 refills | Status: DC

## 2018-05-31 MED ORDER — HYDROXYZINE PAMOATE 25 MG PO CAPS: 25.0000 mg | ORAL_CAPSULE | Freq: Every day | ORAL | 0 refills | Status: DC | PRN

## 2018-05-31 NOTE — Patient Instructions (Addendum)
1. Continue Lithium 300 mg twice a day 2. Continue lexapro 10 mg daily  3. Continuehydroxyzine 25 mg once to twice a day as needed for anxiety 4.Return to clinic inthree months for 15 mons 5. Obtain blood test (lithium, kidney function,thyroid) - order sent to [facility] [facility]

## 2018-06-01 ENCOUNTER — Other Ambulatory Visit: Payer: Self-pay | Admitting: Internal Medicine

## 2018-06-01 DIAGNOSIS — J301 Allergic rhinitis due to pollen: Secondary | ICD-10-CM

## 2018-06-06 DIAGNOSIS — Z1211 Encounter for screening for malignant neoplasm of colon: Secondary | ICD-10-CM | POA: Diagnosis not present

## 2018-06-06 DIAGNOSIS — C50912 Malignant neoplasm of unspecified site of left female breast: Secondary | ICD-10-CM | POA: Diagnosis not present

## 2018-06-06 DIAGNOSIS — I10 Essential (primary) hypertension: Secondary | ICD-10-CM | POA: Diagnosis not present

## 2018-06-06 DIAGNOSIS — Z119 Encounter for screening for infectious and parasitic diseases, unspecified: Secondary | ICD-10-CM | POA: Diagnosis not present

## 2018-06-12 NOTE — Assessment & Plan Note (Deleted)
December 2019: Iron saturation 6% Prior treatment: IV iron December to January 2020  Lab review:  Return to clinic in 6 months with labs and follow-up.

## 2018-06-12 NOTE — Assessment & Plan Note (Deleted)
left lumpectomy 01/28/2015: DCIS 1.8 cm, margins negative, closest margin 0.3 cm, ALH,fibrocystic changes with calcifications, Tis N0 stage 0, ER 100%, PR 100% low to intermediate grade Completed adjuvant radiation therapy 04/08/2015 to 05/18/2015 (Dr.Squire)  Current treatment: Antiestrogen therapy with tamoxifen 5 yearsstarted May 2017  Tamoxifen toxicities: 1. Intermittent hot flashes  Patient had brest reduction surgery on the right side and left mammoplasty 10/22/2015: Benign fibrocystic changes with focal lobular hyperplasia.  Breast cancer surveillance: 1.  Breast exam 02/22/2018: Benign 2.  Mammogram needs to be scheduled supposed to have been done by December 2019  Return to clinic in 1 year for follow-up

## 2018-06-15 ENCOUNTER — Inpatient Hospital Stay: Payer: Medicare Other | Attending: Hematology and Oncology

## 2018-06-21 NOTE — Progress Notes (Deleted)
Patient Care Team: Patient, [name] as PCP - General (General Practice) Jovita Kussmaul, MD as Consulting Physician (General Surgery) Nicholas Lose, MD as Consulting Physician (Hematology and Oncology) Arloa Koh, MD as Consulting Physician (Radiation Oncology) Mauro Kaufmann, RN as Registered Nurse Rockwell Germany, RN as Registered Nurse Jake Shark Johny Blamer, NP as Nurse Practitioner (Nurse Practitioner)  DIAGNOSIS:    ICD-10-CM   1. Primary cancer of lower-inner quadrant of left female breast ([place]) C50.312   2. Iron deficiency anemia, unspecified iron deficiency anemia type D50.9     SUMMARY OF ONCOLOGIC HISTORY:   Primary cancer of lower-inner quadrant of left female breast ([place])   11/26/2014 Mammogram    Left breast: 8:00 calcifications spanning 5 mm    12/02/2014 Initial Diagnosis    Left breast biopsy: Low to intermediate grade DCIS with calcifications, ER+ 100%, PR+ 100%    12/02/2014 Clinical Stage    Stage 0: Tis N0    12/11/2014 Procedure    Breast/Ovarian panel revealed no deleterious mutations at ATM, BARD1, BRCA1, BRCA2, BRIP1, CDH1, CHEK2, FANCC, MLH1, MSH2, MSH6, NBN, PALB2, PMS2, PTEN, RAD51C, RAD51D, TP53, and XRCC2.    01/28/2015 Surgery    Left lumpectomy: DCIS 1.8 cm, margins negative, closest margin 0.3 cm, ALH,fibrocystic changes with calcifications, ER+ 100%, PR+ 100% low to intermediate grade    01/28/2015 Pathologic Stage    Stage 0: Tis Nx    04/08/2015 - 05/18/2015 Radiation Therapy    Adjuvant radiation therapy (Dr.Squire): Left breast / 50.4 Gy in 28 fractions    07/14/2015 -  Anti-estrogen oral therapy    Tamoxifen 20 mg daily to begin ~07/14/2015. Planned duration of therapy 5 years.    08/05/2015 Survivorship    Survivorship care plan mailed to patient in lieu of in person visit     CHIEF COMPLIANT: Follow-up on tamoxifen therapy and iron deficiency anemia  INTERVAL HISTORY: Caitlyn Bautista is a 68 y.o. with above-mentioned history of  left breast DCIS underwent lumpectomy followed by radiation therapy and is currently on antiestrogen therapy with tamoxifen. She also has a history of iron deficiency anemia for which she previously received IV iron treatment. She presents to the clinic alone today to review labs.   REVIEW OF SYSTEMS:   Constitutional: Denies fevers, chills or abnormal weight loss Eyes: Denies blurriness of vision Ears, nose, mouth, throat, and face: Denies mucositis or sore throat Respiratory: Denies cough, dyspnea or wheezes Cardiovascular: Denies palpitation, chest discomfort Gastrointestinal: Denies nausea, heartburn or change in bowel habits Skin: Denies abnormal skin rashes Lymphatics: Denies new lymphadenopathy or easy bruising Neurological: Denies numbness, tingling or new weaknesses Behavioral/Psych: Mood is stable, no new changes  Extremities: No lower extremity edema Breast: denies any pain or lumps or nodules in either breasts All other systems were reviewed with the patient and are negative.  I have reviewed the past medical history, past surgical history, social history and family history with the patient and they are unchanged from previous note.  ALLERGIES:  is allergic to ace inhibitors; other; decongestant [pseudoephedrine hcl]; and nsaids.  MEDICATIONS:  Current Outpatient Medications  Medication Sig Dispense Refill  . amLODipine (NORVASC) 10 MG tablet Take 1 tablet (10 mg total) by mouth daily. Need annual appointment for further refills 90 tablet 0  . BREO ELLIPTA 100-25 MCG/INH AEPB USE 1 INHALATION DAILY 180 each 0  . diclofenac sodium (VOLTAREN) 1 % GEL Apply 2 g topically 4 (four) times daily. 1 Tube 5  .  doxycycline (VIBRA-TABS) 100 MG tablet Take 1 tablet (100 mg total) by mouth 2 (two) times daily. 14 tablet 0  . escitalopram (LEXAPRO) 10 MG tablet Take 1 tablet (10 mg total) by mouth daily. 90 tablet 0  . fexofenadine (ALLEGRA) 180 MG tablet Take 1 tablet (180 mg total) by  mouth daily. For seasonal allergies.    . fluticasone (FLONASE) 50 MCG/ACT nasal spray USE 2 SPRAYS INTO BOTH  NOSTRILS DAILY FOR SEASONAL ALLERGIES. 48 g 3  . Ginger, Zingiber officinalis, (GINGER PO) Take by mouth.    . hydrOXYzine (VISTARIL) 25 MG capsule Take 1 capsule (25 mg total) by mouth daily as needed for anxiety. 90 capsule 0  . lithium carbonate (LITHOBID) 300 MG CR tablet Take 1 tablet (300 mg total) by mouth 2 (two) times daily. 180 tablet 0  . mineral oil liquid Take 15 mLs by mouth daily as needed for moderate constipation.    . montelukast (SINGULAIR) 10 MG tablet Take 1 tablet (10 mg total) by mouth at bedtime. Need annual appointment for further refills 90 tablet 0  . non-metallic deodorant (ALRA) MISC Apply 1 application topically daily as needed. Reported on 06/19/2015    . pantoprazole (PROTONIX) 40 MG tablet Take 1 tablet (40 mg total) by mouth daily. Need annual appointment for further refills 90 tablet 0  . PROAIR HFA 108 (90 Base) MCG/ACT inhaler USE 1 PUFF EVERY 4 HOURS AS NEEDED FOR WHEEZING OR  SHORTNESS OF BREATH. 25.5 g 0  . Turmeric Curcumin 500 MG CAPS Take by mouth.     No current facility-administered medications for this visit.     PHYSICAL EXAMINATION: ECOG PERFORMANCE STATUS: 1 - Symptomatic but completely ambulatory  There were no vitals filed for this visit. There were no vitals filed for this visit.  GENERAL: alert, no distress and comfortable SKIN: skin color, texture, turgor are normal, no rashes or significant lesions EYES: normal, Conjunctiva are pink and non-injected, sclera clear OROPHARYNX: no exudate, no erythema and lips, buccal mucosa, and tongue normal  NECK: supple, thyroid normal size, non-tender, without nodularity LYMPH: no palpable lymphadenopathy in the cervical, axillary or inguinal LUNGS: clear to auscultation and percussion with normal breathing effort HEART: regular rate & rhythm and no murmurs and no lower extremity edema  ABDOMEN: abdomen soft, non-tender and normal bowel sounds MUSCULOSKELETAL: no cyanosis of digits and no clubbing  NEURO: alert & oriented x 3 with fluent speech, no focal motor/sensory deficits EXTREMITIES: No lower extremity edema  LABORATORY DATA:  I have reviewed the data as listed CMP Latest Ref Rng & Units 03/02/2017 06/23/2016 01/21/2015  Glucose 65 - 139 mg/dL 79 105(H) 84  BUN 7 - 25 mg/dL _0 Creatinine 0.50 - 0.99 mg/dL 0.81 0.77 0.92  Sodium 135 - 146 mmol/L 141 142 140  Potassium 3.5 - 5.3 mmol/L 3.9 3.2(L) 4.2  Chloride 98 - 110 mmol/L 108 108 107  CO2 20 - 32 mmol/L _1 Calcium 8.6 - 10.4 mg/dL 9.3 8.9 9.4  Total Protein 6.1 - 8.1 g/dL 7.2 7.7 -  Total Bilirubin 0.2 - 1.2 mg/dL 0.2 0.3 -  Alkaline Phos 39 - 117 U/L - 96 -  AST 10 - 35 U/L 16 12 -  ALT 6 - 29 U/L 25 13 -    Lab Results  Component Value Date   WBC 9.0 06/23/2016   HGB 10.9 (L) 06/23/2016   HCT 34.3 (L) 06/23/2016   MCV 84.1 06/23/2016  PLT 393.0 06/23/2016   NEUTROABS 2.9 12/10/2014    ASSESSMENT & PLAN:  Primary cancer of lower-inner quadrant of left female breast ([place]) left lumpectomy 01/28/2015: DCIS 1.8 cm, margins negative, closest margin 0.3 cm, ALH,fibrocystic changes with calcifications, Tis N0 stage 0, ER 100%, PR 100% low to intermediate grade Completed adjuvant radiation therapy 04/08/2015 to 05/18/2015 (Dr.Squire)  Current treatment: Antiestrogen therapy with tamoxifen 5 yearsstarted May 2017  Tamoxifen toxicities: 1. Intermittent hot flashes  Patient had brest reduction surgery on the right side and left mammoplasty 10/22/2015: Benign fibrocystic changes with focal lobular hyperplasia.  Breast cancer surveillance: 1.  Breast exam 02/22/2018: Benign 2.  Mammogram needs to be scheduled supposed to have been done by December 2019  Return to clinic in 1 year for follow-up  Iron deficiency anemia December 2019: Iron saturation 6% Prior treatment: IV iron  December to January 2020  Lab review:  Return to clinic in 6 months with labs and follow-up.    No orders of the defined types were placed in this encounter.  The patient has a good understanding of the overall plan. she agrees with it. she will call with any problems that may develop before the next visit here.  Nicholas Lose, MD 06/22/2018  Julious Oka Dorshimer am acting as scribe for Dr. Nicholas Lose.  I have reviewed the above documentation for accuracy and completeness, and I agree with the above.

## 2018-06-22 ENCOUNTER — Ambulatory Visit: Payer: Self-pay | Admitting: Hematology and Oncology

## 2018-09-04 DIAGNOSIS — C50912 Malignant neoplasm of unspecified site of left female breast: Secondary | ICD-10-CM | POA: Diagnosis not present

## 2018-09-04 DIAGNOSIS — J4520 Mild intermittent asthma, uncomplicated: Secondary | ICD-10-CM | POA: Diagnosis not present

## 2018-09-04 DIAGNOSIS — I10 Essential (primary) hypertension: Secondary | ICD-10-CM | POA: Diagnosis not present

## 2018-09-04 DIAGNOSIS — E785 Hyperlipidemia, unspecified: Secondary | ICD-10-CM | POA: Diagnosis not present

## 2018-09-04 DIAGNOSIS — D509 Iron deficiency anemia, unspecified: Secondary | ICD-10-CM | POA: Diagnosis not present
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
## 2018-09-25 ENCOUNTER — Encounter (HOSPITAL_COMMUNITY): Payer: Self-pay | Admitting: Psychiatry

## 2018-09-25 ENCOUNTER — Other Ambulatory Visit: Payer: Self-pay

## 2018-09-25 ENCOUNTER — Ambulatory Visit (INDEPENDENT_AMBULATORY_CARE_PROVIDER_SITE_OTHER): Payer: Medicare Other | Admitting: Psychiatry

## 2018-09-25 DIAGNOSIS — F0630 Mood disorder due to known physiological condition, unspecified: Secondary | ICD-10-CM | POA: Diagnosis not present

## 2018-09-25 DIAGNOSIS — F4310 Post-traumatic stress disorder, unspecified: Secondary | ICD-10-CM | POA: Diagnosis not present

## 2018-09-25 MED ORDER — ESCITALOPRAM OXALATE 10 MG PO TABS: 10.0000 mg | ORAL_TABLET | Freq: Every day | ORAL | 0 refills | Status: DC

## 2018-09-25 MED ORDER — HYDROXYZINE PAMOATE 25 MG PO CAPS: 25.0000 mg | ORAL_CAPSULE | Freq: Every day | ORAL | 0 refills | Status: DC | PRN

## 2018-09-25 MED ORDER — LITHIUM CARBONATE ER 300 MG PO TBCR: 300.0000 mg | EXTENDED_RELEASE_TABLET | Freq: Two times a day (BID) | ORAL | 0 refills | Status: DC

## 2018-09-25 NOTE — Patient Instructions (Signed)
1. Continue Lithium 300 mg twice a day 2.Continue lexapro 10 mg daily  3. Continuehydroxyzine 25 mg once to twice a day as needed for anxiety 4. Next appointment: 10/19 at 1 PM

## 2018-12-07 ENCOUNTER — Ambulatory Visit
Admission: RE | Admit: 2018-12-07 | Discharge: 2018-12-07 | Disposition: A | Discharge status: Home / Self Care | Payer: Medicare Other | Source: Ambulatory Visit | Attending: Internal Medicine | Admitting: Internal Medicine

## 2018-12-07 ENCOUNTER — Other Ambulatory Visit: Payer: Self-pay | Admitting: Internal Medicine

## 2018-12-07 DIAGNOSIS — R0609 Other forms of dyspnea: Secondary | ICD-10-CM | POA: Diagnosis not present

## 2018-12-07 DIAGNOSIS — R0600 Dyspnea, unspecified: Secondary | ICD-10-CM | POA: Diagnosis not present

## 2018-12-07 DIAGNOSIS — J4520 Mild intermittent asthma, uncomplicated: Secondary | ICD-10-CM | POA: Diagnosis not present

## 2018-12-07 DIAGNOSIS — K219 Gastro-esophageal reflux disease without esophagitis: Secondary | ICD-10-CM | POA: Diagnosis not present
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
## 2018-12-24 ENCOUNTER — Ambulatory Visit (INDEPENDENT_AMBULATORY_CARE_PROVIDER_SITE_OTHER): Payer: Medicare Other | Admitting: Psychiatry

## 2018-12-24 ENCOUNTER — Encounter (HOSPITAL_COMMUNITY): Payer: Self-pay | Admitting: Psychiatry

## 2018-12-24 ENCOUNTER — Other Ambulatory Visit: Payer: Self-pay

## 2018-12-24 DIAGNOSIS — F0630 Mood disorder due to known physiological condition, unspecified: Secondary | ICD-10-CM | POA: Diagnosis not present

## 2018-12-24 DIAGNOSIS — F4310 Post-traumatic stress disorder, unspecified: Secondary | ICD-10-CM | POA: Diagnosis not present

## 2018-12-24 MED ORDER — ARIPIPRAZOLE 2 MG PO TABS: 2.0000 mg | ORAL_TABLET | Freq: Every day | ORAL | 2 refills | Status: DC

## 2018-12-24 MED ORDER — HYDROXYZINE PAMOATE 25 MG PO CAPS: 25.0000 mg | ORAL_CAPSULE | Freq: Every day | ORAL | 0 refills | Status: DC | PRN

## 2018-12-24 MED ORDER — LITHIUM CARBONATE ER 300 MG PO TBCR: 300.0000 mg | EXTENDED_RELEASE_TABLET | Freq: Two times a day (BID) | ORAL | 0 refills | Status: DC

## 2018-12-24 MED ORDER — ESCITALOPRAM OXALATE 10 MG PO TABS: 10.0000 mg | ORAL_TABLET | Freq: Every day | ORAL | 0 refills | Status: DC

## 2018-12-24 NOTE — Patient Instructions (Signed)
1. Continue Lithium 300 mg twice a day 2.Continue lexapro10 mg daily  3. Start Abilify 2 mg daily   4. Continuehydroxyzine 25 mgonce totwice a day as needed for anxiety 5. Next appointment: in December 6. Obtain blood test at [place]

## 2018-12-25 NOTE — Addendum Note (Signed)
Addended by: Norman Clay on: 12/25/2018 08:50 AM   Modules accepted: Orders

## 2019-01-08 DIAGNOSIS — R9431 Abnormal electrocardiogram [ECG] [EKG]: Secondary | ICD-10-CM | POA: Insufficient documentation

## 2019-01-08 NOTE — Progress Notes (Signed)
Canceled.

## 2019-01-09 ENCOUNTER — Ambulatory Visit: Payer: Self-pay | Admitting: Cardiology

## 2019-01-17 ENCOUNTER — Encounter: Payer: Self-pay | Admitting: Cardiology

## 2019-01-17 ENCOUNTER — Ambulatory Visit: Payer: Medicare Other | Admitting: Cardiology

## 2019-01-17 ENCOUNTER — Other Ambulatory Visit: Payer: Self-pay

## 2019-01-17 VITALS — BP 155/82 | HR 94 | Ht 67.0 in | Wt 312.0 lb

## 2019-01-17 DIAGNOSIS — R9431 Abnormal electrocardiogram [ECG] [EKG]: Secondary | ICD-10-CM

## 2019-01-17 DIAGNOSIS — Z9221 Personal history of antineoplastic chemotherapy: Secondary | ICD-10-CM

## 2019-01-17 DIAGNOSIS — I10 Essential (primary) hypertension: Secondary | ICD-10-CM | POA: Diagnosis not present

## 2019-01-17 DIAGNOSIS — Z01810 Encounter for preprocedural cardiovascular examination: Secondary | ICD-10-CM

## 2019-01-17 MED ORDER — CARVEDILOL 6.25 MG PO TABS: 6.2500 mg | ORAL_TABLET | Freq: Two times a day (BID) | ORAL | 2 refills | Status: DC

## 2019-01-17 NOTE — Progress Notes (Signed)
Patient referred by Leeroy Cha,* for abnormal EKG  Subjective:   Caitlyn Bautista, female    DOB: 10-Oct-1950, 68 y.o.   MRN: [id number]   Chief Complaint  Patient presents with  . Abnormal ECG  . New Patient (Initial Visit)    HPI  68 y.o. African-American female with hypertension, history of breast cancer, bipolar disorder, multiple musculoskeletal complaints, referred for abnormal EKG.  Patient has longstanding history of hypertension, but is only on amlodipine 10 mg at this time.  Blood pressure remains uncontrolled.  She has baseline, unchanged dyspnea on exertion.  She has retrosternal and left-sided pain that is worse with certain movements, and not worse with exertion.  Patient is supposed to undergo extraction of multiple teeth in the near future.  Past Medical History:  Diagnosis Date  . Allergy   . Anemia   . Anxiety   . Arachnoiditis   . Arthritis   . Asthma   . Bipolar disorder ([place])   . Cancer of lower-inner quadrant of female breast ([place]) 12/05/2014  . Carpal tunnel syndrome   . Carpal tunnel syndrome, bilateral   . Cervical herniated disc   . Depression    bipolar depression  . Fibromyalgia   . GERD (gastroesophageal reflux disease)   . History of bronchitis   . History of chicken pox   . History of pneumonia 1996  . Hyperlipidemia    pt. denies  . Hypertension   . Incontinence of urine in female   . Personal history of radiation therapy   . PTSD (post-traumatic stress disorder)   . Scoliosis   . Sleep apnea    does not use her CPAP  . Thyroid disease    "very mild"  . Urinary frequency   . Urinary urgency      Past Surgical History:  Procedure Laterality Date  . ABDOMINAL HYSTERECTOMY  1990's  . BREAST LUMPECTOMY  1970's   right  . BREAST LUMPECTOMY Left 01/28/2015  . BREAST LUMPECTOMY WITH NEEDLE LOCALIZATION Left 01/28/2015   Procedure: LEFT BREAST LUMPECTOMY WITH NEEDLE LOCALIZATION;  Surgeon: [name], MD;  Location: [place];  Service: General;  Laterality: Left;  . BREAST REDUCTION SURGERY Left 01/28/2015   Procedure: BREAST REDUCTION;  Surgeon: Loel Lofty Dillingham, DO;  Location: [place];  Service: Plastics;  Laterality: Left;  . BREAST REDUCTION SURGERY Right 10/22/2015   Procedure: RIGHT MAMMARY REDUCTION  (BREAST) FOR SYMMETRY;  Surgeon: Wallace Going, DO;  Location: [place];  Service: Plastics;  Laterality: Right;  . COLONOSCOPY    . DILATION AND CURETTAGE OF UTERUS    . REDUCTION MAMMAPLASTY Bilateral 2017  . TUBAL LIGATION       Social History   Socioeconomic History  . Marital status: Single    Spouse name: Not on file  . Number of children: 4  . Years of education: 81  . Highest education level: Not on file  Occupational History  . Occupation: Disabled    Employer: DISABLED  Social Needs  . Financial resource strain: Not on file  . Food insecurity    Worry: Not on file    Inability: Not on file  . Transportation needs    Medical: Not on file    Non-medical: Not on file  Tobacco Use  . Smoking status: Never Smoker  . Smokeless tobacco: Never Used  Substance and Sexual Activity  . Alcohol use: Yes    Comment: socially  . Drug use: No  .  Sexual activity: Never  Lifestyle  . Physical activity    Days per week: Not on file    Minutes per session: Not on file  . Stress: Not on file  Relationships  . Social Herbalist on phone: Not on file    Gets together: Not on file    Attends religious service: Not on file    Active member of club or organization: Not on file    Attends meetings of clubs or organizations: Not on file    Relationship status: Not on file  . Intimate partner violence    Fear of current or ex partner: Not on file    Emotionally abused: Not on file    Physically abused: Not on file    Forced sexual activity: Not on file  Other Topics Concern  . Not on file  Social History Narrative   Regular exercise-no   Caffeine Use-yes      Family History  Problem Relation Age of Onset  . Cancer Mother        unspecified type; dx. 56 or younger  . Ovarian cancer Sister 29  . Breast cancer Maternal Aunt        bilateral - dx. 30s, 69s  . Ovarian cancer Maternal Grandmother 100  . Ovarian cancer Sister 23  . Cervical cancer Sister 34  . Pancreatic cancer Maternal Uncle        dx. 50s-60s  . Cancer Cousin        either ovarian or cervical cancer  . Cancer Daughter        hx of TAH-BSO due to fibroids and bleeding  . Bipolar disorder Daughter   . Arthritis Other        Parent  . Hypertension Other        Parent  . Arthritis Other        Grandparent  . Hypertension Other        Grandparent  . Arthritis Other        Other Blood Relative  . Hypertension Other        Other Blood Relative  . Diabetes Other        Other Blood Relative     Current Outpatient Medications on File Prior to Visit  Medication Sig Dispense Refill  . acetaminophen (TYLENOL) 650 MG CR tablet Take 650 mg by mouth every 8 (eight) hours as needed for pain.    Marland Kitchen amLODipine (NORVASC) 10 MG tablet Take 1 tablet (10 mg total) by mouth daily. Need annual appointment for further refills 90 tablet 0  . ARIPiprazole (ABILIFY) 2 MG tablet Take 1 tablet (2 mg total) by mouth daily. 30 tablet 2  . BREO ELLIPTA 100-25 MCG/INH AEPB USE 1 INHALATION DAILY 180 each 0  . diclofenac sodium (VOLTAREN) 1 % GEL Apply 2 g topically 4 (four) times daily. 1 Tube 5  . doxycycline (VIBRA-TABS) 100 MG tablet Take 1 tablet (100 mg total) by mouth 2 (two) times daily. 14 tablet 0  . escitalopram (LEXAPRO) 10 MG tablet Take 1 tablet (10 mg total) by mouth daily. 90 tablet 0  . fexofenadine (ALLEGRA) 180 MG tablet Take 1 tablet (180 mg total) by mouth daily. For seasonal allergies.    . fluticasone (FLONASE) 50 MCG/ACT nasal spray USE 2 SPRAYS INTO BOTH  NOSTRILS DAILY FOR SEASONAL ALLERGIES. 48 g 3  . Ginger, Zingiber officinalis, (GINGER PO) Take by mouth.    .  hydrOXYzine (VISTARIL) 25 MG capsule  Take 1 capsule (25 mg total) by mouth daily as needed for anxiety. 90 capsule 0  . lithium carbonate (LITHOBID) 300 MG CR tablet Take 1 tablet (300 mg total) by mouth 2 (two) times daily. 180 tablet 0  . mineral oil liquid Take 15 mLs by mouth daily as needed for moderate constipation.    . montelukast (SINGULAIR) 10 MG tablet Take 1 tablet (10 mg total) by mouth at bedtime. Need annual appointment for further refills 90 tablet 0  . MYRBETRIQ 50 MG TB24 tablet Take 50 mg by mouth daily.    . non-metallic deodorant Jethro Poling) MISC Apply 1 application topically daily as needed. Reported on 06/19/2015    . pantoprazole (PROTONIX) 40 MG tablet Take 1 tablet (40 mg total) by mouth daily. Need annual appointment for further refills 90 tablet 0  . PROAIR HFA 108 (90 Base) MCG/ACT inhaler USE 1 PUFF EVERY 4 HOURS AS NEEDED FOR WHEEZING OR  SHORTNESS OF BREATH. 25.5 g 0  . Turmeric Curcumin 500 MG CAPS Take by mouth.     No current facility-administered medications on file prior to visit.     Cardiovascular studies:  EKG 01/17/2019: Sinus rhythm 92 bpm. Nonspecific T-abnormality.     Recent labs: 01/10/2018: Glucose 112.  BUN/creatinine 15/0.76.  eGFR 76.  Sodium 144, potassium 4.0. H/H 10.2/32.4.  MCV 82.9.  Platelets 434.  White blood cell count 11.9, mildly elevated.    Review of Systems  Constitution: Negative for decreased appetite, malaise/fatigue, weight gain and weight loss.  HENT: Negative for congestion.   Eyes: Negative for visual disturbance.  Cardiovascular: Positive for chest pain. Negative for dyspnea on exertion, leg swelling, palpitations and syncope.  Respiratory: Negative for cough.   Endocrine: Negative for cold intolerance.  Hematologic/Lymphatic: Does not bruise/bleed easily.  Skin: Negative for itching and rash.  Musculoskeletal: Positive for back pain and joint pain. Negative for myalgias.  Gastrointestinal: Negative for abdominal  pain, nausea and vomiting.  Genitourinary: Negative for dysuria.  Neurological: Negative for dizziness and weakness.  Psychiatric/Behavioral: The patient is not nervous/anxious.   All other systems reviewed and are negative.        Vitals:   01/17/19 1447  BP: (!) 155/82  Pulse: 94  SpO2: 97%     Body mass index is 48.87 kg/m. Filed Weights   01/17/19 1447  Weight: (!) 312 lb (141.5 kg)     Objective:   Physical Exam  Constitutional: She is oriented to person, place, and time. She appears well-developed and well-nourished. No distress.  Morbid obesity  HENT:  Head: Normocephalic and atraumatic.  Eyes: Pupils are equal, round, and reactive to light. Conjunctivae are normal.  Neck: No JVD present.  Cardiovascular: Normal rate, regular rhythm and intact distal pulses.  Pulmonary/Chest: Effort normal and breath sounds normal. She has no wheezes. She has no rales. She exhibits tenderness.  Abdominal: Soft. Bowel sounds are normal. There is no rebound.  Musculoskeletal:        General: No edema.  Lymphadenopathy:    She has no cervical adenopathy.  Neurological: She is alert and oriented to person, place, and time. No cranial nerve deficit.  Skin: Skin is warm and dry.  Psychiatric: She has a normal mood and affect.  Nursing note and vitals reviewed.       Assessment & Recommendations:    68 y.o. African-American female with hypertension, history of breast cancer, bipolar disorder, multiple musculoskeletal complaints, referred for abnormal EKG.  Chest pain: Appears musculoskeletal in  nature.  EKG with nonspecific ST-T changes.  Do not recommend stress testing at this time.  Hypertension: Uncontrolled.  Added carvedilol 6.25 mg twice daily.  Exertional dyspnea: No signs of heart failure on exam.  Nonetheless, given her history of chemotherapy, recommend echocardiogram and stress imaging.  Preoperative stratification: If echocardiogram shows no significant  abnormalities, reasonable to proceed with dental extraction with low cardiac risk.   Thank you for referring the patient to [place]. Please feel free to contact with any questions.  Nigel Mormon, MD [facility] Pager: [phone] Office: [phone]

## 2019-01-18 ENCOUNTER — Encounter: Payer: Self-pay | Admitting: Cardiology

## 2019-01-18 DIAGNOSIS — Z9221 Personal history of antineoplastic chemotherapy: Secondary | ICD-10-CM | POA: Insufficient documentation

## 2019-02-11 ENCOUNTER — Ambulatory Visit (HOSPITAL_COMMUNITY)
Admission: RE | Admit: 2019-02-11 | Discharge: 2019-02-11 | Disposition: A | Discharge status: Home / Self Care | Payer: Medicare Other | Source: Ambulatory Visit | Attending: Cardiology | Admitting: Cardiology

## 2019-02-11 ENCOUNTER — Other Ambulatory Visit: Payer: Self-pay

## 2019-02-11 DIAGNOSIS — E785 Hyperlipidemia, unspecified: Secondary | ICD-10-CM | POA: Diagnosis not present

## 2019-02-11 DIAGNOSIS — Z9221 Personal history of antineoplastic chemotherapy: Secondary | ICD-10-CM | POA: Diagnosis not present

## 2019-02-11 DIAGNOSIS — I10 Essential (primary) hypertension: Secondary | ICD-10-CM | POA: Diagnosis not present

## 2019-02-11 DIAGNOSIS — R9431 Abnormal electrocardiogram [ECG] [EKG]: Secondary | ICD-10-CM | POA: Insufficient documentation

## 2019-02-11 NOTE — Progress Notes (Signed)
Okay to proceed with planned dental surgery.  Thanks MJP

## 2019-02-11 NOTE — Progress Notes (Signed)
  Echocardiogram 2D Echocardiogram has been performed.  Caitlyn Bautista 02/11/2019, 11:28 AM

## 2019-02-15 ENCOUNTER — Telehealth: Payer: Self-pay

## 2019-02-15 DIAGNOSIS — R0600 Dyspnea, unspecified: Secondary | ICD-10-CM | POA: Diagnosis not present

## 2019-02-15 DIAGNOSIS — J4520 Mild intermittent asthma, uncomplicated: Secondary | ICD-10-CM | POA: Diagnosis not present

## 2019-02-15 DIAGNOSIS — K219 Gastro-esophageal reflux disease without esophagitis: Secondary | ICD-10-CM | POA: Diagnosis not present

## 2019-02-15 DIAGNOSIS — I10 Essential (primary) hypertension: Secondary | ICD-10-CM | POA: Diagnosis not present

## 2019-02-15 NOTE — Telephone Encounter (Signed)
Patient had ECHO. Needs medical clearance form faxed to Dr. Deanna Artis, [address] [place], [place] [address] Phone: [phone]. Could not obtain fax number. Office was closed on Friday and Monday. I will reach back out on Wednesday to office.

## 2019-02-21 NOTE — Telephone Encounter (Signed)
Clearance faxed to Dr. Virgel Paling [phone]

## 2019-02-21 NOTE — Telephone Encounter (Signed)
Yes. Gave to front desk yesterday.

## 2019-02-21 NOTE — Telephone Encounter (Signed)
Hi has the surgical clearance been completed. Thanks

## 2019-02-25 ENCOUNTER — Ambulatory Visit (HOSPITAL_COMMUNITY): Payer: Medicare Other | Admitting: Psychiatry

## 2019-02-26 NOTE — Telephone Encounter (Signed)
I gave this to you, did you get it?

## 2019-03-07 ENCOUNTER — Ambulatory Visit: Payer: Medicare Other | Admitting: Psychiatry

## 2019-03-14 ENCOUNTER — Ambulatory Visit: Payer: Medicare Other | Admitting: Psychiatry

## 2019-03-14 ENCOUNTER — Other Ambulatory Visit: Payer: Self-pay

## 2019-04-30 ENCOUNTER — Ambulatory Visit (HOSPITAL_COMMUNITY): Payer: Medicare Other | Admitting: Psychiatry

## 2019-05-13 ENCOUNTER — Other Ambulatory Visit: Payer: Self-pay | Admitting: Cardiology

## 2019-05-13 ENCOUNTER — Telehealth (HOSPITAL_COMMUNITY): Payer: Self-pay | Admitting: Psychiatry

## 2019-05-13 DIAGNOSIS — I10 Essential (primary) hypertension: Secondary | ICD-10-CM

## 2019-05-13 MED ORDER — ARIPIPRAZOLE 2 MG PO TABS: 2.0000 mg | ORAL_TABLET | Freq: Every day | ORAL | 0 refills | Status: DC
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
## 2019-05-13 NOTE — Telephone Encounter (Signed)
SPOKE WITH PATIENT  PER PROVIDER: Ordered abilify refill per request. Please advise the patient to make follow up. No more refill without evaluation. PATIENT HAS APPOINTMENT 05/16/19 @ 3 PM

## 2019-05-13 NOTE — Telephone Encounter (Signed)
Ordered abilify refill per request. Please advise the patient to make follow up. No more refill without evaluation.

## 2019-05-16 ENCOUNTER — Other Ambulatory Visit: Payer: Self-pay

## 2019-05-16 ENCOUNTER — Ambulatory Visit (INDEPENDENT_AMBULATORY_CARE_PROVIDER_SITE_OTHER): Payer: Medicare Other | Admitting: Psychiatry

## 2019-05-16 ENCOUNTER — Encounter (HOSPITAL_COMMUNITY): Payer: Self-pay | Admitting: Psychiatry

## 2019-05-16 DIAGNOSIS — F0630 Mood disorder due to known physiological condition, unspecified: Secondary | ICD-10-CM

## 2019-05-16 MED ORDER — LITHIUM CARBONATE ER 300 MG PO TBCR: 300.0000 mg | EXTENDED_RELEASE_TABLET | Freq: Two times a day (BID) | ORAL | 0 refills | Status: AC

## 2019-05-16 MED ORDER — HYDROXYZINE PAMOATE 25 MG PO CAPS: 25.0000 mg | ORAL_CAPSULE | Freq: Every day | ORAL | 0 refills | Status: DC | PRN

## 2019-05-16 MED ORDER — ARIPIPRAZOLE 2 MG PO TABS: 2.0000 mg | ORAL_TABLET | Freq: Every day | ORAL | 0 refills | Status: DC

## 2019-05-16 MED ORDER — ESCITALOPRAM OXALATE 10 MG PO TABS: 10.0000 mg | ORAL_TABLET | Freq: Every day | ORAL | 0 refills | Status: DC

## 2019-05-16 NOTE — Patient Instructions (Signed)
1. Continue Lithium 300 mg twice a day 2.Continue lexapro10 mg daily  3. Continue Abilify 2 mg daily   4. Continuehydroxyzine 25 mgonce totwice a day as needed for anxiety 5.Next appointment: 6/7 at 1:20  6. Obtain blood test at [facility] (lithium, kidney function,thyroid)

## 2019-07-13 ENCOUNTER — Telehealth: Payer: Self-pay | Admitting: Physician Assistant

## 2019-07-13 NOTE — Telephone Encounter (Signed)
Called to discuss the homebound vaccination initiative with the patient and/or caregiver.   Message left to call back.  Kayshaun Polanco PA-C  [facility]     

## 2019-08-01 ENCOUNTER — Telehealth (HOSPITAL_COMMUNITY): Payer: Self-pay | Admitting: Psychiatry

## 2019-08-01 ENCOUNTER — Encounter (HOSPITAL_COMMUNITY): Payer: Self-pay | Admitting: *Deleted

## 2019-08-01 ENCOUNTER — Telehealth: Payer: Self-pay | Admitting: Unknown Physician Specialty
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
## 2019-08-01 NOTE — Telephone Encounter (Signed)
Called to screen for homebound vaccine initiative.  Unable to leave message.

## 2019-08-01 NOTE — Telephone Encounter (Signed)
Please contact the patient and advise her to obtain blood test as discussed at her last visit. Please also remind the patient that I would not be able to continue to prescribe lithium unless she gets this blood test.

## 2019-08-01 NOTE — Telephone Encounter (Signed)
LMOM

## 2019-08-01 NOTE — Telephone Encounter (Signed)
Message was sent to patient via her mychart.

## 2019-08-02 NOTE — Telephone Encounter (Signed)
Called patient to inform her with what provider stated and message stated that voicemail box is full and can not accept new messages at this time.

## 2019-08-05 ENCOUNTER — Telehealth: Payer: Self-pay | Admitting: Adult Health

## 2019-08-05 NOTE — Telephone Encounter (Signed)
Attempted to call patient about covid 19 homebound vaccine program with the use of Moderna vaccine, if not already immunized.  I received her voicemail and was unable to leave a message because her voicemail was full.  Wilber Bihari, NP

## 2019-08-06 ENCOUNTER — Telehealth: Payer: Self-pay | Admitting: Adult Health

## 2019-08-06 NOTE — Telephone Encounter (Signed)
Called patient to discuss and offer covid 19 homebound vaccination with moderna vaccine if not already immunized.  Call went directly to voice mail.  Unable ot leave message as voice mail was full.    Wilber Bihari, NP

## 2019-08-12 ENCOUNTER — Telehealth (HOSPITAL_COMMUNITY): Payer: Self-pay | Admitting: Psychiatry

## 2019-08-12 ENCOUNTER — Other Ambulatory Visit: Payer: Self-pay

## 2019-08-12 ENCOUNTER — Telehealth (HOSPITAL_COMMUNITY): Payer: Medicare Other | Admitting: Psychiatry

## 2019-08-12 NOTE — Telephone Encounter (Signed)
Sent link for video visit through Epic. Patient did not sign in. Called the patient for appointment scheduled today. The patient did not answer the phone. Voice message was full.  

## 2019-11-07 ENCOUNTER — Other Ambulatory Visit (HOSPITAL_COMMUNITY): Payer: Self-pay | Admitting: Psychiatry

## 2019-11-07 ENCOUNTER — Telehealth (HOSPITAL_COMMUNITY): Payer: Self-pay | Admitting: *Deleted

## 2019-11-07 MED ORDER — ARIPIPRAZOLE 2 MG PO TABS: 2.0000 mg | ORAL_TABLET | Freq: Every day | ORAL | 0 refills | Status: AC

## 2019-11-07 MED ORDER — ESCITALOPRAM OXALATE 10 MG PO TABS: 10.0000 mg | ORAL_TABLET | Freq: Every day | ORAL | 0 refills | Status: AC

## 2019-11-07 MED ORDER — HYDROXYZINE PAMOATE 25 MG PO CAPS: 25.0000 mg | ORAL_CAPSULE | Freq: Every day | ORAL | 0 refills | Status: DC | PRN

## 2019-11-07 MED ORDER — ESCITALOPRAM OXALATE 10 MG PO TABS: 10.0000 mg | ORAL_TABLET | Freq: Every day | ORAL | 0 refills | Status: DC

## 2019-11-07 MED ORDER — ARIPIPRAZOLE 2 MG PO TABS: 2.0000 mg | ORAL_TABLET | Freq: Every day | ORAL | 0 refills | Status: DC

## 2019-11-07 MED ORDER — HYDROXYZINE PAMOATE 25 MG PO CAPS: 25.0000 mg | ORAL_CAPSULE | Freq: Every day | ORAL | 0 refills | Status: AC | PRN

## 2019-11-07 NOTE — Telephone Encounter (Signed)
Ordered Lexapro, Abilify, Hydroxyzine for 90 days. Lithium will not be ordered unless she gets blood test, which had  been discussed several times in the past. Will plan to send a discharge letter based on her request of transfer the care.

## 2019-11-07 NOTE — Telephone Encounter (Signed)
Patient is trying to transfer to another provider and is running out of her medications and would like to know if provider here can refill enough until she is transferred. Pt is still waiting for this office to transfer her records. Another release will be mailed to patient to complete.

## 2019-11-07 NOTE — Telephone Encounter (Signed)
Noted, patient is aware. 

## 2020-05-15 DIAGNOSIS — M1990 Unspecified osteoarthritis, unspecified site: Secondary | ICD-10-CM | POA: Diagnosis not present

## 2020-05-15 DIAGNOSIS — J4530 Mild persistent asthma, uncomplicated: Secondary | ICD-10-CM | POA: Diagnosis not present

## 2020-05-15 DIAGNOSIS — D509 Iron deficiency anemia, unspecified: Secondary | ICD-10-CM | POA: Diagnosis not present

## 2020-05-15 DIAGNOSIS — C50912 Malignant neoplasm of unspecified site of left female breast: Secondary | ICD-10-CM | POA: Diagnosis not present

## 2020-05-15 DIAGNOSIS — E785 Hyperlipidemia, unspecified: Secondary | ICD-10-CM | POA: Diagnosis not present

## 2020-05-15 DIAGNOSIS — K219 Gastro-esophageal reflux disease without esophagitis: Secondary | ICD-10-CM | POA: Diagnosis not present

## 2020-05-15 DIAGNOSIS — I10 Essential (primary) hypertension: Secondary | ICD-10-CM | POA: Diagnosis not present

## 2020-05-15 DIAGNOSIS — J4520 Mild intermittent asthma, uncomplicated: Secondary | ICD-10-CM | POA: Diagnosis not present

## 2020-05-15 DIAGNOSIS — M150 Primary generalized (osteo)arthritis: Secondary | ICD-10-CM | POA: Diagnosis not present

## 2020-07-21 DIAGNOSIS — J4530 Mild persistent asthma, uncomplicated: Secondary | ICD-10-CM | POA: Diagnosis not present

## 2020-07-21 DIAGNOSIS — D509 Iron deficiency anemia, unspecified: Secondary | ICD-10-CM | POA: Diagnosis not present

## 2020-07-21 DIAGNOSIS — M150 Primary generalized (osteo)arthritis: Secondary | ICD-10-CM | POA: Diagnosis not present

## 2020-07-21 DIAGNOSIS — E785 Hyperlipidemia, unspecified: Secondary | ICD-10-CM | POA: Diagnosis not present

## 2020-07-21 DIAGNOSIS — K219 Gastro-esophageal reflux disease without esophagitis: Secondary | ICD-10-CM | POA: Diagnosis not present

## 2020-07-21 DIAGNOSIS — C50912 Malignant neoplasm of unspecified site of left female breast: Secondary | ICD-10-CM | POA: Diagnosis not present

## 2020-07-21 DIAGNOSIS — I10 Essential (primary) hypertension: Secondary | ICD-10-CM | POA: Diagnosis not present

## 2020-07-21 DIAGNOSIS — M1990 Unspecified osteoarthritis, unspecified site: Secondary | ICD-10-CM | POA: Diagnosis not present

## 2020-07-21 DIAGNOSIS — J4520 Mild intermittent asthma, uncomplicated: Secondary | ICD-10-CM | POA: Diagnosis not present

## 2020-08-14 DIAGNOSIS — I10 Essential (primary) hypertension: Secondary | ICD-10-CM | POA: Diagnosis not present

## 2020-08-14 DIAGNOSIS — K219 Gastro-esophageal reflux disease without esophagitis: Secondary | ICD-10-CM | POA: Diagnosis not present

## 2020-08-14 DIAGNOSIS — E785 Hyperlipidemia, unspecified: Secondary | ICD-10-CM | POA: Diagnosis not present

## 2020-08-14 DIAGNOSIS — J4530 Mild persistent asthma, uncomplicated: Secondary | ICD-10-CM | POA: Diagnosis not present

## 2020-08-14 DIAGNOSIS — J4520 Mild intermittent asthma, uncomplicated: Secondary | ICD-10-CM | POA: Diagnosis not present

## 2020-08-14 DIAGNOSIS — M150 Primary generalized (osteo)arthritis: Secondary | ICD-10-CM | POA: Diagnosis not present

## 2020-08-14 DIAGNOSIS — M1990 Unspecified osteoarthritis, unspecified site: Secondary | ICD-10-CM | POA: Diagnosis not present

## 2020-09-29 DIAGNOSIS — Z23 Encounter for immunization: Secondary | ICD-10-CM | POA: Diagnosis not present

## 2020-10-09 DIAGNOSIS — G894 Chronic pain syndrome: Secondary | ICD-10-CM | POA: Diagnosis not present

## 2020-10-09 DIAGNOSIS — M25561 Pain in right knee: Secondary | ICD-10-CM | POA: Diagnosis not present

## 2020-10-09 DIAGNOSIS — M542 Cervicalgia: Secondary | ICD-10-CM | POA: Diagnosis not present

## 2020-10-09 DIAGNOSIS — R6889 Other general symptoms and signs: Secondary | ICD-10-CM | POA: Diagnosis not present

## 2020-10-09 DIAGNOSIS — G5603 Carpal tunnel syndrome, bilateral upper limbs: Secondary | ICD-10-CM | POA: Diagnosis not present

## 2020-10-09 DIAGNOSIS — M5416 Radiculopathy, lumbar region: Secondary | ICD-10-CM | POA: Diagnosis not present

## 2020-10-09 DIAGNOSIS — M25562 Pain in left knee: Secondary | ICD-10-CM | POA: Diagnosis not present

## 2020-10-14 DIAGNOSIS — I10 Essential (primary) hypertension: Secondary | ICD-10-CM | POA: Diagnosis not present

## 2020-10-14 DIAGNOSIS — M150 Primary generalized (osteo)arthritis: Secondary | ICD-10-CM | POA: Diagnosis not present

## 2020-10-14 DIAGNOSIS — J4520 Mild intermittent asthma, uncomplicated: Secondary | ICD-10-CM | POA: Diagnosis not present

## 2020-10-14 DIAGNOSIS — J4530 Mild persistent asthma, uncomplicated: Secondary | ICD-10-CM | POA: Diagnosis not present

## 2020-10-14 DIAGNOSIS — D509 Iron deficiency anemia, unspecified: Secondary | ICD-10-CM | POA: Diagnosis not present

## 2020-10-14 DIAGNOSIS — M1990 Unspecified osteoarthritis, unspecified site: Secondary | ICD-10-CM | POA: Diagnosis not present

## 2020-10-14 DIAGNOSIS — K219 Gastro-esophageal reflux disease without esophagitis: Secondary | ICD-10-CM | POA: Diagnosis not present

## 2020-10-14 DIAGNOSIS — E785 Hyperlipidemia, unspecified: Secondary | ICD-10-CM | POA: Diagnosis not present

## 2020-11-05 DIAGNOSIS — G5603 Carpal tunnel syndrome, bilateral upper limbs: Secondary | ICD-10-CM | POA: Diagnosis not present

## 2020-11-05 DIAGNOSIS — K219 Gastro-esophageal reflux disease without esophagitis: Secondary | ICD-10-CM | POA: Diagnosis not present

## 2020-11-05 DIAGNOSIS — Z853 Personal history of malignant neoplasm of breast: Secondary | ICD-10-CM | POA: Diagnosis not present

## 2020-11-05 DIAGNOSIS — I10 Essential (primary) hypertension: Secondary | ICD-10-CM | POA: Diagnosis not present

## 2020-11-05 DIAGNOSIS — E785 Hyperlipidemia, unspecified: Secondary | ICD-10-CM | POA: Diagnosis not present

## 2020-11-05 DIAGNOSIS — M150 Primary generalized (osteo)arthritis: Secondary | ICD-10-CM | POA: Diagnosis not present

## 2020-11-05 DIAGNOSIS — J4530 Mild persistent asthma, uncomplicated: Secondary | ICD-10-CM | POA: Diagnosis not present

## 2020-11-05 DIAGNOSIS — Z9181 History of falling: Secondary | ICD-10-CM | POA: Diagnosis not present

## 2020-11-05 DIAGNOSIS — D509 Iron deficiency anemia, unspecified: Secondary | ICD-10-CM | POA: Diagnosis not present

## 2020-11-05 DIAGNOSIS — M5416 Radiculopathy, lumbar region: Secondary | ICD-10-CM | POA: Diagnosis not present

## 2020-11-05 DIAGNOSIS — G894 Chronic pain syndrome: Secondary | ICD-10-CM | POA: Diagnosis not present

## 2020-11-11 DIAGNOSIS — Z9181 History of falling: Secondary | ICD-10-CM | POA: Diagnosis not present

## 2020-11-11 DIAGNOSIS — J4530 Mild persistent asthma, uncomplicated: Secondary | ICD-10-CM | POA: Diagnosis not present

## 2020-11-11 DIAGNOSIS — G894 Chronic pain syndrome: Secondary | ICD-10-CM | POA: Diagnosis not present

## 2020-11-11 DIAGNOSIS — I10 Essential (primary) hypertension: Secondary | ICD-10-CM | POA: Diagnosis not present

## 2020-11-11 DIAGNOSIS — Z853 Personal history of malignant neoplasm of breast: Secondary | ICD-10-CM | POA: Diagnosis not present

## 2020-11-11 DIAGNOSIS — M150 Primary generalized (osteo)arthritis: Secondary | ICD-10-CM | POA: Diagnosis not present

## 2020-11-11 DIAGNOSIS — E785 Hyperlipidemia, unspecified: Secondary | ICD-10-CM | POA: Diagnosis not present

## 2020-11-11 DIAGNOSIS — M5416 Radiculopathy, lumbar region: Secondary | ICD-10-CM | POA: Diagnosis not present

## 2020-11-11 DIAGNOSIS — D509 Iron deficiency anemia, unspecified: Secondary | ICD-10-CM | POA: Diagnosis not present

## 2020-11-11 DIAGNOSIS — K219 Gastro-esophageal reflux disease without esophagitis: Secondary | ICD-10-CM | POA: Diagnosis not present

## 2020-11-11 DIAGNOSIS — G5603 Carpal tunnel syndrome, bilateral upper limbs: Secondary | ICD-10-CM | POA: Diagnosis not present

## 2020-11-17 DIAGNOSIS — E785 Hyperlipidemia, unspecified: Secondary | ICD-10-CM | POA: Diagnosis not present

## 2020-11-17 DIAGNOSIS — K219 Gastro-esophageal reflux disease without esophagitis: Secondary | ICD-10-CM | POA: Diagnosis not present

## 2020-11-17 DIAGNOSIS — I10 Essential (primary) hypertension: Secondary | ICD-10-CM | POA: Diagnosis not present

## 2020-11-17 DIAGNOSIS — G5603 Carpal tunnel syndrome, bilateral upper limbs: Secondary | ICD-10-CM | POA: Diagnosis not present

## 2020-11-17 DIAGNOSIS — D509 Iron deficiency anemia, unspecified: Secondary | ICD-10-CM | POA: Diagnosis not present

## 2020-11-17 DIAGNOSIS — G894 Chronic pain syndrome: Secondary | ICD-10-CM | POA: Diagnosis not present

## 2020-11-17 DIAGNOSIS — Z9181 History of falling: Secondary | ICD-10-CM | POA: Diagnosis not present

## 2020-11-17 DIAGNOSIS — M150 Primary generalized (osteo)arthritis: Secondary | ICD-10-CM | POA: Diagnosis not present

## 2020-11-17 DIAGNOSIS — J4530 Mild persistent asthma, uncomplicated: Secondary | ICD-10-CM | POA: Diagnosis not present

## 2020-11-17 DIAGNOSIS — Z853 Personal history of malignant neoplasm of breast: Secondary | ICD-10-CM | POA: Diagnosis not present

## 2020-11-17 DIAGNOSIS — M5416 Radiculopathy, lumbar region: Secondary | ICD-10-CM | POA: Diagnosis not present

## 2020-12-01 DIAGNOSIS — J4530 Mild persistent asthma, uncomplicated: Secondary | ICD-10-CM | POA: Diagnosis not present

## 2020-12-01 DIAGNOSIS — Z9181 History of falling: Secondary | ICD-10-CM | POA: Diagnosis not present

## 2020-12-01 DIAGNOSIS — I10 Essential (primary) hypertension: Secondary | ICD-10-CM | POA: Diagnosis not present

## 2020-12-01 DIAGNOSIS — K219 Gastro-esophageal reflux disease without esophagitis: Secondary | ICD-10-CM | POA: Diagnosis not present

## 2020-12-01 DIAGNOSIS — M5416 Radiculopathy, lumbar region: Secondary | ICD-10-CM | POA: Diagnosis not present

## 2020-12-01 DIAGNOSIS — G894 Chronic pain syndrome: Secondary | ICD-10-CM | POA: Diagnosis not present

## 2020-12-01 DIAGNOSIS — M150 Primary generalized (osteo)arthritis: Secondary | ICD-10-CM | POA: Diagnosis not present

## 2020-12-01 DIAGNOSIS — G5603 Carpal tunnel syndrome, bilateral upper limbs: Secondary | ICD-10-CM | POA: Diagnosis not present

## 2020-12-01 DIAGNOSIS — E785 Hyperlipidemia, unspecified: Secondary | ICD-10-CM | POA: Diagnosis not present

## 2020-12-01 DIAGNOSIS — D509 Iron deficiency anemia, unspecified: Secondary | ICD-10-CM | POA: Diagnosis not present

## 2020-12-01 DIAGNOSIS — Z853 Personal history of malignant neoplasm of breast: Secondary | ICD-10-CM | POA: Diagnosis not present

## 2020-12-15 DIAGNOSIS — G5603 Carpal tunnel syndrome, bilateral upper limbs: Secondary | ICD-10-CM | POA: Diagnosis not present

## 2020-12-15 DIAGNOSIS — M150 Primary generalized (osteo)arthritis: Secondary | ICD-10-CM | POA: Diagnosis not present

## 2020-12-15 DIAGNOSIS — M5416 Radiculopathy, lumbar region: Secondary | ICD-10-CM | POA: Diagnosis not present

## 2020-12-15 DIAGNOSIS — Z853 Personal history of malignant neoplasm of breast: Secondary | ICD-10-CM | POA: Diagnosis not present

## 2020-12-15 DIAGNOSIS — D509 Iron deficiency anemia, unspecified: Secondary | ICD-10-CM | POA: Diagnosis not present

## 2020-12-15 DIAGNOSIS — G894 Chronic pain syndrome: Secondary | ICD-10-CM | POA: Diagnosis not present

## 2020-12-15 DIAGNOSIS — E785 Hyperlipidemia, unspecified: Secondary | ICD-10-CM | POA: Diagnosis not present

## 2020-12-15 DIAGNOSIS — J4530 Mild persistent asthma, uncomplicated: Secondary | ICD-10-CM | POA: Diagnosis not present

## 2020-12-15 DIAGNOSIS — K219 Gastro-esophageal reflux disease without esophagitis: Secondary | ICD-10-CM | POA: Diagnosis not present

## 2020-12-15 DIAGNOSIS — I10 Essential (primary) hypertension: Secondary | ICD-10-CM | POA: Diagnosis not present

## 2020-12-15 DIAGNOSIS — Z9181 History of falling: Secondary | ICD-10-CM | POA: Diagnosis not present

## 2021-01-01 DIAGNOSIS — G5603 Carpal tunnel syndrome, bilateral upper limbs: Secondary | ICD-10-CM | POA: Diagnosis not present

## 2021-01-01 DIAGNOSIS — Z853 Personal history of malignant neoplasm of breast: Secondary | ICD-10-CM | POA: Diagnosis not present

## 2021-01-01 DIAGNOSIS — I10 Essential (primary) hypertension: Secondary | ICD-10-CM | POA: Diagnosis not present

## 2021-01-01 DIAGNOSIS — Z9181 History of falling: Secondary | ICD-10-CM | POA: Diagnosis not present

## 2021-01-01 DIAGNOSIS — M5416 Radiculopathy, lumbar region: Secondary | ICD-10-CM | POA: Diagnosis not present

## 2021-01-01 DIAGNOSIS — J4530 Mild persistent asthma, uncomplicated: Secondary | ICD-10-CM | POA: Diagnosis not present

## 2021-01-01 DIAGNOSIS — K219 Gastro-esophageal reflux disease without esophagitis: Secondary | ICD-10-CM | POA: Diagnosis not present

## 2021-01-01 DIAGNOSIS — E785 Hyperlipidemia, unspecified: Secondary | ICD-10-CM | POA: Diagnosis not present

## 2021-01-01 DIAGNOSIS — M150 Primary generalized (osteo)arthritis: Secondary | ICD-10-CM | POA: Diagnosis not present

## 2021-01-01 DIAGNOSIS — D509 Iron deficiency anemia, unspecified: Secondary | ICD-10-CM | POA: Diagnosis not present

## 2021-01-01 DIAGNOSIS — G894 Chronic pain syndrome: Secondary | ICD-10-CM | POA: Diagnosis not present

## 2021-01-07 DIAGNOSIS — Z853 Personal history of malignant neoplasm of breast: Secondary | ICD-10-CM | POA: Diagnosis not present

## 2021-01-07 DIAGNOSIS — M150 Primary generalized (osteo)arthritis: Secondary | ICD-10-CM | POA: Diagnosis not present

## 2021-01-07 DIAGNOSIS — M5416 Radiculopathy, lumbar region: Secondary | ICD-10-CM | POA: Diagnosis not present

## 2021-01-07 DIAGNOSIS — Z9181 History of falling: Secondary | ICD-10-CM | POA: Diagnosis not present

## 2021-01-07 DIAGNOSIS — K219 Gastro-esophageal reflux disease without esophagitis: Secondary | ICD-10-CM | POA: Diagnosis not present

## 2021-01-07 DIAGNOSIS — J4530 Mild persistent asthma, uncomplicated: Secondary | ICD-10-CM | POA: Diagnosis not present

## 2021-01-07 DIAGNOSIS — I10 Essential (primary) hypertension: Secondary | ICD-10-CM | POA: Diagnosis not present

## 2021-01-07 DIAGNOSIS — Z7951 Long term (current) use of inhaled steroids: Secondary | ICD-10-CM | POA: Diagnosis not present

## 2021-01-07 DIAGNOSIS — D509 Iron deficiency anemia, unspecified: Secondary | ICD-10-CM | POA: Diagnosis not present

## 2021-01-07 DIAGNOSIS — G894 Chronic pain syndrome: Secondary | ICD-10-CM | POA: Diagnosis not present

## 2021-01-07 DIAGNOSIS — G5603 Carpal tunnel syndrome, bilateral upper limbs: Secondary | ICD-10-CM | POA: Diagnosis not present

## 2021-01-07 DIAGNOSIS — M797 Fibromyalgia: Secondary | ICD-10-CM | POA: Diagnosis not present

## 2021-01-12 DIAGNOSIS — J4530 Mild persistent asthma, uncomplicated: Secondary | ICD-10-CM | POA: Diagnosis not present

## 2021-01-12 DIAGNOSIS — D509 Iron deficiency anemia, unspecified: Secondary | ICD-10-CM | POA: Diagnosis not present

## 2021-01-12 DIAGNOSIS — M150 Primary generalized (osteo)arthritis: Secondary | ICD-10-CM | POA: Diagnosis not present

## 2021-01-12 DIAGNOSIS — M1990 Unspecified osteoarthritis, unspecified site: Secondary | ICD-10-CM | POA: Diagnosis not present

## 2021-01-12 DIAGNOSIS — J4520 Mild intermittent asthma, uncomplicated: Secondary | ICD-10-CM | POA: Diagnosis not present

## 2021-01-12 DIAGNOSIS — E785 Hyperlipidemia, unspecified: Secondary | ICD-10-CM | POA: Diagnosis not present

## 2021-01-12 DIAGNOSIS — I10 Essential (primary) hypertension: Secondary | ICD-10-CM | POA: Diagnosis not present

## 2021-01-12 DIAGNOSIS — K219 Gastro-esophageal reflux disease without esophagitis: Secondary | ICD-10-CM | POA: Diagnosis not present

## 2021-01-21 DIAGNOSIS — G5603 Carpal tunnel syndrome, bilateral upper limbs: Secondary | ICD-10-CM | POA: Diagnosis not present

## 2021-01-21 DIAGNOSIS — M797 Fibromyalgia: Secondary | ICD-10-CM | POA: Diagnosis not present

## 2021-01-21 DIAGNOSIS — I10 Essential (primary) hypertension: Secondary | ICD-10-CM | POA: Diagnosis not present

## 2021-01-21 DIAGNOSIS — K219 Gastro-esophageal reflux disease without esophagitis: Secondary | ICD-10-CM | POA: Diagnosis not present

## 2021-01-21 DIAGNOSIS — Z7951 Long term (current) use of inhaled steroids: Secondary | ICD-10-CM | POA: Diagnosis not present

## 2021-01-21 DIAGNOSIS — G894 Chronic pain syndrome: Secondary | ICD-10-CM | POA: Diagnosis not present

## 2021-01-21 DIAGNOSIS — D509 Iron deficiency anemia, unspecified: Secondary | ICD-10-CM | POA: Diagnosis not present

## 2021-01-21 DIAGNOSIS — Z9181 History of falling: Secondary | ICD-10-CM | POA: Diagnosis not present

## 2021-01-21 DIAGNOSIS — J4530 Mild persistent asthma, uncomplicated: Secondary | ICD-10-CM | POA: Diagnosis not present

## 2021-01-21 DIAGNOSIS — Z853 Personal history of malignant neoplasm of breast: Secondary | ICD-10-CM | POA: Diagnosis not present

## 2021-01-21 DIAGNOSIS — M5416 Radiculopathy, lumbar region: Secondary | ICD-10-CM | POA: Diagnosis not present

## 2021-01-21 DIAGNOSIS — M150 Primary generalized (osteo)arthritis: Secondary | ICD-10-CM | POA: Diagnosis not present

## 2021-01-26 DIAGNOSIS — G5603 Carpal tunnel syndrome, bilateral upper limbs: Secondary | ICD-10-CM | POA: Diagnosis not present

## 2021-01-26 DIAGNOSIS — Z853 Personal history of malignant neoplasm of breast: Secondary | ICD-10-CM | POA: Diagnosis not present

## 2021-01-26 DIAGNOSIS — M5416 Radiculopathy, lumbar region: Secondary | ICD-10-CM | POA: Diagnosis not present

## 2021-01-26 DIAGNOSIS — D509 Iron deficiency anemia, unspecified: Secondary | ICD-10-CM | POA: Diagnosis not present

## 2021-01-26 DIAGNOSIS — Z7951 Long term (current) use of inhaled steroids: Secondary | ICD-10-CM | POA: Diagnosis not present

## 2021-01-26 DIAGNOSIS — G894 Chronic pain syndrome: Secondary | ICD-10-CM | POA: Diagnosis not present

## 2021-01-26 DIAGNOSIS — J4530 Mild persistent asthma, uncomplicated: Secondary | ICD-10-CM | POA: Diagnosis not present

## 2021-01-26 DIAGNOSIS — M797 Fibromyalgia: Secondary | ICD-10-CM | POA: Diagnosis not present

## 2021-01-26 DIAGNOSIS — I10 Essential (primary) hypertension: Secondary | ICD-10-CM | POA: Diagnosis not present

## 2021-01-26 DIAGNOSIS — K219 Gastro-esophageal reflux disease without esophagitis: Secondary | ICD-10-CM | POA: Diagnosis not present

## 2021-01-26 DIAGNOSIS — Z9181 History of falling: Secondary | ICD-10-CM | POA: Diagnosis not present

## 2021-01-26 DIAGNOSIS — M150 Primary generalized (osteo)arthritis: Secondary | ICD-10-CM | POA: Diagnosis not present

## 2021-02-10 DIAGNOSIS — J4520 Mild intermittent asthma, uncomplicated: Secondary | ICD-10-CM | POA: Diagnosis not present

## 2021-02-10 DIAGNOSIS — E785 Hyperlipidemia, unspecified: Secondary | ICD-10-CM | POA: Diagnosis not present

## 2021-02-10 DIAGNOSIS — G894 Chronic pain syndrome: Secondary | ICD-10-CM | POA: Diagnosis not present

## 2021-02-10 DIAGNOSIS — I10 Essential (primary) hypertension: Secondary | ICD-10-CM | POA: Diagnosis not present

## 2021-02-10 DIAGNOSIS — R7989 Other specified abnormal findings of blood chemistry: Secondary | ICD-10-CM | POA: Diagnosis not present

## 2021-02-10 DIAGNOSIS — K219 Gastro-esophageal reflux disease without esophagitis: Secondary | ICD-10-CM | POA: Diagnosis not present

## 2021-02-10 DIAGNOSIS — Z0000 Encounter for general adult medical examination without abnormal findings: Secondary | ICD-10-CM | POA: Diagnosis not present

## 2021-02-10 DIAGNOSIS — D509 Iron deficiency anemia, unspecified: Secondary | ICD-10-CM | POA: Diagnosis not present

## 2021-02-11 DIAGNOSIS — G894 Chronic pain syndrome: Secondary | ICD-10-CM | POA: Diagnosis not present

## 2021-02-11 DIAGNOSIS — J4530 Mild persistent asthma, uncomplicated: Secondary | ICD-10-CM | POA: Diagnosis not present

## 2021-02-11 DIAGNOSIS — D509 Iron deficiency anemia, unspecified: Secondary | ICD-10-CM | POA: Diagnosis not present

## 2021-02-11 DIAGNOSIS — M5416 Radiculopathy, lumbar region: Secondary | ICD-10-CM | POA: Diagnosis not present

## 2021-02-11 DIAGNOSIS — M797 Fibromyalgia: Secondary | ICD-10-CM | POA: Diagnosis not present

## 2021-02-11 DIAGNOSIS — K219 Gastro-esophageal reflux disease without esophagitis: Secondary | ICD-10-CM | POA: Diagnosis not present

## 2021-02-11 DIAGNOSIS — Z853 Personal history of malignant neoplasm of breast: Secondary | ICD-10-CM | POA: Diagnosis not present

## 2021-02-11 DIAGNOSIS — I10 Essential (primary) hypertension: Secondary | ICD-10-CM | POA: Diagnosis not present

## 2021-02-11 DIAGNOSIS — G5603 Carpal tunnel syndrome, bilateral upper limbs: Secondary | ICD-10-CM | POA: Diagnosis not present

## 2021-02-11 DIAGNOSIS — Z7951 Long term (current) use of inhaled steroids: Secondary | ICD-10-CM | POA: Diagnosis not present

## 2021-02-11 DIAGNOSIS — M150 Primary generalized (osteo)arthritis: Secondary | ICD-10-CM | POA: Diagnosis not present

## 2021-02-11 DIAGNOSIS — Z9181 History of falling: Secondary | ICD-10-CM | POA: Diagnosis not present

## 2021-02-23 DIAGNOSIS — I10 Essential (primary) hypertension: Secondary | ICD-10-CM | POA: Diagnosis not present

## 2021-02-23 DIAGNOSIS — Z7951 Long term (current) use of inhaled steroids: Secondary | ICD-10-CM | POA: Diagnosis not present

## 2021-02-23 DIAGNOSIS — G894 Chronic pain syndrome: Secondary | ICD-10-CM | POA: Diagnosis not present

## 2021-02-23 DIAGNOSIS — Z9181 History of falling: Secondary | ICD-10-CM | POA: Diagnosis not present

## 2021-02-23 DIAGNOSIS — M150 Primary generalized (osteo)arthritis: Secondary | ICD-10-CM | POA: Diagnosis not present

## 2021-02-23 DIAGNOSIS — G5603 Carpal tunnel syndrome, bilateral upper limbs: Secondary | ICD-10-CM | POA: Diagnosis not present

## 2021-02-23 DIAGNOSIS — K219 Gastro-esophageal reflux disease without esophagitis: Secondary | ICD-10-CM | POA: Diagnosis not present

## 2021-02-23 DIAGNOSIS — M5416 Radiculopathy, lumbar region: Secondary | ICD-10-CM | POA: Diagnosis not present

## 2021-02-23 DIAGNOSIS — D509 Iron deficiency anemia, unspecified: Secondary | ICD-10-CM | POA: Diagnosis not present

## 2021-02-23 DIAGNOSIS — Z853 Personal history of malignant neoplasm of breast: Secondary | ICD-10-CM | POA: Diagnosis not present

## 2021-02-23 DIAGNOSIS — M797 Fibromyalgia: Secondary | ICD-10-CM | POA: Diagnosis not present

## 2021-02-23 DIAGNOSIS — J4530 Mild persistent asthma, uncomplicated: Secondary | ICD-10-CM | POA: Diagnosis not present

## 2022-04-11 DIAGNOSIS — E785 Hyperlipidemia, unspecified: Secondary | ICD-10-CM | POA: Diagnosis not present

## 2022-04-11 DIAGNOSIS — M797 Fibromyalgia: Secondary | ICD-10-CM | POA: Diagnosis not present

## 2022-04-11 DIAGNOSIS — M1990 Unspecified osteoarthritis, unspecified site: Secondary | ICD-10-CM | POA: Diagnosis not present

## 2022-04-11 DIAGNOSIS — J4520 Mild intermittent asthma, uncomplicated: Secondary | ICD-10-CM | POA: Diagnosis not present

## 2022-04-11 DIAGNOSIS — K219 Gastro-esophageal reflux disease without esophagitis: Secondary | ICD-10-CM | POA: Diagnosis not present

## 2022-04-11 DIAGNOSIS — Z5181 Encounter for therapeutic drug level monitoring: Secondary | ICD-10-CM | POA: Diagnosis not present

## 2022-04-11 DIAGNOSIS — I10 Essential (primary) hypertension: Secondary | ICD-10-CM | POA: Diagnosis not present

## 2022-04-12 ENCOUNTER — Other Ambulatory Visit: Payer: Self-pay | Admitting: Physician Assistant

## 2022-04-12 DIAGNOSIS — G8929 Other chronic pain: Secondary | ICD-10-CM

## 2022-04-12 DIAGNOSIS — M5441 Lumbago with sciatica, right side: Secondary | ICD-10-CM

## 2022-09-28 DIAGNOSIS — C50912 Malignant neoplasm of unspecified site of left female breast: Secondary | ICD-10-CM | POA: Diagnosis not present

## 2022-10-20 DIAGNOSIS — C50912 Malignant neoplasm of unspecified site of left female breast: Secondary | ICD-10-CM | POA: Diagnosis not present

## 2023-03-22 DIAGNOSIS — E785 Hyperlipidemia, unspecified: Secondary | ICD-10-CM | POA: Diagnosis not present

## 2023-03-22 DIAGNOSIS — I10 Essential (primary) hypertension: Secondary | ICD-10-CM | POA: Diagnosis not present

## 2023-03-22 DIAGNOSIS — M797 Fibromyalgia: Secondary | ICD-10-CM | POA: Diagnosis not present

## 2023-03-22 DIAGNOSIS — J4520 Mild intermittent asthma, uncomplicated: Secondary | ICD-10-CM | POA: Diagnosis not present

## 2023-03-22 DIAGNOSIS — Z0000 Encounter for general adult medical examination without abnormal findings: Secondary | ICD-10-CM | POA: Diagnosis not present

## 2023-03-24 ENCOUNTER — Encounter: Payer: Self-pay | Admitting: Hematology and Oncology

## 2023-04-11 DIAGNOSIS — C50912 Malignant neoplasm of unspecified site of left female breast: Secondary | ICD-10-CM | POA: Diagnosis not present

## 2023-04-12 ENCOUNTER — Encounter: Payer: Self-pay | Admitting: Hematology and Oncology

## 2023-04-19 ENCOUNTER — Inpatient Hospital Stay: Payer: 59

## 2023-04-19 ENCOUNTER — Inpatient Hospital Stay: Payer: 59 | Attending: Hematology and Oncology | Admitting: Hematology and Oncology

## 2023-04-19 NOTE — Assessment & Plan Note (Deleted)
 Lab review: 03/22/2023: Hemoglobin 10.5, MCV 84.6, WBC 12.7, ANC 10.7, ALC 1.3 platelets 409 Leukocytosis: Most likely secondary to underlying inflammation because these are mostly neutrophils.  There is no concern for myeloproliferative disorders based on the differential. Normocytic anemia: Will check iron and B12 and folic acid levels as well as SPEP and DAT with haptoglobin and LDH.

## 2023-04-19 NOTE — Assessment & Plan Note (Deleted)
 left lumpectomy 01/28/2015: DCIS 1.8 cm, margins negative, closest margin 0.3 cm, ALH,fibrocystic changes with calcifications, Tis N0 stage 0, ER 100%, PR 100% low to intermediate grade Completed adjuvant radiation therapy 04/08/2015 to 05/18/2015 (Dr.Squire)   Current treatment: Antiestrogen therapy with tamoxifen 5 years started May 2017   Tamoxifen toxicities: 1. 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   Breast cancer surveillance: 1.  Breast exam 02/22/2018: Benign

## 2023-04-26 ENCOUNTER — Telehealth: Payer: Self-pay | Admitting: Physician Assistant

## 2023-04-26 ENCOUNTER — Other Ambulatory Visit: Payer: 59

## 2023-04-26 NOTE — Telephone Encounter (Signed)
 Scheduled appointments per referral. Patient is aware of the appointment time and date as well as the address. Patient was informed to arrive 10-15 minutes prior with updated insurance information. All questions were answered.

## 2023-05-10 ENCOUNTER — Other Ambulatory Visit: Payer: 59

## 2023-05-10 ENCOUNTER — Ambulatory Visit: Payer: 59 | Admitting: Physician Assistant

## 2023-06-02 ENCOUNTER — Telehealth: Payer: Self-pay | Admitting: Hematology and Oncology

## 2023-06-02 NOTE — Telephone Encounter (Signed)
 Rescheduled appointments per patients request via incoming call. Talked with the patient and she is aware of the changes made to her upcoming appointments.

## 2023-06-05 ENCOUNTER — Encounter: Payer: 59 | Admitting: Hematology and Oncology

## 2023-06-05 ENCOUNTER — Other Ambulatory Visit: Payer: 59

## 2023-06-28 ENCOUNTER — Inpatient Hospital Stay: Attending: Hematology and Oncology | Admitting: Hematology and Oncology

## 2023-06-28 ENCOUNTER — Inpatient Hospital Stay

## 2023-06-28 VITALS — BP 129/62 | HR 83 | Temp 98.6°F | Resp 18 | Wt 330.0 lb

## 2023-06-28 DIAGNOSIS — D509 Iron deficiency anemia, unspecified: Secondary | ICD-10-CM

## 2023-06-28 DIAGNOSIS — Z923 Personal history of irradiation: Secondary | ICD-10-CM | POA: Insufficient documentation

## 2023-06-28 DIAGNOSIS — Z832 Family history of diseases of the blood and blood-forming organs and certain disorders involving the immune mechanism: Secondary | ICD-10-CM | POA: Diagnosis not present

## 2023-06-28 DIAGNOSIS — C50312 Malignant neoplasm of lower-inner quadrant of left female breast: Secondary | ICD-10-CM

## 2023-06-28 DIAGNOSIS — G8929 Other chronic pain: Secondary | ICD-10-CM | POA: Diagnosis not present

## 2023-06-28 DIAGNOSIS — R5383 Other fatigue: Secondary | ICD-10-CM | POA: Diagnosis not present

## 2023-06-28 DIAGNOSIS — Z86000 Personal history of in-situ neoplasm of breast: Secondary | ICD-10-CM | POA: Diagnosis not present

## 2023-06-28 LAB — CBC WITH DIFFERENTIAL (CANCER CENTER ONLY)
Abs Immature Granulocytes: 0.05 10*3/uL (ref 0.00–0.07)
Basophils Absolute: 0.1 10*3/uL (ref 0.0–0.1)
Basophils Relative: 0 %
Eosinophils Absolute: 0.2 10*3/uL (ref 0.0–0.5)
Eosinophils Relative: 1 %
HCT: 33.7 % — ABNORMAL LOW (ref 36.0–46.0)
Hemoglobin: 10 g/dL — ABNORMAL LOW (ref 12.0–15.0)
Immature Granulocytes: 0 %
Lymphocytes Relative: 15 %
Lymphs Abs: 1.9 10*3/uL (ref 0.7–4.0)
MCH: 25.7 pg — ABNORMAL LOW (ref 26.0–34.0)
MCHC: 29.7 g/dL — ABNORMAL LOW (ref 30.0–36.0)
MCV: 86.6 fL (ref 80.0–100.0)
Monocytes Absolute: 0.6 10*3/uL (ref 0.1–1.0)
Monocytes Relative: 5 %
Neutro Abs: 9.8 10*3/uL — ABNORMAL HIGH (ref 1.7–7.7)
Neutrophils Relative %: 79 %
Platelet Count: 418 10*3/uL — ABNORMAL HIGH (ref 150–400)
RBC: 3.89 MIL/uL (ref 3.87–5.11)
RDW: 17.4 % — ABNORMAL HIGH (ref 11.5–15.5)
WBC Count: 12.6 10*3/uL — ABNORMAL HIGH (ref 4.0–10.5)
nRBC: 0 % (ref 0.0–0.2)

## 2023-06-28 LAB — IRON AND IRON BINDING CAPACITY (CC-WL,HP ONLY)
Iron: 31 ug/dL (ref 28–170)
Saturation Ratios: 10 % — ABNORMAL LOW (ref 10.4–31.8)
TIBC: 323 ug/dL (ref 250–450)
UIBC: 292 ug/dL (ref 148–442)

## 2023-06-28 LAB — VITAMIN B12: Vitamin B-12: 171 pg/mL — ABNORMAL LOW (ref 180–914)

## 2023-06-28 LAB — FOLATE: Folate: 5 ng/mL — ABNORMAL LOW (ref >5.9)

## 2023-06-28 LAB — FERRITIN: Ferritin: 117 ng/mL (ref 11–307)

## 2023-06-28 NOTE — Progress Notes (Signed)
 Patient Care Team: Pa, [facility] as PCP - General (Family Medicine) Caralyn Chandler, MD as Consulting Physician (General Surgery) Cameron Cea, MD as Consulting Physician (Hematology and Oncology) Opal Bill, MD (Inactive) as Consulting Physician (Radiation Oncology) Auther Bo, RN as Registered Nurse Alane Hsu, RN as Registered Nurse Dama Duffel, NP as Nurse Practitioner (Nurse Practitioner)  DIAGNOSIS:  Encounter Diagnoses  Name Primary?   Primary cancer of lower-inner quadrant of left female breast (HCC) Yes   Iron deficiency anemia, unspecified iron deficiency anemia type     SUMMARY OF ONCOLOGIC HISTORY: Oncology History  Primary cancer of lower-inner quadrant of left female breast (HCC)  11/26/2014 Mammogram   Left breast: 8:00 calcifications spanning 5 mm   12/02/2014 Initial Diagnosis   Left breast biopsy: Low to intermediate grade DCIS with calcifications, ER+ 100%, PR+ 100%   12/02/2014 Clinical Stage   Stage 0: Tis N0   12/11/2014 Procedure   Breast/Ovarian panel revealed no deleterious mutations at ATM, BARD1, BRCA1, BRCA2, BRIP1, CDH1, CHEK2, FANCC, MLH1, MSH2, MSH6, NBN, PALB2, PMS2, PTEN, RAD51C, RAD51D, TP53, and XRCC2.    01/28/2015 Surgery   Left lumpectomy: DCIS 1.8 cm, margins negative, closest margin 0.3 cm, ALH,fibrocystic changes with calcifications, ER+ 100%, PR+ 100% low to intermediate grade   01/28/2015 Pathologic Stage   Stage 0: Tis Nx   04/08/2015 - 05/18/2015 Radiation Therapy   Adjuvant radiation therapy (Dr.Squire): Left breast / 50.4 Gy in 28 fractions   07/14/2015 -  Anti-estrogen oral therapy   Tamoxifen  20 mg daily to begin ~07/14/2015. Planned duration of therapy 5 years.   08/05/2015 Survivorship   Survivorship care plan mailed to patient in lieu of in person visit     CHIEF COMPLIANT: Follow-up regarding anemia  HISTORY OF PRESENT ILLNESS:  History of Present Illness The patient,  with a history of anemia and iron deficiency, presents with chronic fatigue. The patient reports feeling tired for a long time, to the point of struggling to keep her eyes open. The patient has a history of heavy nosebleeds from childhood to adolescence, which may have contributed to her anemia. The patient's hemoglobin levels have been stable for the past six years, suggesting that the anemia is not worsening. The patient also reports chronic pain in various parts of the body, including the back, knees, shoulders, and hips. The patient is also dealing with weight issues and is planning to get a sleep test for sleep apnea. The patient's granddaughter also has iron deficiency and is receiving iron infusions.     ALLERGIES:  is allergic to ace inhibitors, other, decongestant [pseudoephedrine hcl], and nsaids.  MEDICATIONS:  Current Outpatient Medications  Medication Sig Dispense Refill   ABRYSVO 120 MCG/0.5ML injection 0.5 mLs.     amLODipine  (NORVASC ) 10 MG tablet Take 1 tablet (10 mg total) by mouth daily. Need annual appointment for further refills 90 tablet 0   ARIPiprazole  (ABILIFY ) 2 MG tablet Take 1 tablet (2 mg total) by mouth daily. She will be transferred to another provider. Please do not send us  refill request. 90 tablet 0   BREO ELLIPTA  100-25 MCG/INH AEPB USE 1 INHALATION DAILY 180 each 0   carvedilol  (COREG ) 6.25 MG tablet Take 1 tablet by mouth twice daily 180 tablet 1   escitalopram  (LEXAPRO ) 10 MG tablet Take 1 tablet (10 mg total) by mouth daily. 90 tablet 0   fexofenadine  (ALLEGRA ) 180 MG tablet Take 1 tablet (180 mg total) by mouth daily.  For seasonal allergies.     fluticasone  (FLONASE ) 50 MCG/ACT nasal spray USE 2 SPRAYS INTO BOTH  NOSTRILS DAILY FOR SEASONAL ALLERGIES. 48 g 3   Ginger, Zingiber officinalis, (GINGER PO) Take by mouth.     hydrOXYzine  (VISTARIL ) 25 MG capsule Take 1 capsule (25 mg total) by mouth daily as needed for anxiety. She will be transferred to another  provider. Please do not send us  refill request. 90 capsule 0   lithium  carbonate (LITHOBID ) 300 MG CR tablet Take 1 tablet (300 mg total) by mouth 2 (two) times daily. 180 tablet 0   mineral oil liquid Take 15 mLs by mouth daily as needed for moderate constipation.     montelukast  (SINGULAIR ) 10 MG tablet Take 1 tablet (10 mg total) by mouth at bedtime. Need annual appointment for further refills 90 tablet 0   MYRBETRIQ 50 MG TB24 tablet Take 50 mg by mouth daily.     non-metallic deodorant (ALRA) MISC Apply 1 application topically daily as needed. Reported on 06/19/2015     pantoprazole  (PROTONIX ) 40 MG tablet Take 1 tablet (40 mg total) by mouth daily. Need annual appointment for further refills 90 tablet 0   PROAIR  HFA 108 (90 Base) MCG/ACT inhaler USE 1 PUFF EVERY 4 HOURS AS NEEDED FOR WHEEZING OR  SHORTNESS OF BREATH. 25.5 g 0   Turmeric Curcumin 500 MG CAPS Take by mouth.     acetaminophen  (TYLENOL ) 650 MG CR tablet Take 650 mg by mouth every 8 (eight) hours as needed for pain. (Patient not taking: Reported on 06/28/2023)     No current facility-administered medications for this visit.    PHYSICAL EXAMINATION: ECOG PERFORMANCE STATUS: 1 - Symptomatic but completely ambulatory  Vitals:   06/28/23 1140  BP: 129/62  Pulse: 83  Resp: 18  Temp: 98.6 F (37 C)  SpO2: 97%   Filed Weights   06/28/23 1140  Weight: (!) 330 lb (149.7 kg)    LABORATORY DATA:  I have reviewed the data as listed    Latest Ref Rng & Units 03/02/2017    4:20 PM 06/23/2016   10:16 AM 01/21/2015   12:00 PM  CMP  Glucose 65 - 139 mg/dL 79  161  84   BUN 7 - 25 mg/dL 16  13  10    Creatinine 0.50 - 0.99 mg/dL 0.96  0.45  4.09   Sodium 135 - 146 mmol/L 141  142  140   Potassium 3.5 - 5.3 mmol/L 3.9  3.2  4.2   Chloride 98 - 110 mmol/L 108  108  107   CO2 20 - 32 mmol/L 26  25  26    Calcium 8.6 - 10.4 mg/dL 9.3  8.9  9.4   Total Protein 6.1 - 8.1 g/dL 7.2  7.7    Total Bilirubin 0.2 - 1.2 mg/dL 0.2  0.3     Alkaline Phos 39 - 117 U/L  96    AST 10 - 35 U/L 16  12    ALT 6 - 29 U/L 25  13      Lab Results  Component Value Date   WBC 12.6 (H) 06/28/2023   HGB 10.0 (L) 06/28/2023   HCT 33.7 (L) 06/28/2023   MCV 86.6 06/28/2023   PLT 418 (H) 06/28/2023   NEUTROABS 9.8 (H) 06/28/2023    ASSESSMENT & PLAN:  Primary cancer of lower-inner quadrant of left female breast (HCC) left lumpectomy 01/28/2015: DCIS 1.8 cm, margins negative, closest margin 0.3 cm, ALH,fibrocystic  changes with calcifications, Tis N0 stage 0, ER 100%, PR 100% low to intermediate grade Completed adjuvant radiation therapy 04/08/2015 to 05/18/2015 (Dr.Squire) Patient was lost to follow-up and has not had any mammograms since.  I discussed with her about getting another mammogram.  Iron deficiency anemia H/O Iron def anemia IV Iron: 02/2018  Lab Review: Over the past 6 years her hemoglobin has been between 10.2-10.5. 06/28/2023: Hemoglobin 10, MCV 86.6, RDW 17.4, B12 171, iron saturation 10%, ferritin pending  I will call her tomorrow with the result of this test but she certainly would benefit from B12 replacement with weekly x 4 followed by monthly thereafter ------------------------------------- Assessment and Plan Assessment & Plan Mild anemia Mild anemia with stable hemoglobin at 10.5 g/dL. Fatigue likely not solely due to anemia. Differential includes anemia of chronic disease. No active bleeding or significant hemoglobin drop. Consider other fatigue causes like nutritional deficiencies and chronic health issues. - Check iron levels, B12, and folic acid . - Evaluate need for iron infusion based on results. - Encourage gradual increase in physical activity.  Chronic pain Chronic pain in back, knees, shoulders, and hips. Pain may contribute to fatigue and decreased activity. Weight loss and increased activity may improve pain and mobility. - Discuss weight loss medications with primary care provider, pending sleep  apnea evaluation. - Encourage gradual increase in physical activity.      Orders Placed This Encounter  Procedures   CBC with Differential (Cancer Center Only)    Standing Status:   Future    Number of Occurrences:   1    Expiration Date:   06/27/2024   Ferritin    Standing Status:   Future    Number of Occurrences:   1    Expiration Date:   06/27/2024   Iron and Iron Binding Capacity ([facility] only)    Standing Status:   Future    Number of Occurrences:   1    Expiration Date:   06/27/2024   Vitamin B12    Standing Status:   Future    Number of Occurrences:   1    Expiration Date:   06/27/2024   Folate    Standing Status:   Future    Number of Occurrences:   1    Expiration Date:   06/27/2024   The patient has a good understanding of the overall plan. she agrees with it. she will call with any problems that may develop before the next visit here. Total time spent: 30 mins including face to face time and time spent for planning, charting and co-ordination of care   Viinay K Elika Godar, MD 06/28/23

## 2023-06-28 NOTE — Assessment & Plan Note (Signed)
 H/O Iron def anemia IV Iron: 02/2018  Lab Review:

## 2023-06-28 NOTE — Assessment & Plan Note (Signed)
 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   Breast cancer surveillance: 1.  Mammogram  not record

## 2023-06-29 ENCOUNTER — Telehealth: Payer: Self-pay | Admitting: Hematology and Oncology

## 2023-06-29 ENCOUNTER — Other Ambulatory Visit: Payer: Self-pay | Admitting: Hematology and Oncology

## 2023-06-29 DIAGNOSIS — D509 Iron deficiency anemia, unspecified: Secondary | ICD-10-CM

## 2023-06-29 NOTE — Telephone Encounter (Signed)
 Discussed with patient that she ahs B 12, folate and mild iron def Recommendation:  Oral B 12 : 5000 mcg dissolving tav Low folate: advised a MVT Low Iron sat: increase iron containing foods  Recheck labs in 3 months and follow up.

## 2023-07-03 ENCOUNTER — Telehealth: Payer: Self-pay | Admitting: Hematology and Oncology

## 2023-07-03 NOTE — Telephone Encounter (Signed)
 Confirmed with pt scheduled appt date and time

## 2023-07-05 DIAGNOSIS — G4733 Obstructive sleep apnea (adult) (pediatric): Secondary | ICD-10-CM | POA: Diagnosis not present

## 2023-07-27 DIAGNOSIS — G4733 Obstructive sleep apnea (adult) (pediatric): Secondary | ICD-10-CM | POA: Diagnosis not present

## 2023-08-09 DIAGNOSIS — G4733 Obstructive sleep apnea (adult) (pediatric): Secondary | ICD-10-CM | POA: Diagnosis not present

## 2023-10-02 ENCOUNTER — Inpatient Hospital Stay: Admitting: Hematology and Oncology

## 2023-10-02 ENCOUNTER — Inpatient Hospital Stay

## 2023-10-25 DIAGNOSIS — G4733 Obstructive sleep apnea (adult) (pediatric): Secondary | ICD-10-CM | POA: Diagnosis not present

## 2023-11-08 ENCOUNTER — Inpatient Hospital Stay: Admitting: Hematology and Oncology

## 2023-11-08 ENCOUNTER — Inpatient Hospital Stay

## 2023-11-08 NOTE — Assessment & Plan Note (Deleted)
 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  I discussed with her about getting another mammogram.   Iron deficiency anemia H/O Iron def anemia IV Iron: 02/2018   Lab Review: Over the past 6 years her hemoglobin has been between 10.2-10.5. 06/28/2023: Hemoglobin 10, MCV 86.6, RDW 17.4, B12 171, iron saturation 10%, ferritin 117   Recommendation:  Oral B 12 : 5000 mcg dissolving tav Low folate: advised a MVT Low Iron sat: increase iron containing foods

## 2023-11-15 ENCOUNTER — Inpatient Hospital Stay: Attending: Hematology and Oncology

## 2023-11-15 DIAGNOSIS — Z79899 Other long term (current) drug therapy: Secondary | ICD-10-CM | POA: Diagnosis not present

## 2023-11-15 DIAGNOSIS — Z853 Personal history of malignant neoplasm of breast: Secondary | ICD-10-CM | POA: Diagnosis not present

## 2023-11-15 DIAGNOSIS — D509 Iron deficiency anemia, unspecified: Secondary | ICD-10-CM | POA: Insufficient documentation

## 2023-11-15 LAB — CBC WITH DIFFERENTIAL (CANCER CENTER ONLY)
Abs Immature Granulocytes: 0.08 K/uL — ABNORMAL HIGH (ref 0.00–0.07)
Basophils Absolute: 0.1 K/uL (ref 0.0–0.1)
Basophils Relative: 1 %
Eosinophils Absolute: 0.2 K/uL (ref 0.0–0.5)
Eosinophils Relative: 2 %
HCT: 33.6 % — ABNORMAL LOW (ref 36.0–46.0)
Hemoglobin: 10 g/dL — ABNORMAL LOW (ref 12.0–15.0)
Immature Granulocytes: 1 %
Lymphocytes Relative: 21 %
Lymphs Abs: 2 K/uL (ref 0.7–4.0)
MCH: 25.2 pg — ABNORMAL LOW (ref 26.0–34.0)
MCHC: 29.8 g/dL — ABNORMAL LOW (ref 30.0–36.0)
MCV: 84.6 fL (ref 80.0–100.0)
Monocytes Absolute: 0.6 K/uL (ref 0.1–1.0)
Monocytes Relative: 7 %
Neutro Abs: 6.7 K/uL (ref 1.7–7.7)
Neutrophils Relative %: 68 %
Platelet Count: 360 K/uL (ref 150–400)
RBC: 3.97 MIL/uL (ref 3.87–5.11)
RDW: 17.4 % — ABNORMAL HIGH (ref 11.5–15.5)
WBC Count: 9.7 K/uL (ref 4.0–10.5)
nRBC: 0 % (ref 0.0–0.2)

## 2023-11-15 LAB — IRON AND IRON BINDING CAPACITY (CC-WL,HP ONLY)
Iron: 33 ug/dL (ref 28–170)
Saturation Ratios: 10 % — ABNORMAL LOW (ref 10.4–31.8)
TIBC: 318 ug/dL (ref 250–450)
UIBC: 285 ug/dL (ref 148–442)

## 2023-11-15 LAB — FOLATE: Folate: 5.7 ng/mL — ABNORMAL LOW (ref >5.9)

## 2023-11-15 LAB — FERRITIN: Ferritin: 104 ng/mL (ref 11–307)

## 2023-11-15 LAB — VITAMIN B12: Vitamin B-12: 456 pg/mL (ref 180–914)

## 2023-11-16 ENCOUNTER — Inpatient Hospital Stay (HOSPITAL_BASED_OUTPATIENT_CLINIC_OR_DEPARTMENT_OTHER): Admitting: Hematology and Oncology

## 2023-11-16 DIAGNOSIS — C50312 Malignant neoplasm of lower-inner quadrant of left female breast: Secondary | ICD-10-CM

## 2023-11-16 DIAGNOSIS — Z923 Personal history of irradiation: Secondary | ICD-10-CM | POA: Diagnosis not present

## 2023-11-16 DIAGNOSIS — Z853 Personal history of malignant neoplasm of breast: Secondary | ICD-10-CM | POA: Diagnosis not present

## 2023-11-16 DIAGNOSIS — D509 Iron deficiency anemia, unspecified: Secondary | ICD-10-CM | POA: Diagnosis not present

## 2023-11-16 NOTE — Assessment & Plan Note (Signed)
 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  I discussed with her about getting another mammogram.

## 2023-11-16 NOTE — Assessment & Plan Note (Signed)
 Mild anemia Mild anemia with stable hemoglobin at 10.5 g/dL. Fatigue likely not solely due to anemia. Differential includes anemia of chronic disease. No active bleeding or significant hemoglobin drop. Consider other fatigue causes like nutritional deficiencies and chronic health issues.  Lab review: 11/15/2023: Hemoglobin 10, MCV 84.6, iron saturation 10%, ferritin 104, B12 456, folate 5.7 Recommendation: Folate supplementation  Recheck lab 3 months and follow-up

## 2023-11-16 NOTE — Progress Notes (Signed)
 HEMATOLOGY-ONCOLOGY TELEPHONE VISIT PROGRESS NOTE  I connected with our patient on 11/16/23 at  1:30 PM EDT by telephone and verified that I am speaking with the correct person using two identifiers.  I discussed the limitations, risks, security and privacy concerns of performing an evaluation and management service by telephone and the availability of in person appointments.  I also discussed with the patient that there may be a patient responsible charge related to this service. The patient expressed understanding and agreed to proceed.   History of Present Illness:    History of Present Illness Caitlyn Bautista is a 73 year old female who presents for follow-up of her lab results related to anemia.  She has anemia with a hemoglobin level consistently at 10 g/dL for the past four months. Recent labs show low iron saturation with adequate ferritin levels.  She takes B12 supplements, which have improved her B12 levels, and folic acid  at a dose of 400 mcg. She started this regimen on May 3rd. She takes iron supplements at a dose of 65 mg but has stopped due to constipation.  To manage her iron levels, she consumes iron-rich foods such as spinach, liver, and beets, and occasionally drinks [employer] Shepherd wine for its medicinal properties.    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 Planned duration of therapy 5 years.   08/05/2015 Survivorship   Survivorship care plan mailed to patient in lieu of in person visit     REVIEW OF SYSTEMS:   Constitutional: Denies fevers, chills or abnormal weight loss All other systems were reviewed with the patient and are negative. 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  I discussed with her about getting another mammogram.  Iron deficiency anemia Mild anemia Mild anemia with stable hemoglobin at 10 g/dL. Fatigue likely not solely due to anemia. Differential includes anemia of chronic disease. No active bleeding or significant hemoglobin drop.   Lab review: 11/15/2023: Hemoglobin 10, MCV 84.6, iron saturation 10%, ferritin 104, B12 456, folate 5.7 Recommendation: Folate supplementation increased to 800 mcg daily  Recheck lab 3 months and follow-up      I discussed the assessment and treatment plan with the patient. The patient was provided  an opportunity to ask questions and all were answered. The patient agreed with the plan and demonstrated an understanding of the instructions. The patient was advised to call back or seek an in-person evaluation if the symptoms worsen or if the condition fails to improve as anticipated.    I provided 20 minutes of non-face-to-face time during this encounter.  This includes time for charting and coordination of care   Naomi MARLA Chad, MD

## 2023-11-29 DIAGNOSIS — G5600 Carpal tunnel syndrome, unspecified upper limb: Secondary | ICD-10-CM | POA: Diagnosis not present

## 2023-11-29 DIAGNOSIS — M25561 Pain in right knee: Secondary | ICD-10-CM | POA: Diagnosis not present

## 2023-11-29 DIAGNOSIS — K219 Gastro-esophageal reflux disease without esophagitis: Secondary | ICD-10-CM | POA: Diagnosis not present

## 2023-11-29 DIAGNOSIS — D649 Anemia, unspecified: Secondary | ICD-10-CM | POA: Diagnosis not present

## 2023-11-29 DIAGNOSIS — N3281 Overactive bladder: Secondary | ICD-10-CM | POA: Diagnosis not present

## 2023-11-29 DIAGNOSIS — E785 Hyperlipidemia, unspecified: Secondary | ICD-10-CM | POA: Diagnosis not present

## 2023-11-29 DIAGNOSIS — J45909 Unspecified asthma, uncomplicated: Secondary | ICD-10-CM | POA: Diagnosis not present

## 2023-11-29 DIAGNOSIS — I10 Essential (primary) hypertension: Secondary | ICD-10-CM | POA: Diagnosis not present

## 2023-11-29 DIAGNOSIS — G4730 Sleep apnea, unspecified: Secondary | ICD-10-CM | POA: Diagnosis not present

## 2024-01-24 NOTE — Progress Notes (Signed)
 Caitlyn Bautista                                          MRN: [id number]   01/24/2024   The [facility] Quality Team Specialist reviewed this patient medical record for the purposes of chart review for care gap closure. The following were reviewed: chart review for care gap closure-breast cancer screening.    [facility] Quality Team

## 2024-02-21 ENCOUNTER — Inpatient Hospital Stay

## 2024-02-26 ENCOUNTER — Inpatient Hospital Stay: Admitting: Hematology and Oncology

## 2024-04-03 ENCOUNTER — Inpatient Hospital Stay

## 2024-04-08 ENCOUNTER — Inpatient Hospital Stay: Admitting: Hematology and Oncology

## 2024-05-01 ENCOUNTER — Inpatient Hospital Stay

## 2024-05-07 ENCOUNTER — Inpatient Hospital Stay: Admitting: Hematology and Oncology
